# Patient Record
Sex: Female | Born: 1975 | Race: White | Hispanic: No | Marital: Married | Smoking: Current every day smoker
Health system: Southern US, Community
[De-identification: ages and names within clinical notes are randomized; demographics above are authoritative.]

## PROBLEM LIST (undated history)

## (undated) DIAGNOSIS — J45909 Unspecified asthma, uncomplicated: Secondary | ICD-10-CM

## (undated) DIAGNOSIS — F429 Obsessive-compulsive disorder, unspecified: Secondary | ICD-10-CM

## (undated) DIAGNOSIS — G43909 Migraine, unspecified, not intractable, without status migrainosus: Secondary | ICD-10-CM

## (undated) DIAGNOSIS — M545 Low back pain, unspecified: Secondary | ICD-10-CM

## (undated) DIAGNOSIS — F329 Major depressive disorder, single episode, unspecified: Secondary | ICD-10-CM

## (undated) DIAGNOSIS — M1990 Unspecified osteoarthritis, unspecified site: Secondary | ICD-10-CM

## (undated) DIAGNOSIS — M549 Dorsalgia, unspecified: Secondary | ICD-10-CM

## (undated) DIAGNOSIS — F419 Anxiety disorder, unspecified: Secondary | ICD-10-CM

## (undated) DIAGNOSIS — D649 Anemia, unspecified: Secondary | ICD-10-CM

## (undated) DIAGNOSIS — F909 Attention-deficit hyperactivity disorder, unspecified type: Secondary | ICD-10-CM

## (undated) DIAGNOSIS — D759 Disease of blood and blood-forming organs, unspecified: Secondary | ICD-10-CM

## (undated) DIAGNOSIS — F32A Depression, unspecified: Secondary | ICD-10-CM

## (undated) DIAGNOSIS — F319 Bipolar disorder, unspecified: Secondary | ICD-10-CM

## (undated) DIAGNOSIS — K760 Fatty (change of) liver, not elsewhere classified: Secondary | ICD-10-CM

## (undated) DIAGNOSIS — J449 Chronic obstructive pulmonary disease, unspecified: Secondary | ICD-10-CM

## (undated) DIAGNOSIS — M4802 Spinal stenosis, cervical region: Secondary | ICD-10-CM

## (undated) HISTORY — PX: BREAST BIOPSY: SHX20

## (undated) HISTORY — DX: Attention-deficit hyperactivity disorder, unspecified type: F90.9

## (undated) HISTORY — PX: FOOT SURGERY: SHX648

## (undated) HISTORY — PX: TUBAL LIGATION: SHX77

## (undated) HISTORY — PX: NECK SURGERY: SHX720

## (undated) HISTORY — PX: TONSILLECTOMY: SUR1361

---

## 2002-09-18 HISTORY — PX: TUBAL LIGATION: SHX77

## 2005-04-23 ENCOUNTER — Emergency Department (HOSPITAL_COMMUNITY): Admission: EM | Admit: 2005-04-23 | Discharge: 2005-04-23 | Payer: Self-pay | Admitting: Emergency Medicine

## 2006-08-03 ENCOUNTER — Emergency Department (HOSPITAL_COMMUNITY): Admission: EM | Admit: 2006-08-03 | Discharge: 2006-08-03 | Payer: Self-pay | Admitting: Family Medicine

## 2006-08-12 ENCOUNTER — Emergency Department (HOSPITAL_COMMUNITY): Admission: EM | Admit: 2006-08-12 | Discharge: 2006-08-12 | Payer: Self-pay | Admitting: Family Medicine

## 2006-09-20 ENCOUNTER — Emergency Department (HOSPITAL_COMMUNITY): Admission: EM | Admit: 2006-09-20 | Discharge: 2006-09-20 | Payer: Self-pay | Admitting: Emergency Medicine

## 2007-01-10 ENCOUNTER — Ambulatory Visit: Payer: Self-pay | Admitting: Internal Medicine

## 2007-02-06 ENCOUNTER — Encounter: Payer: Self-pay | Admitting: Internal Medicine

## 2007-02-06 ENCOUNTER — Other Ambulatory Visit: Admission: RE | Admit: 2007-02-06 | Discharge: 2007-02-06 | Payer: Self-pay | Admitting: Internal Medicine

## 2007-02-06 ENCOUNTER — Ambulatory Visit: Payer: Self-pay | Admitting: Internal Medicine

## 2007-06-13 DIAGNOSIS — G43809 Other migraine, not intractable, without status migrainosus: Secondary | ICD-10-CM | POA: Insufficient documentation

## 2007-06-13 DIAGNOSIS — Z9189 Other specified personal risk factors, not elsewhere classified: Secondary | ICD-10-CM | POA: Insufficient documentation

## 2007-08-08 ENCOUNTER — Telehealth (INDEPENDENT_AMBULATORY_CARE_PROVIDER_SITE_OTHER): Payer: Self-pay | Admitting: Internal Medicine

## 2008-01-24 ENCOUNTER — Ambulatory Visit: Payer: Self-pay | Admitting: Internal Medicine

## 2008-01-24 DIAGNOSIS — F419 Anxiety disorder, unspecified: Secondary | ICD-10-CM | POA: Insufficient documentation

## 2008-01-24 DIAGNOSIS — F329 Major depressive disorder, single episode, unspecified: Secondary | ICD-10-CM | POA: Insufficient documentation

## 2008-01-24 DIAGNOSIS — N63 Unspecified lump in unspecified breast: Secondary | ICD-10-CM | POA: Insufficient documentation

## 2008-01-24 DIAGNOSIS — F17200 Nicotine dependence, unspecified, uncomplicated: Secondary | ICD-10-CM | POA: Insufficient documentation

## 2008-01-27 ENCOUNTER — Telehealth (INDEPENDENT_AMBULATORY_CARE_PROVIDER_SITE_OTHER): Payer: Self-pay | Admitting: Internal Medicine

## 2008-02-27 ENCOUNTER — Other Ambulatory Visit: Admission: RE | Admit: 2008-02-27 | Discharge: 2008-02-27 | Payer: Self-pay | Admitting: Internal Medicine

## 2008-02-27 ENCOUNTER — Ambulatory Visit: Payer: Self-pay | Admitting: Internal Medicine

## 2008-02-27 ENCOUNTER — Encounter (INDEPENDENT_AMBULATORY_CARE_PROVIDER_SITE_OTHER): Payer: Self-pay | Admitting: Internal Medicine

## 2008-02-27 LAB — CONVERTED CEMR LAB
Blood in Urine, dipstick: NEGATIVE
Chlamydia, DNA Probe: NEGATIVE
GC Probe Amp, Genital: NEGATIVE
Glucose, Urine, Semiquant: NEGATIVE
Nitrite: NEGATIVE
Protein, U semiquant: NEGATIVE
Specific Gravity, Urine: >=1.03
Urobilinogen, UA: 0.2
WBC Urine, dipstick: NEGATIVE
pH: 6

## 2008-03-05 ENCOUNTER — Encounter (INDEPENDENT_AMBULATORY_CARE_PROVIDER_SITE_OTHER): Payer: Self-pay | Admitting: Internal Medicine

## 2010-10-18 NOTE — Assessment & Plan Note (Signed)
Summary: PER DR Leontyne Manville / F/U CPP EXAM//GK   Vital Signs:  Patient Profile:   35 Years Old Female LMP:     02/18/2008 Weight:      154 pounds Temp:     98.1 degrees F Pulse rate:   84 / minute Pulse rhythm:   regular Resp:     18 per minute BP sitting:   126 / 84  (left arm) Cuff size:   regular  Pt. in pain?   no  Vitals Entered By: Vesta Mixer CMA (February 27, 2008 3:47 PM)  Menstrual History: LMP (date): 02/18/2008              Is Patient Diabetic? No  Does patient need assistance? Ambulation Normal Comments out of klonipin      Chief Complaint:  CPP.  History of Present Illness: 35 yo female here for CPP  Concerns:  1.  Tobacco Cessation:  continues on Wellbutrin, but hasn't quit smoking as her sister in law came down to visit --she smokes and was waiting until she left, which is tomorrow.  Still taking Klonopin in the a.m.--anxiety related.  2.  Anxiety:  as above--would be willing to consider counseling or more.  Pt. concerned as she is apparently making too much money to be a patient here.   Health Maintenance: 1.  Last Pap: 02/06/07  normal 2.  SBE:  not performed 3.  Osteoprevention:  2-3 servings of dairy daily.  On feet all day, but no other exercise. 4.  Immunizations:    Current Allergies: No known allergies   Past Medical History:    Reviewed history and no changes required:              DEPRESSIVE DISORDER NOT ELSEWHERE CLASSIFIED (ICD-311)       TOBACCO ABUSE (ICD-305.1)       BREAST MASS (ICD-611.72)       INSOMNIA, HX OF (ICD-V15.89)       MIGRAINE VARIANT (ICD-346.20)         Past Surgical History:    Reviewed history and no changes required:       1.  Tonsillectomy and Adenoidectomy age 33       2.  2004:  BTL   Family History:    Mother 16:  DM, depression    Father, Unknown    No siblings    2 children :  51 yo son with asthma    Daughter, age 2, healthy  Social History:    Lives at home with husband and 2 children.     Married in 2000    Works at [employer].      Husband works odd jobs in [employer].   Risk Factors:     Year started:  started age 11    Counseled to quit/cut down tobacco use:  yes Passive smoke exposure:  yes Drug use:  no Alcohol use:  yes    Type:  beer    Drinks per day:  2    Comments:  encouraged stopping while on psychotropics    Counseled to quit/cut down alcohol use:  yes Exercise:  no Seatbelt use:  100 %   Review of Systems  General      poor energy  Eyes      Denies blurring.  ENT      Denies decreased hearing.  CV      Complains of palpitations.      Denies chest pain or discomfort.  Sometimes heart feels like it's racing--generally when really stressed.  Resp      Denies shortness of breath.  GI      Denies abdominal pain, bloody stools, constipation, dark tarry stools, and diarrhea.  GU      Denies abnormal vaginal bleeding, discharge, dysuria, and urinary frequency.      regular periods  MS      Denies joint pain.  Derm      Denies lesion(s) and rash.  Neuro      Denies numbness, tingling, and weakness.  Psych      Complains of anxiety and depression.   Physical Exam  General:     Well-developed,well-nourished,in no acute distress; alert,appropriate and cooperative throughout examination Head:     Normocephalic and atraumatic without obvious abnormalities. No apparent alopecia or balding. Eyes:     No corneal or conjunctival inflammation noted. EOMI. Perrla. Funduscopic exam benign, without hemorrhages, exudates or papilledema. Vision grossly normal. Ears:     External ear exam shows no significant lesions or deformities.  Otoscopic examination reveals clear canals, tympanic membranes are intact bilaterally without bulging, retraction, inflammation or discharge. Hearing is grossly normal bilaterally. Nose:     External nasal examination shows no deformity or inflammation. Nasal mucosa are pink and moist without lesions or  exudates. Mouth:     pharynx pink and moist and fair dentition.   Neck:     No deformities, masses, or tenderness noted. Breasts:     No mass, nodules, thickening, tenderness, bulging, retraction, inflamation, nipple discharge or skin changes noted.   Lungs:     Normal respiratory effort, chest expands symmetrically. Lungs are clear to auscultation, no crackles or wheezes. Heart:     Normal rate and regular rhythm. S1 and S2 normal without gallop, murmur, click, rub or other extra sounds. Abdomen:     Bowel sounds positive,abdomen soft and non-tender without masses, organomegaly or hernias noted. Rectal:     deferred Genitalia:     Normal introitus for age, no external lesions, no vaginal discharge, mucosa pink and moist, no vaginal or cervical lesions, no vaginal atrophy, no friaility or hemorrhage, normal uterus size and position, no adnexal masses or tenderness Msk:     No deformity or scoliosis noted of thoracic or lumbar spine.   Pulses:     R and L carotid,radial,femoral,dorsalis pedis and posterior tibial pulses are full and equal bilaterally Extremities:     No clubbing, cyanosis, edema, or deformity noted with normal full range of motion of all joints.   Neurologic:     No cranial nerve deficits noted. Station and gait are normal. Plantar reflexes are down-going bilaterally. DTRs are symmetrical throughout. Sensory, motor and coordinative functions appear intact. Skin:     Intact without suspicious lesions or rashes Cervical Nodes:     No lymphadenopathy noted Axillary Nodes:     No palpable lymphadenopathy Inguinal Nodes:     No significant adenopathy Psych:     Cognition and judgment appear intact. Alert and cooperative with normal attention span and concentration. No apparent delusions, illusions, hallucinations    Impression & Recommendations:  Problem # 1:  HEALTH MAINTENANCE EXAM (ICD-V70.0) SBE monthly 3 servings dairy daily exercise. CBC, CMET, FLP all  okay 1/09 Orders: KOH/ WET [place] [phone]) Pap Smear, Thin Prep ( Collection of) (U0454) UA Dipstick w/o Micro (manual) (09811) T- GC Chlamydia (91478) T-Pap Smear, Thin Prep (G9562)   Problem # 2:  TOBACCO ABUSE (ICD-305.1) Needs to pick a  date and stop.  Problem # 3:  DEPRESSIVE DISORDER NOT ELSEWHERE CLASSIFIED (ICD-311) Lots of anxiety--will fill Klonopin only once more--15 tabs.  Discussed no more after this referral to [facility] for counseling. The following medications were removed from the medication list:    Amitriptyline Hcl 25 Mg Tabs (Amitriptyline hcl)  Her updated medication list for this problem includes:    Wellbutrin Xl 150 Mg Tb24 (Bupropion hcl) .[name]... 1 cap by mouth for 3 days, then increase to 2 caps by mouth daily    Klonopin 1 Mg Tabs (Clonazepam) .[name]... 1 tab by mouth two times a day as needed anxiety   Complete Medication List: 1)  Wellbutrin Xl 150 Mg Tb24 (Bupropion hcl) .[name].. 1 cap by mouth for 3 days, then increase to 2 caps by mouth daily 2)  Klonopin 1 Mg Tabs (Clonazepam) .[name].. 1 tab by mouth two times a day as needed anxiety    Prescriptions: KLONOPIN 1 MG  TABS (CLONAZEPAM) 1 tab by mouth two times a day as needed anxiety  #15 x 0   Entered and Authorized by:   Julieanne Manson MD   Signed by:   Julieanne Manson MD on 02/27/2008   Method used:   Print then Give to Patient   RxID:   [id number]  ] Laboratory Results   Urine Tests    Routine Urinalysis   Color: lt. yellow Glucose: negative   (Normal Range: Negative) Bilirubin: small   (Normal Range: Negative) Ketone: trace (5)   (Normal Range: Negative) Spec. Gravity: >=1.030   (Normal Range: 1.003-1.035) Blood: negative   (Normal Range: Negative) pH: 6.0   (Normal Range: 5.0-8.0) Protein: negative   (Normal Range: Negative) Urobilinogen: 0.2   (Normal Range: 0-1) Nitrite: negative   (Normal Range: Negative) Leukocyte Esterace: negative   (Normal Range: Negative)

## 2010-10-18 NOTE — Assessment & Plan Note (Signed)
Summary: PAINFUL BREAST////RJP   Vital Signs:  Patient Profile:   35 Years Old Female LMP:     01/19/2008 Weight:      150 pounds Temp:     97.1 degrees F Pulse rate:   76 / minute Pulse rhythm:   regular Resp:     20 per minute BP sitting:   126 / 80  (left arm) Cuff size:   regular  Pt. in pain?   yes    Location:   head    Intensity:   9  Vitals Entered By: Vesta Mixer CMA (Jan 24, 2008 4:03 PM)  Menstrual History: LMP (date): 01/19/2008              Is Patient Diabetic? No  Does patient need assistance? Ambulation Normal     Chief Complaint:  Wants to quit smoking and and painful breast for about 1 week.  History of Present Illness: 1.  Smoking Cessation:  Has smoked since age 30 yo.  Smokes 1 1/2 ppd.  Wants to quit.  Has done patches before, but did not work--wasn't ready to quit.  Pt. possibly diagnosed as bipolar disorder when 11, but states has only had depressive symptoms--never manic symptoms.  Does have anxiety associated.  Has not taken meds for some time.  Tried Xanax in past.  Was on Zoloft at one time--she got fat and then pregnant and was taken off.  Does drink beer--2 at night after home from a double shift.  Has been given Chantix before, but husband did not want her to take because of concern for psych side effects with her history.  2.  Bilateral breasts with lumpiness--states noted by Dr. Reche Dixon previously--he has documented "dense tissue"  in 02/06/07.      Current Allergies: No known allergies     Risk Factors:  Tobacco use:  current    Cigarettes:  Yes -- 1 pack(s) per day    Physical Exam  Breasts:     No focal mass, skin dimpling, nipple discharge or axillary adenopathy.  Normal glandular tissue where pt. points out lateral right breast as a concern and generalized lumpiness in upper outer quadrant of left breast, but no focal mass as above. Lungs:     CTA    Impression & Recommendations:  Problem # 1:  BREAST MASS  (ICD-611.72) Concern not found--probable mild fibrocystic disease in left breast and normal tissue on right Pt. to make appt for CPE--to see if can set up with 1 month follow up if available.  Problem # 2:  TOBACCO ABUSE (ICD-305.1) Wellbutrin XL 150 mg once daily for 3 days, then increase to 300 mg daily See pt. instructions. Pt. or spouse to notify if manic symptoms with use. Klonopin if significantly increased anxiety. Follow up in 1 month   The following medications were removed from the medication list:    Chantix 0.5 Mg Tabs (Varenicline tartrate)   Complete Medication List: 1)  Amitriptyline Hcl 25 Mg Tabs (Amitriptyline hcl) 2)  Cyclobenzaprine Hcl 5 Mg Tabs (Cyclobenzaprine hcl) 3)  Wellbutrin Xl 150 Mg Tb24 (Bupropion hcl) .[name].. 1 cap by mouth for 3 days, then increase to 2 caps by mouth daily 4)  Klonopin 1 Mg Tabs (Clonazepam) .[name].. 1 tab by mouth two times a day as needed anxiety   Patient Instructions: 1)  Pick a day between days 8 and 14 after starting Wellbutrin to stop smoking--then STOP 2)  Call for follow up with Dr. Delrae Alfred in  4 weeks. 3)  Use Klonopin only if increased anxiety with Wellbutrin.  See if complete physical available for same appt.   Prescriptions: KLONOPIN 1 MG  TABS (CLONAZEPAM) 1 tab by mouth two times a day as needed anxiety  #30 x 0   Entered and Authorized by:   Julieanne Manson MD   Signed by:   Julieanne Manson MD on 01/24/2008   Method used:   Print then Give to Patient   RxID:   [id number] WELLBUTRIN XL 150 MG  TB24 (BUPROPION HCL) 1 cap by mouth for 3 days, then increase to 2 caps by mouth daily  #60 x 1   Entered and Authorized by:   Julieanne Manson MD   Signed by:   Julieanne Manson MD on 01/24/2008   Method used:   Electronically sent to ...       [facility] [address]       [address] [place], [place]  [address]       Ph: [phone]       Fax: [phone]   RxID:    [id number]  ]

## 2011-06-25 ENCOUNTER — Emergency Department (HOSPITAL_COMMUNITY)
Admission: EM | Admit: 2011-06-25 | Discharge: 2011-06-25 | Disposition: A | Discharge status: Home / Self Care | Payer: Self-pay | Attending: Emergency Medicine | Admitting: Emergency Medicine

## 2011-06-25 DIAGNOSIS — F3289 Other specified depressive episodes: Secondary | ICD-10-CM | POA: Insufficient documentation

## 2011-06-25 DIAGNOSIS — X789XXA Intentional self-harm by unspecified sharp object, initial encounter: Secondary | ICD-10-CM | POA: Insufficient documentation

## 2011-06-25 DIAGNOSIS — S1190XA Unspecified open wound of unspecified part of neck, initial encounter: Secondary | ICD-10-CM | POA: Insufficient documentation

## 2011-06-25 DIAGNOSIS — F329 Major depressive disorder, single episode, unspecified: Secondary | ICD-10-CM | POA: Insufficient documentation

## 2011-06-25 DIAGNOSIS — Z23 Encounter for immunization: Secondary | ICD-10-CM | POA: Insufficient documentation

## 2011-06-25 LAB — URINALYSIS, ROUTINE W REFLEX MICROSCOPIC
Bilirubin Urine: NEGATIVE
Glucose, UA: NEGATIVE mg/dL
Ketones, ur: NEGATIVE mg/dL
Leukocytes, UA: NEGATIVE
Nitrite: NEGATIVE
Protein, ur: NEGATIVE mg/dL
Specific Gravity, Urine: 1.01 (ref 1.005–1.030)
Urobilinogen, UA: 0.2 mg/dL (ref 0.0–1.0)
pH: 5.5 (ref 5.0–8.0)

## 2011-06-25 LAB — CBC
HCT: 41.2 % (ref 36.0–46.0)
Hemoglobin: 14.5 g/dL (ref 12.0–15.0)
MCH: 31.5 pg (ref 26.0–34.0)
MCHC: 35.2 g/dL (ref 30.0–36.0)
MCV: 89.4 fL (ref 78.0–100.0)
Platelets: 297 10*3/uL (ref 150–400)
RBC: 4.61 MIL/uL (ref 3.87–5.11)
RDW: 12 % (ref 11.5–15.5)
WBC: 10.7 10*3/uL — ABNORMAL HIGH (ref 4.0–10.5)

## 2011-06-25 LAB — DIFFERENTIAL
Basophils Absolute: 0.1 10*3/uL (ref 0.0–0.1)
Basophils Relative: 1 % (ref 0–1)
Eosinophils Absolute: 0.1 10*3/uL (ref 0.0–0.7)
Eosinophils Relative: 1 % (ref 0–5)
Lymphocytes Relative: 31 % (ref 12–46)
Lymphs Abs: 3.3 10*3/uL (ref 0.7–4.0)
Monocytes Absolute: 0.5 10*3/uL (ref 0.1–1.0)
Monocytes Relative: 5 % (ref 3–12)
Neutro Abs: 6.8 10*3/uL (ref 1.7–7.7)
Neutrophils Relative %: 63 % (ref 43–77)

## 2011-06-25 LAB — COMPREHENSIVE METABOLIC PANEL
ALT: 16 U/L (ref 0–35)
AST: 25 U/L (ref 0–37)
Albumin: 4.2 g/dL (ref 3.5–5.2)
Alkaline Phosphatase: 103 U/L (ref 39–117)
BUN: 10 mg/dL (ref 6–23)
CO2: 23 mEq/L (ref 19–32)
Calcium: 9.3 mg/dL (ref 8.4–10.5)
Chloride: 103 mEq/L (ref 96–112)
Creatinine, Ser: 0.59 mg/dL (ref 0.50–1.10)
GFR calc Af Amer: >90 mL/min (ref >90)
GFR calc non Af Amer: >90 mL/min (ref >90)
Glucose, Bld: 123 mg/dL — ABNORMAL HIGH (ref 70–99)
Potassium: 3.7 mEq/L (ref 3.5–5.1)
Sodium: 139 mEq/L (ref 135–145)
Total Bilirubin: 0.2 mg/dL — ABNORMAL LOW (ref 0.3–1.2)
Total Protein: 7.5 g/dL (ref 6.0–8.3)

## 2011-06-25 LAB — SALICYLATE LEVEL: Salicylate Lvl: <2 mg/dL — ABNORMAL LOW (ref 2.8–20.0)

## 2011-06-25 LAB — RAPID URINE DRUG SCREEN, HOSP PERFORMED
Amphetamines: NOT DETECTED
Barbiturates: NOT DETECTED
Benzodiazepines: NOT DETECTED
Cocaine: NOT DETECTED
Opiates: NOT DETECTED
Tetrahydrocannabinol: POSITIVE — AB

## 2011-06-25 LAB — POCT PREGNANCY, URINE: Preg Test, Ur: NEGATIVE

## 2011-06-25 LAB — URINE MICROSCOPIC-ADD ON

## 2011-06-25 LAB — ETHANOL: Alcohol, Ethyl (B): <11 mg/dL (ref 0–11)

## 2011-06-25 LAB — ACETAMINOPHEN LEVEL: Acetaminophen (Tylenol), Serum: <15 ug/mL (ref 10–30)

## 2011-08-14 ENCOUNTER — Emergency Department (HOSPITAL_COMMUNITY): Payer: Self-pay

## 2011-08-14 ENCOUNTER — Emergency Department (HOSPITAL_COMMUNITY)
Admission: EM | Admit: 2011-08-14 | Discharge: 2011-08-15 | Disposition: A | Discharge status: Home / Self Care | Payer: Self-pay | Attending: Emergency Medicine | Admitting: Emergency Medicine

## 2011-08-14 ENCOUNTER — Encounter: Payer: Self-pay | Admitting: *Deleted

## 2011-08-14 DIAGNOSIS — S335XXA Sprain of ligaments of lumbar spine, initial encounter: Secondary | ICD-10-CM | POA: Insufficient documentation

## 2011-08-14 DIAGNOSIS — S161XXA Strain of muscle, fascia and tendon at neck level, initial encounter: Secondary | ICD-10-CM

## 2011-08-14 DIAGNOSIS — M545 Low back pain, unspecified: Secondary | ICD-10-CM | POA: Insufficient documentation

## 2011-08-14 DIAGNOSIS — Z79899 Other long term (current) drug therapy: Secondary | ICD-10-CM | POA: Insufficient documentation

## 2011-08-14 DIAGNOSIS — S139XXA Sprain of joints and ligaments of unspecified parts of neck, initial encounter: Secondary | ICD-10-CM | POA: Insufficient documentation

## 2011-08-14 DIAGNOSIS — S39012A Strain of muscle, fascia and tendon of lower back, initial encounter: Secondary | ICD-10-CM

## 2011-08-14 DIAGNOSIS — M542 Cervicalgia: Secondary | ICD-10-CM | POA: Insufficient documentation

## 2011-08-14 DIAGNOSIS — F341 Dysthymic disorder: Secondary | ICD-10-CM | POA: Insufficient documentation

## 2011-08-14 HISTORY — DX: Migraine, unspecified, not intractable, without status migrainosus: G43.909

## 2011-08-14 HISTORY — DX: Anxiety disorder, unspecified: F41.9

## 2011-08-14 HISTORY — DX: Bipolar disorder, unspecified: F31.9

## 2011-08-14 MED ORDER — HYDROMORPHONE HCL PF 2 MG/ML IJ SOLN
2.0000 mg | Freq: Once | INTRAMUSCULAR | Status: AC
  Administered 2011-08-14: 2 mg via INTRAMUSCULAR
  Filled 2011-08-14: qty 1

## 2011-08-14 MED ORDER — OXYCODONE-ACETAMINOPHEN 5-325 MG PO TABS
2.0000 | ORAL_TABLET | Freq: Once | ORAL | Status: AC
  Administered 2011-08-14: 2 via ORAL
  Filled 2011-08-14: qty 2
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
## 2011-08-14 NOTE — ED Notes (Signed)
Pt medicated, states 10/10 lower back pain. Vital signs stable. Pt returned to PTO hallway from x-ray. Family at bedside. No signs of distress at present.

## 2011-08-14 NOTE — ED Notes (Signed)
Pt moved to room pto5. Care assumed.

## 2011-08-15 ENCOUNTER — Encounter (HOSPITAL_COMMUNITY): Payer: Self-pay | Admitting: *Deleted

## 2011-11-07 ENCOUNTER — Encounter (HOSPITAL_COMMUNITY): Payer: Self-pay | Admitting: *Deleted

## 2011-11-07 ENCOUNTER — Emergency Department (INDEPENDENT_AMBULATORY_CARE_PROVIDER_SITE_OTHER)
Admission: EM | Admit: 2011-11-07 | Discharge: 2011-11-07 | Disposition: A | Discharge status: Home / Self Care | Payer: Medicaid Other | Source: Home / Self Care | Attending: Family Medicine | Admitting: Family Medicine

## 2011-11-07 DIAGNOSIS — R059 Cough, unspecified: Secondary | ICD-10-CM

## 2011-11-07 DIAGNOSIS — L84 Corns and callosities: Secondary | ICD-10-CM

## 2011-11-07 DIAGNOSIS — R05 Cough: Secondary | ICD-10-CM

## 2011-11-07 MED ORDER — AMOXICILLIN 500 MG PO CAPS: 500.0000 mg | ORAL_CAPSULE | Freq: Three times a day (TID) | ORAL | Status: AC

## 2011-11-07 MED ORDER — DOXYCYCLINE HYCLATE 100 MG PO TABS: 100.0000 mg | ORAL_TABLET | Freq: Two times a day (BID) | ORAL | Status: DC

## 2011-11-07 MED ORDER — GUAIFENESIN-CODEINE 100-10 MG/5ML PO SYRP: 5.0000 mL | ORAL_SOLUTION | Freq: Four times a day (QID) | ORAL | Status: AC | PRN

## 2011-11-07 NOTE — ED Provider Notes (Signed)
History     CSN: [id number]  Arrival date & time 11/07/11  1527   First MD Initiated Contact with Patient 11/07/11 1638      Chief Complaint  Patient presents with  . Cough    (Consider location/radiation/quality/duration/timing/severity/associated sxs/prior treatment) HPI Comments: Nancy Bowers presents for evaluation for multiple complaints. She first points out a callus on the arch of her right foot. She reports it has been there for many weeks. She's been applying over-the-counter corn pads to it with minimal improvement. She also reports a persistent nonproductive cough over the last 2 weeks. She reports that she coughs so hard sometimes that she sees blood and/or vomits. She denies any fever, nasal congestion, or rhinorrhea. She does smoke upwards of a pack of cigarettes a day. She also has been evaluated for chronic migraines and chronic back pain for which is followed by local provider. She is advised to followup with this provider. She also has been using an inhaler that she has been provided in the past.  Patient is a 36 y.o. female presenting with cough and lower extremity pain. The history is provided by the patient.  Cough This is a new problem. The current episode started more than 1 week ago. The problem occurs constantly. The problem has not changed since onset.There has been no fever. Associated symptoms include chest pain, headaches and wheezing. Pertinent negatives include no chills and no rhinorrhea. She is a smoker.  Foot Pain This is a new problem. The current episode started more than 1 week ago. Associated symptoms include chest pain and headaches. The symptoms are aggravated by walking. The symptoms are relieved by nothing.    Past Medical History  Diagnosis Date  . Migraine headache   . Manic depression   . Anxiety     Past Surgical History  Procedure Date  . Tonsillectomy   . Tubal ligation     History reviewed. No pertinent family history.  History    Substance Use Topics  . Smoking status: Current Everyday Smoker -- 1.0 packs/day  . Smokeless tobacco: Not on file  . Alcohol Use: 1.8 oz/week    3 Cans of beer per week    OB History    Grav Para Term Preterm Abortions TAB SAB Ect Mult Living                  Review of Systems  Constitutional: Negative.  Negative for chills.  HENT: Negative for congestion and rhinorrhea.   Eyes: Negative.   Respiratory: Positive for cough and wheezing.   Cardiovascular: Positive for chest pain.  Gastrointestinal: Negative.   Genitourinary: Negative.   Musculoskeletal: Positive for back pain.  Skin: Negative.        Plantar wart   Neurological: Positive for headaches.    Allergies  Review of patient's allergies indicates no known allergies.  Home Medications   Current Outpatient Rx  Name Route Sig Dispense Refill  . ALPRAZOLAM 1 MG PO TABS Oral Take 2 mg by mouth 4 (four) times daily.      [name] CARISOPRODOL 350 MG PO TABS Oral Take 350 mg by mouth at bedtime.      [name] CITALOPRAM HYDROBROMIDE 40 MG PO TABS Oral Take 60 mg by mouth daily.      [name] CLONAZEPAM 1 MG PO TABS Oral Take 1 mg by mouth 2 (two) times daily.      [name] DOXYCYCLINE HYCLATE 100 MG PO TABS Oral Take 1 tablet (100 mg total) by mouth  2 (two) times daily. 20 tablet 0  . GUAIFENESIN-CODEINE 100-10 MG/5ML PO SYRP Oral Take 5 mLs by mouth every 6 (six) hours as needed for cough or congestion. 120 mL 0  . OXYCODONE-ACETAMINOPHEN 10-325 MG PO TABS Oral Take 1 tablet by mouth 4 (four) times daily.      . QUETIAPINE FUMARATE 300 MG PO TABS Oral Take 300 mg by mouth at bedtime.      . TOPIRAMATE 25 MG PO TABS Oral Take 25 mg by mouth 2 (two) times daily.        BP 123/89  Pulse 88  Temp(Src) 98.4 F (36.9 C) (Oral)  Resp 20  SpO2 97%  LMP 11/07/2011  Physical Exam  Nursing note and vitals reviewed. Constitutional: She is oriented to person, place, and time. She appears well-developed and well-nourished.  HENT:  Head:  Normocephalic and atraumatic.  Right Ear: Tympanic membrane normal.  Left Ear: Tympanic membrane normal.  Mouth/Throat: Uvula is midline, oropharynx is clear and moist and mucous membranes are normal.  Eyes: EOM are normal.  Neck: Normal range of motion.  Pulmonary/Chest: Effort normal and breath sounds normal. She has no decreased breath sounds. She has no wheezes. She has no rhonchi.  Musculoskeletal: Normal range of motion.       Feet:  Neurological: She is alert and oriented to person, place, and time.  Skin: Skin is warm and dry.  Psychiatric: Her behavior is normal.    ED Course  Procedures (including critical care time)  Labs Reviewed - No data to display No results found.   1. Callus of foot   2. Cough       MDM  Given doxycycline, guaifenesin AC, advised to continue albuterol inhaler; given [facility] information; advised to use Medi-plast or duct tape;         Richardo Priest, MD 11/07/11 1730

## 2011-11-07 NOTE — Discharge Instructions (Signed)
Use albuterol every 4 hours for the next 24 hours, then as needed. Take antibiotics as directed. Use cough syrup as needed and as directed. I recommend aggressive fever control with acetaminophen (Tylenol) and/or ibuprofen. You may use these together, alternating them every 4 hours, or individually, every 8 hours. For example, take acetaminophen 500 to 1000 mg at 12 noon, then 600 to 800 mg of ibuprofen at 4 pm, then acetaminophen at 8 pm, etc. Also, stay hydrated with clear liquids. Return to care should your symptoms not improve, or worsen in any way. STOP SMOKING!  For your foot callus, you may try an over the counter product called Medi-plast; use as directed; this may take several weeks. There is controversial evidence regarding the use of duct tape. In a few studies, people applied the silver duct tape for cycles of 3 to 6 days, removing the duct tape, soaking the callus or wart, then debriding (filing the skin) with an emery board or pumice stone. This again takes several weeks. If no improvement in symptoms or resolution with these regimens, seek a podiatrist (foot doctor). I will provide one local office's information.   Follow up with your regular doctors regarding management of your chronic conditions.

## 2011-11-07 NOTE — ED Notes (Signed)
Pt  Reports  Symptoms  Of  Cough  /  Congested     Body  Aches   X  Several  Weeks   She  Also  Reports    Symptoms  Of  Anxiety    Headache  Back pain  And  A  Lump  On her r  Foot    -  She  Appears  In no  Distress  She  Is  Speaking in  Complete  sentances

## 2011-11-08 ENCOUNTER — Emergency Department (HOSPITAL_COMMUNITY)
Admission: EM | Admit: 2011-11-08 | Discharge: 2011-11-08 | Disposition: A | Discharge status: Home / Self Care | Payer: Self-pay | Attending: Emergency Medicine | Admitting: Emergency Medicine

## 2011-11-08 ENCOUNTER — Encounter (HOSPITAL_COMMUNITY): Payer: Self-pay

## 2011-11-08 DIAGNOSIS — F17200 Nicotine dependence, unspecified, uncomplicated: Secondary | ICD-10-CM | POA: Insufficient documentation

## 2011-11-08 DIAGNOSIS — M545 Low back pain, unspecified: Secondary | ICD-10-CM | POA: Insufficient documentation

## 2011-11-08 DIAGNOSIS — R51 Headache: Secondary | ICD-10-CM | POA: Insufficient documentation

## 2011-11-08 DIAGNOSIS — G8929 Other chronic pain: Secondary | ICD-10-CM | POA: Insufficient documentation

## 2011-11-08 DIAGNOSIS — M549 Dorsalgia, unspecified: Secondary | ICD-10-CM

## 2011-11-08 DIAGNOSIS — B070 Plantar wart: Secondary | ICD-10-CM | POA: Insufficient documentation

## 2011-11-08 DIAGNOSIS — M79609 Pain in unspecified limb: Secondary | ICD-10-CM | POA: Insufficient documentation

## 2011-11-08 MED ORDER — IBUPROFEN 800 MG PO TABS
800.0000 mg | ORAL_TABLET | Freq: Once | ORAL | Status: AC
  Administered 2011-11-08: 800 mg via ORAL
  Filled 2011-11-08: qty 1

## 2011-11-08 MED ORDER — ONDANSETRON 4 MG PO TBDP
8.0000 mg | ORAL_TABLET | ORAL | Status: AC
  Administered 2011-11-08: 8 mg via ORAL
  Filled 2011-11-08: qty 2
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
## 2011-11-08 NOTE — Discharge Instructions (Signed)
Return to the ED with any concerns including fever, difficulty breathing, weakness of your legs, not able to urinate, loss of control of your bowel or bladder, or any other alarming symptoms.  He should be sure to arrange for followup with your primary Dr. for management of your pain.

## 2012-01-19 ENCOUNTER — Other Ambulatory Visit: Payer: Self-pay | Admitting: Physician Assistant

## 2012-01-19 ENCOUNTER — Ambulatory Visit
Admission: RE | Admit: 2012-01-19 | Discharge: 2012-01-19 | Disposition: A | Discharge status: Home / Self Care | Payer: Medicaid Other | Source: Ambulatory Visit | Attending: Physician Assistant | Admitting: Physician Assistant

## 2012-01-19 DIAGNOSIS — M545 Low back pain, unspecified: Secondary | ICD-10-CM

## 2012-04-08 ENCOUNTER — Emergency Department (HOSPITAL_COMMUNITY): Payer: Medicaid Other

## 2012-04-08 ENCOUNTER — Emergency Department (HOSPITAL_COMMUNITY)
Admission: EM | Admit: 2012-04-08 | Discharge: 2012-04-08 | Disposition: A | Discharge status: Home / Self Care | Payer: Medicaid Other | Attending: Emergency Medicine | Admitting: Emergency Medicine

## 2012-04-08 ENCOUNTER — Encounter (HOSPITAL_COMMUNITY): Payer: Self-pay | Admitting: Emergency Medicine

## 2012-04-08 DIAGNOSIS — Z79899 Other long term (current) drug therapy: Secondary | ICD-10-CM | POA: Insufficient documentation

## 2012-04-08 DIAGNOSIS — F319 Bipolar disorder, unspecified: Secondary | ICD-10-CM | POA: Insufficient documentation

## 2012-04-08 DIAGNOSIS — M542 Cervicalgia: Secondary | ICD-10-CM | POA: Insufficient documentation

## 2012-04-08 DIAGNOSIS — M545 Low back pain, unspecified: Secondary | ICD-10-CM | POA: Insufficient documentation

## 2012-04-08 DIAGNOSIS — F411 Generalized anxiety disorder: Secondary | ICD-10-CM | POA: Insufficient documentation

## 2012-04-08 DIAGNOSIS — F17200 Nicotine dependence, unspecified, uncomplicated: Secondary | ICD-10-CM | POA: Insufficient documentation

## 2012-04-08 MED ORDER — OXYCODONE-ACETAMINOPHEN 5-325 MG PO TABS
2.0000 | ORAL_TABLET | Freq: Once | ORAL | Status: AC
  Administered 2012-04-08: 2 via ORAL
  Filled 2012-04-08: qty 2

## 2012-04-08 MED ORDER — DIAZEPAM 5 MG PO TABS: 5.0000 mg | ORAL_TABLET | Freq: Two times a day (BID) | ORAL | Status: AC

## 2012-04-08 MED ORDER — HYDROCODONE-ACETAMINOPHEN 5-325 MG PO TABS: 2.0000 | ORAL_TABLET | ORAL | Status: AC | PRN

## 2012-04-08 NOTE — ED Notes (Signed)
Collected urine at 9:45am. Waiting for order to process
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
## 2012-04-08 NOTE — ED Notes (Signed)
PA at bedside.

## 2012-04-08 NOTE — ED Notes (Signed)
Attempted to talk to pt she will not come off the phone
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
## 2012-04-08 NOTE — ED Notes (Signed)
Pt returned from xray

## 2012-04-12 NOTE — ED Provider Notes (Signed)
Medical screening examination/treatment/procedure(s) were performed by non-physician practitioner and as supervising physician I was immediately available for consultation/collaboration.  Flint Melter, MD 04/12/12 [phone]

## 2012-04-16 ENCOUNTER — Other Ambulatory Visit: Payer: Self-pay | Admitting: Family Medicine

## 2012-04-16 DIAGNOSIS — M545 Low back pain, unspecified: Secondary | ICD-10-CM

## 2012-04-20 ENCOUNTER — Ambulatory Visit
Admission: RE | Admit: 2012-04-20 | Discharge: 2012-04-20 | Disposition: A | Discharge status: Home / Self Care | Payer: Medicaid Other | Source: Ambulatory Visit | Attending: Family Medicine | Admitting: Family Medicine

## 2012-04-20 DIAGNOSIS — M545 Low back pain, unspecified: Secondary | ICD-10-CM

## 2012-04-27 ENCOUNTER — Encounter (HOSPITAL_COMMUNITY): Payer: Self-pay

## 2012-04-27 ENCOUNTER — Emergency Department (HOSPITAL_COMMUNITY)
Admission: EM | Admit: 2012-04-27 | Discharge: 2012-04-27 | Disposition: A | Discharge status: Home / Self Care | Payer: Medicaid Other | Source: Home / Self Care | Attending: Emergency Medicine | Admitting: Emergency Medicine

## 2012-04-27 DIAGNOSIS — M545 Low back pain, unspecified: Secondary | ICD-10-CM

## 2012-04-27 HISTORY — DX: Obsessive-compulsive disorder, unspecified: F42.9

## 2012-04-27 HISTORY — DX: Low back pain: M54.5

## 2012-04-27 HISTORY — DX: Chronic obstructive pulmonary disease, unspecified: J44.9

## 2012-04-27 HISTORY — DX: Low back pain, unspecified: M54.50

## 2012-04-27 MED ORDER — KETOROLAC TROMETHAMINE 30 MG/ML IJ SOLN
INTRAMUSCULAR | Status: AC
  Filled 2012-04-27: qty 1

## 2012-04-27 MED ORDER — KETOROLAC TROMETHAMINE 30 MG/ML IJ SOLN
60.0000 mg | Freq: Once | INTRAMUSCULAR | Status: AC
  Administered 2012-04-27: 60 mg via INTRAMUSCULAR

## 2012-04-27 MED ORDER — MELOXICAM 15 MG PO TABS: 15.0000 mg | ORAL_TABLET | Freq: Every day | ORAL | Status: DC

## 2012-04-27 MED ORDER — PREDNISONE (PAK) 10 MG PO TABS: ORAL_TABLET | ORAL | Status: DC

## 2012-04-27 NOTE — ED Notes (Signed)
Pt has low back pain that has been occurring for five years, she had an MRI two days ago and states the hydrocodone is not relieving her pain.
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
## 2012-05-08 ENCOUNTER — Encounter (HOSPITAL_COMMUNITY): Payer: Self-pay | Admitting: *Deleted

## 2012-05-08 ENCOUNTER — Emergency Department (HOSPITAL_COMMUNITY)
Admission: EM | Admit: 2012-05-08 | Discharge: 2012-05-08 | Disposition: A | Discharge status: Home / Self Care | Payer: No Typology Code available for payment source | Attending: Emergency Medicine | Admitting: Emergency Medicine

## 2012-05-08 ENCOUNTER — Emergency Department (HOSPITAL_COMMUNITY): Payer: No Typology Code available for payment source

## 2012-05-08 DIAGNOSIS — S39012A Strain of muscle, fascia and tendon of lower back, initial encounter: Secondary | ICD-10-CM

## 2012-05-08 DIAGNOSIS — F17200 Nicotine dependence, unspecified, uncomplicated: Secondary | ICD-10-CM | POA: Insufficient documentation

## 2012-05-08 DIAGNOSIS — F411 Generalized anxiety disorder: Secondary | ICD-10-CM | POA: Insufficient documentation

## 2012-05-08 DIAGNOSIS — Z888 Allergy status to other drugs, medicaments and biological substances status: Secondary | ICD-10-CM | POA: Insufficient documentation

## 2012-05-08 DIAGNOSIS — S335XXA Sprain of ligaments of lumbar spine, initial encounter: Secondary | ICD-10-CM | POA: Insufficient documentation

## 2012-05-08 MED ORDER — OXYCODONE-ACETAMINOPHEN 5-325 MG PO TABS
1.0000 | ORAL_TABLET | Freq: Once | ORAL | Status: AC
  Administered 2012-05-08: 1 via ORAL
  Filled 2012-05-08: qty 1

## 2012-05-08 MED ORDER — OXYCODONE-ACETAMINOPHEN 5-325 MG PO TABS
2.0000 | ORAL_TABLET | Freq: Once | ORAL | Status: AC
  Administered 2012-05-08: 2 via ORAL
  Filled 2012-05-08: qty 2
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
## 2012-05-08 NOTE — ED Notes (Signed)
Pt states that even though tylenol is listed as one of her allergies, she can take Percoset without having an allergic reaction.
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
## 2012-06-07 ENCOUNTER — Emergency Department (HOSPITAL_COMMUNITY)
Admission: EM | Admit: 2012-06-07 | Discharge: 2012-06-07 | Disposition: A | Discharge status: Home / Self Care | Payer: Self-pay | Attending: Emergency Medicine | Admitting: Emergency Medicine

## 2012-06-07 ENCOUNTER — Encounter (HOSPITAL_COMMUNITY): Payer: Self-pay

## 2012-06-07 DIAGNOSIS — M545 Low back pain, unspecified: Secondary | ICD-10-CM | POA: Insufficient documentation

## 2012-06-07 DIAGNOSIS — J449 Chronic obstructive pulmonary disease, unspecified: Secondary | ICD-10-CM | POA: Insufficient documentation

## 2012-06-07 DIAGNOSIS — J4489 Other specified chronic obstructive pulmonary disease: Secondary | ICD-10-CM | POA: Insufficient documentation

## 2012-06-07 DIAGNOSIS — F17200 Nicotine dependence, unspecified, uncomplicated: Secondary | ICD-10-CM | POA: Insufficient documentation

## 2012-06-07 DIAGNOSIS — G8929 Other chronic pain: Secondary | ICD-10-CM | POA: Insufficient documentation

## 2012-06-07 DIAGNOSIS — F429 Obsessive-compulsive disorder, unspecified: Secondary | ICD-10-CM | POA: Insufficient documentation

## 2012-06-07 DIAGNOSIS — Z79899 Other long term (current) drug therapy: Secondary | ICD-10-CM | POA: Insufficient documentation

## 2012-06-07 MED ORDER — HYDROCODONE-ACETAMINOPHEN 5-325 MG PO TABS: 1.0000 | ORAL_TABLET | ORAL | Status: DC | PRN
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
## 2012-06-07 NOTE — ED Notes (Signed)
Pt complains of low back pain. Hx of disc problems.

## 2012-08-12 ENCOUNTER — Encounter (HOSPITAL_COMMUNITY): Payer: Self-pay | Admitting: Emergency Medicine

## 2012-08-12 ENCOUNTER — Emergency Department (HOSPITAL_COMMUNITY)
Admission: EM | Admit: 2012-08-12 | Discharge: 2012-08-12 | Disposition: A | Discharge status: Home / Self Care | Payer: Medicaid Other | Attending: Emergency Medicine | Admitting: Emergency Medicine

## 2012-08-12 DIAGNOSIS — F17200 Nicotine dependence, unspecified, uncomplicated: Secondary | ICD-10-CM | POA: Insufficient documentation

## 2012-08-12 DIAGNOSIS — R0789 Other chest pain: Secondary | ICD-10-CM | POA: Insufficient documentation

## 2012-08-12 DIAGNOSIS — H5710 Ocular pain, unspecified eye: Secondary | ICD-10-CM | POA: Insufficient documentation

## 2012-08-12 DIAGNOSIS — J4489 Other specified chronic obstructive pulmonary disease: Secondary | ICD-10-CM | POA: Insufficient documentation

## 2012-08-12 DIAGNOSIS — H53149 Visual discomfort, unspecified: Secondary | ICD-10-CM | POA: Insufficient documentation

## 2012-08-12 DIAGNOSIS — F319 Bipolar disorder, unspecified: Secondary | ICD-10-CM | POA: Insufficient documentation

## 2012-08-12 DIAGNOSIS — H109 Unspecified conjunctivitis: Secondary | ICD-10-CM

## 2012-08-12 DIAGNOSIS — F419 Anxiety disorder, unspecified: Secondary | ICD-10-CM

## 2012-08-12 DIAGNOSIS — F411 Generalized anxiety disorder: Secondary | ICD-10-CM | POA: Insufficient documentation

## 2012-08-12 DIAGNOSIS — J449 Chronic obstructive pulmonary disease, unspecified: Secondary | ICD-10-CM | POA: Insufficient documentation

## 2012-08-12 DIAGNOSIS — F429 Obsessive-compulsive disorder, unspecified: Secondary | ICD-10-CM | POA: Insufficient documentation

## 2012-08-12 DIAGNOSIS — Z87828 Personal history of other (healed) physical injury and trauma: Secondary | ICD-10-CM | POA: Insufficient documentation

## 2012-08-12 DIAGNOSIS — R002 Palpitations: Secondary | ICD-10-CM

## 2012-08-12 DIAGNOSIS — G43909 Migraine, unspecified, not intractable, without status migrainosus: Secondary | ICD-10-CM | POA: Insufficient documentation

## 2012-08-12 DIAGNOSIS — Z79899 Other long term (current) drug therapy: Secondary | ICD-10-CM | POA: Insufficient documentation

## 2012-08-12 DIAGNOSIS — R000 Tachycardia, unspecified: Secondary | ICD-10-CM

## 2012-08-12 DIAGNOSIS — I498 Other specified cardiac arrhythmias: Secondary | ICD-10-CM | POA: Insufficient documentation

## 2012-08-12 LAB — CBC WITH DIFFERENTIAL/PLATELET
Basophils Absolute: 0 10*3/uL (ref 0.0–0.1)
Basophils Relative: 1 % (ref 0–1)
Eosinophils Absolute: 0.1 10*3/uL (ref 0.0–0.7)
Eosinophils Relative: 2 % (ref 0–5)
HCT: 41.9 % (ref 36.0–46.0)
Hemoglobin: 14.2 g/dL (ref 12.0–15.0)
Lymphocytes Relative: 25 % (ref 12–46)
Lymphs Abs: 2.1 10*3/uL (ref 0.7–4.0)
MCH: 31 pg (ref 26.0–34.0)
MCHC: 33.9 g/dL (ref 30.0–36.0)
MCV: 91.5 fL (ref 78.0–100.0)
Monocytes Absolute: 0.3 10*3/uL (ref 0.1–1.0)
Monocytes Relative: 3 % (ref 3–12)
Neutro Abs: 5.8 10*3/uL (ref 1.7–7.7)
Neutrophils Relative %: 70 % (ref 43–77)
Platelets: 275 10*3/uL (ref 150–400)
RBC: 4.58 MIL/uL (ref 3.87–5.11)
RDW: 11.9 % (ref 11.5–15.5)
WBC: 8.4 10*3/uL (ref 4.0–10.5)

## 2012-08-12 LAB — BASIC METABOLIC PANEL
BUN: 7 mg/dL (ref 6–23)
CO2: 28 mEq/L (ref 19–32)
Calcium: 9.4 mg/dL (ref 8.4–10.5)
Chloride: 99 mEq/L (ref 96–112)
Creatinine, Ser: 0.66 mg/dL (ref 0.50–1.10)
GFR calc Af Amer: >90 mL/min (ref >90)
GFR calc non Af Amer: >90 mL/min (ref >90)
Glucose, Bld: 88 mg/dL (ref 70–99)
Potassium: 4 mEq/L (ref 3.5–5.1)
Sodium: 138 mEq/L (ref 135–145)

## 2012-08-12 LAB — POCT I-STAT TROPONIN I: Troponin i, poc: 0 ng/mL (ref 0.00–0.08)

## 2012-08-12 MED ORDER — ALPRAZOLAM 1 MG PO TABS: 1.0000 mg | ORAL_TABLET | Freq: Four times a day (QID) | ORAL | Status: DC

## 2012-08-12 MED ORDER — LORAZEPAM 1 MG PO TABS
2.0000 mg | ORAL_TABLET | Freq: Once | ORAL | Status: AC
  Administered 2012-08-12: 2 mg via ORAL
  Filled 2012-08-12: qty 4

## 2012-08-12 MED ORDER — TETRACAINE HCL 0.5 % OP SOLN
2.0000 [drp] | Freq: Once | OPHTHALMIC | Status: AC
  Administered 2012-08-12: 2 [drp] via OPHTHALMIC
  Filled 2012-08-12: qty 2

## 2012-08-12 MED ORDER — TOBRAMYCIN 0.3 % OP SOLN
2.0000 [drp] | OPHTHALMIC | Status: DC
  Administered 2012-08-12 (×2): 2 [drp] via OPHTHALMIC
  Filled 2012-08-12: qty 5

## 2012-08-12 MED ORDER — FLUORESCEIN-BENOXINATE 0.25-0.4 % OP SOLN
1.0000 [drp] | Freq: Once | OPHTHALMIC | Status: DC
  Filled 2012-08-12: qty 5

## 2012-08-12 MED ORDER — LORAZEPAM 2 MG/ML IJ SOLN
2.0000 mg | Freq: Once | INTRAMUSCULAR | Status: AC
  Administered 2012-08-12: 2 mg via INTRAMUSCULAR
  Filled 2012-08-12: qty 1

## 2012-08-12 MED ORDER — SODIUM CHLORIDE 0.9 % IV BOLUS (SEPSIS)
1000.0000 mL | Freq: Once | INTRAVENOUS | Status: AC
  Administered 2012-08-12: 1000 mL via INTRAVENOUS

## 2012-08-12 MED ORDER — FLUORESCEIN SODIUM 1 MG OP STRP
1.0000 | ORAL_STRIP | Freq: Once | OPHTHALMIC | Status: AC
  Administered 2012-08-12: 1 via OPHTHALMIC
  Filled 2012-08-12: qty 1

## 2012-08-12 NOTE — ED Notes (Addendum)
Pt sts eye pain and burning with discharge x 3 days; pt also sts out of anxiety and depression meds x 3 weeks and is anxious at present
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
## 2012-08-12 NOTE — ED Provider Notes (Signed)
4:00 PM Assumed care of patient in the CDU from Dr. Oletta Lamas.  Patient presented today with two separate complaints.  First complaint was redness and crusting of her right eye.  Second complaint was heart palpitations.  Initially HR was 96.  Heart rate than rose to 119.  Patient given Ativan PO and IM.  She has also been ordered a bolus of 1 L IVF.  BMP also checked.  Plan is for the patient to be discharged home once tachycardia resolves if her labs are WNL.  Patient will be discharged home with Tobramycin eye drops to treat for Acute Conjunctivitis.  Patient will also be discharged home with prescription for Xanax 1 mg.    5:04 PM Reassessed patient.  Her HR is currently 93 BPM.  Labs unremarkable.  Troponin negative.  Feel that patient can be discharged home.  Pascal Lux [place], PA-C 08/12/12 2207

## 2012-08-12 NOTE — Discharge Instructions (Signed)
Anxiety and Panic Attacks Your caregiver has informed you that you are having an anxiety or panic attack. There may be many forms of this. Most of the time these attacks come suddenly and without warning. They come at any time of day, including periods of sleep, and at any time of life. They may be strong and unexplained. Although panic attacks are very scary, they are physically harmless. Sometimes the cause of your anxiety is not known. Anxiety is a protective mechanism of the body in its fight or flight mechanism. Most of these perceived danger situations are actually nonphysical situations (such as anxiety over losing a job). CAUSES  The causes of an anxiety or panic attack are many. Panic attacks may occur in otherwise healthy people given a certain set of circumstances. There may be a genetic cause for panic attacks. Some medications may also have anxiety as a side effect. SYMPTOMS  Some of the most common feelings are:  Intense terror.  Dizziness, feeling faint.  Hot and cold flashes.  Fear of going crazy.  Feelings that nothing is real.  Sweating.  Shaking.  Chest pain or a fast heartbeat (palpitations).  Smothering, choking sensations.  Feelings of impending doom and that death is near.  Tingling of extremities, this may be from over-breathing.  Altered reality (derealization).  Being detached from yourself (depersonalization). Several symptoms can be present to make up anxiety or panic attacks. DIAGNOSIS  The evaluation by your caregiver will depend on the type of symptoms you are experiencing. The diagnosis of anxiety or panic attack is made when no physical illness can be determined to be a cause of the symptoms. TREATMENT  Treatment to prevent anxiety and panic attacks may include:  Avoidance of circumstances that cause anxiety.  Reassurance and relaxation.  Regular exercise.  Relaxation therapies, such as yoga.  Psychotherapy with a psychiatrist or  therapist.  Avoidance of caffeine, alcohol and illegal drugs.  Prescribed medication. SEEK IMMEDIATE MEDICAL CARE IF:   You experience panic attack symptoms that are different than your usual symptoms.  You have any worsening or concerning symptoms. Document Released: 09/04/2005 Document Revised: 11/27/2011 Document Reviewed: 01/06/2010 [facility] Patient Information 2013 [place], [place].  RESOURCE GUIDE  Chronic Pain Problems: Contact [facility]  [phone] Patients need to be referred by their primary care doctor.  Insufficient Money for Medicine: Contact United Way:  call "211."   No Primary Care Doctor: - Call Health Connect  [phone] - can help you locate a primary care doctor that  accepts your insurance, provides certain services, etc. - Physician Referral Service- [phone]  Agencies that provide inexpensive medical care: - [facility]  [phone] - [facility]  [phone] - [facility]  [phone] - Women's Clinic  [phone] - Planned Parenthood  [phone] [facility]  [phone]  Medicaid-accepting [facility] Providers: - [facility]- [address]  [phone], Mon-Fri 9am-7pm, Sat 9am-1pm - [facility]- [address] [place], Suite [place]  [phone] - [facility]- [address], Suite [place]  [phone] [facility]- [address]  [phone] - Renaye Rakers- [address] [place], [address], [phone]  Only accepts [place] patients after they have their name  applied to their card  Self Pay (no insurance) in [place]: - Sickle Cell Patients: Dr Willey Blade, [facility]  [address] [place], [phone] -  [facility]- [address] [place]  [phone]       [facility] [place]- [address]       -     [facility]-  see information above (Speak to [employer] if you do not have insurance)       -  [facility]- [address],  [phone]       -  [facility]- [address] [phone]       -  Dr Julio Sicks-  [address], [place], [phone]       -  [facility] - [address] [phone]       -  [facility]- [address] [phone], also [address] [phone]       -    [facility]- [address] [place], [phone], 1st & 3rd Saturday        every month, 10am-1pm  1) Find a Doctor and Pay Out of Pocket Although you won't have to find out who is covered by your insurance plan, it is a good idea to ask around and get recommendations. You will then need to call the office and see if the doctor you have chosen will accept you as a new patient and what types of options they offer for patients who are self-pay. Some doctors offer discounts or will set up payment plans for their patients who do not have insurance, but you will need to ask so you aren't surprised when you get to your appointment.  2) Contact Your Local Health Department Not all health departments have doctors that can see patients for sick visits, but many do, so it is worth a call to see if yours does. If you don't know where your local health department is, you can check in your phone book. The CDC also has a tool to help you locate your state's health department, and many state websites also have listings of all of their local health departments.  3) Find a Walk-in Clinic If your illness is not likely to be very severe or complicated, you may want to try a walk in clinic. These are popping up all over the country in pharmacies, drugstores, and shopping centers. They're usually staffed by nurse practitioners or physician assistants that have been trained to treat common illnesses and complaints. They're usually fairly quick and inexpensive. However,  if you have serious medical issues or chronic medical problems, these are probably not your best option  STD Testing - [facility] Department of [facility], STD Clinic, [address] [place], phone [phone] or [phone].  Monday - Friday, call for an appointment. [facility] Department of [employer], STD Clinic, [address], [place], phone [phone] or [phone].  Monday - Friday, call for an appointment.  Abuse/Neglect: [facility] Child Abuse Hotline (267)719-6365 [facility] Child Abuse Hotline 828-612-8148 (After Hours)  Emergency Shelter:  [facility] [phone]  Maternity Homes: - Room at the [place] [facility] 9402405843 - [facility] (934)[phone]  MRSA Hotline #:   (226) 111-0789  [facility] Resources [facility]  United Way [facility]. [address]  [address]         [address] [place] [address]  Blondell Reveal Phone:  [phone]                                  Phone:  [phone]                   Phone:  [phone]  [facility], [phone] - [facility]- [phone]       -     [facility] in [place], [address]             [phone], Insurance  [place] Child Abuse Hotline 773-349-5215 or 905-824-6368 (After Hours)  Dental Assistance  If unable to pay or uninsured, contact:  [facility]. to become qualified for the adult dental clinic.  Patients with Medicaid: [facility] [phone] [address], [phone] [address] [phone]  If unable to pay, or uninsured, contact [facility] [phone] in [place], [phone] in [facility]) to  become qualified for the adult dental clinic  Other Low-Cost Community Dental Services: - [facility]- [address] [place], [place], [place], [address], [phone], Ext. [phone], 2nd and 4th Thursday of the month at 6:30am.  10 clients each day by appointment, can sometimes see walk-in patients if someone does not show for an appointment. [facility]- [address] [place] [place], [place], [address], [phone] - [facility] [address] [place], [place], [address], [phone] - [facility]- [phone] [facility] Health Department- [phone] [facility] Health [phone]       Behavioral Health Resources in the [facility]  Intensive Outpatient Programs: [facility]      [address] [place], [place] [phone] Both a day and evening program       [facility] Outpatient     [address]        [place], [place] [address] [phone]         ADS: [facility] [address] [place] [phone]  [facility] Mental Health ACCESS LINE: 605-401-7501 or [phone] [address] [place], [place] [address] [email or web address]  Behavioral Health Services  Substance Abuse Resources: - [facility] Drug [facility]  [phone] - [facility] [phone] - [facility] [phone] Floydene Flock [phone] - Residential & Outpatient Substance Abuse Program  [phone]  Psychological Services: [facility]  [phone] [facility]  [phone] - [facility], [phone] [place]. [address] [place], ACCESS LINE: (918)175-7069 or (305)[phone], [email or web address]  Mobile Crisis Teams:  Therapeutic Alternatives         Mobile Crisis Care Unit [phone]             Assertive Psychotherapeutic  Services [address] Ginette Otto [phone]                                         Interventionist [address] [place] [phone]  Self-Help/Support Groups: Mental Health Assoc. of [employer] of support groups 417-873-8037 (call for more info)   Narcotics Anonymous (NA) Caring Services [address] [place] - 2 meetings at this location  Residential Treatment Programs:  [facility]      [address]        [place]       [phone]         [facility] [address] [place] [address] [place], [place]  [address] [phone]  [facility] Treatment Facility  [address] [place], [place] [address] [phone] Admissions: 8am-3pm M-F  [facility]     [address]        [place], [place] [address]       [phone]         [facility] [address] [place], [place] [phone]  [facility] [address] [place], [place] [phone]  [facility] - Intensive Outpatient      [address]     [place], [place]       [phone]         [facility] ([facility]     [address] [place], [place] [phone] or [phone]  Residential Treatment Services (RTS), Medicaid [address] [place], [place] [phone]  [facility] [address]                                               [address] [place] [phone]  [facility] Community Health Services Resources: [facility] [place] Services347 [phone]               General Therapy                                                Angie Fava, PhD        [address] [place], [place] [address]         [phone]   Insurance  [facility]al   [address] [place], [place] [address] [phone]  [facility] [address] [place], [place]  [address] [phone] Insurance/Medicaid/sponsorship through [facility]  Faith and Families                                              [address] [phone]  [place], [place] [address]    Therapy/tele-psych/case         [phone]          [facility] [address], [place]  [address]  Adolescent/group home/case management [phone]                                           Creola Corn PhD       General therapy       Insurance   [phone]         Dr. Lolly Mustache, Insurance, M-F [phone]

## 2012-08-12 NOTE — ED Notes (Signed)
PT is requesting refills on anxiety and depression meds.

## 2012-08-13 NOTE — ED Provider Notes (Signed)
Medical screening examination/treatment/procedure(s) were conducted as a shared visit with non-physician practitioner(s) and myself.  I personally evaluated the patient during the encounter   Gavin Pound. Keerstin Bjelland, MD 08/13/12 1610

## 2012-12-07 ENCOUNTER — Encounter (HOSPITAL_COMMUNITY): Payer: Self-pay | Admitting: Nurse Practitioner

## 2012-12-07 ENCOUNTER — Emergency Department (HOSPITAL_COMMUNITY)
Admission: EM | Admit: 2012-12-07 | Discharge: 2012-12-07 | Disposition: A | Discharge status: Home / Self Care | Payer: Medicaid Other | Attending: Emergency Medicine | Admitting: Emergency Medicine

## 2012-12-07 DIAGNOSIS — M545 Low back pain, unspecified: Secondary | ICD-10-CM | POA: Insufficient documentation

## 2012-12-07 DIAGNOSIS — R002 Palpitations: Secondary | ICD-10-CM | POA: Insufficient documentation

## 2012-12-07 DIAGNOSIS — Z87828 Personal history of other (healed) physical injury and trauma: Secondary | ICD-10-CM | POA: Insufficient documentation

## 2012-12-07 DIAGNOSIS — F429 Obsessive-compulsive disorder, unspecified: Secondary | ICD-10-CM | POA: Insufficient documentation

## 2012-12-07 DIAGNOSIS — F419 Anxiety disorder, unspecified: Secondary | ICD-10-CM

## 2012-12-07 DIAGNOSIS — F17200 Nicotine dependence, unspecified, uncomplicated: Secondary | ICD-10-CM | POA: Insufficient documentation

## 2012-12-07 DIAGNOSIS — Z79899 Other long term (current) drug therapy: Secondary | ICD-10-CM | POA: Insufficient documentation

## 2012-12-07 DIAGNOSIS — R0789 Other chest pain: Secondary | ICD-10-CM | POA: Insufficient documentation

## 2012-12-07 DIAGNOSIS — F319 Bipolar disorder, unspecified: Secondary | ICD-10-CM | POA: Insufficient documentation

## 2012-12-07 DIAGNOSIS — G8929 Other chronic pain: Secondary | ICD-10-CM | POA: Insufficient documentation

## 2012-12-07 DIAGNOSIS — G43909 Migraine, unspecified, not intractable, without status migrainosus: Secondary | ICD-10-CM | POA: Insufficient documentation

## 2012-12-07 DIAGNOSIS — F411 Generalized anxiety disorder: Secondary | ICD-10-CM | POA: Insufficient documentation

## 2012-12-07 MED ORDER — HYDROMORPHONE HCL PF 2 MG/ML IJ SOLN
2.0000 mg | Freq: Once | INTRAMUSCULAR | Status: AC
  Administered 2012-12-07: 2 mg via INTRAMUSCULAR
  Filled 2012-12-07: qty 1

## 2012-12-07 MED ORDER — DIAZEPAM 5 MG PO TABS
5.0000 mg | ORAL_TABLET | Freq: Once | ORAL | Status: AC
  Administered 2012-12-07: 5 mg via ORAL
  Filled 2012-12-07: qty 1
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
## 2012-12-07 NOTE — ED Notes (Signed)
Pt with anxiety since last night. Ran out of Xanax approx 1 week ago. Chronic low back pain.

## 2012-12-07 NOTE — ED Notes (Signed)
Pt given a Coke.  

## 2012-12-07 NOTE — ED Notes (Signed)
States she is having anxiety and ran out of her xanax, normally if she takes one it will go away. Also c/o back pain, reports history of chronic back pain.

## 2012-12-11 NOTE — ED Provider Notes (Signed)
Medical screening examination/treatment/procedure(s) were performed by non-physician practitioner and as supervising physician I was immediately available for consultation/collaboration.   David H Yao, MD 12/11/12 1547 

## 2012-12-28 ENCOUNTER — Emergency Department (HOSPITAL_COMMUNITY)
Admission: EM | Admit: 2012-12-28 | Discharge: 2012-12-28 | Disposition: A | Discharge status: Home / Self Care | Payer: Medicaid Other | Attending: Emergency Medicine | Admitting: Emergency Medicine

## 2012-12-28 ENCOUNTER — Encounter (HOSPITAL_COMMUNITY): Payer: Self-pay | Admitting: Emergency Medicine

## 2012-12-28 DIAGNOSIS — Z87828 Personal history of other (healed) physical injury and trauma: Secondary | ICD-10-CM | POA: Insufficient documentation

## 2012-12-28 DIAGNOSIS — J4489 Other specified chronic obstructive pulmonary disease: Secondary | ICD-10-CM | POA: Insufficient documentation

## 2012-12-28 DIAGNOSIS — J449 Chronic obstructive pulmonary disease, unspecified: Secondary | ICD-10-CM | POA: Insufficient documentation

## 2012-12-28 DIAGNOSIS — F429 Obsessive-compulsive disorder, unspecified: Secondary | ICD-10-CM | POA: Insufficient documentation

## 2012-12-28 DIAGNOSIS — F411 Generalized anxiety disorder: Secondary | ICD-10-CM | POA: Insufficient documentation

## 2012-12-28 DIAGNOSIS — F419 Anxiety disorder, unspecified: Secondary | ICD-10-CM

## 2012-12-28 DIAGNOSIS — F17200 Nicotine dependence, unspecified, uncomplicated: Secondary | ICD-10-CM | POA: Insufficient documentation

## 2012-12-28 DIAGNOSIS — G43909 Migraine, unspecified, not intractable, without status migrainosus: Secondary | ICD-10-CM | POA: Insufficient documentation

## 2012-12-28 DIAGNOSIS — F319 Bipolar disorder, unspecified: Secondary | ICD-10-CM | POA: Insufficient documentation

## 2012-12-28 DIAGNOSIS — IMO0002 Reserved for concepts with insufficient information to code with codable children: Secondary | ICD-10-CM | POA: Insufficient documentation

## 2012-12-28 DIAGNOSIS — G8929 Other chronic pain: Secondary | ICD-10-CM | POA: Insufficient documentation

## 2012-12-28 DIAGNOSIS — M549 Dorsalgia, unspecified: Secondary | ICD-10-CM | POA: Insufficient documentation

## 2012-12-28 DIAGNOSIS — Z79899 Other long term (current) drug therapy: Secondary | ICD-10-CM | POA: Insufficient documentation

## 2012-12-28 MED ORDER — PREDNISONE 20 MG PO TABS: 40.0000 mg | ORAL_TABLET | Freq: Every day | ORAL | Status: DC

## 2012-12-28 MED ORDER — IBUPROFEN 800 MG PO TABS: 800.0000 mg | ORAL_TABLET | Freq: Three times a day (TID) | ORAL | Status: DC

## 2012-12-28 MED ORDER — ALPRAZOLAM 1 MG PO TABS: 2.0000 mg | ORAL_TABLET | Freq: Three times a day (TID) | ORAL | Status: DC | PRN

## 2012-12-28 MED ORDER — DIAZEPAM 5 MG PO TABS
5.0000 mg | ORAL_TABLET | Freq: Once | ORAL | Status: AC
  Administered 2012-12-28: 5 mg via ORAL
  Filled 2012-12-28: qty 1

## 2012-12-28 MED ORDER — HYDROMORPHONE HCL PF 1 MG/ML IJ SOLN
0.5000 mg | Freq: Once | INTRAMUSCULAR | Status: AC
  Administered 2012-12-28: 0.5 mg via INTRAMUSCULAR
  Filled 2012-12-28: qty 1

## 2012-12-28 NOTE — ED Notes (Signed)
Pt. Stated, i started 2 days ago with anxiety and my back hurts but its an everyday thing
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
## 2013-01-07 NOTE — ED Provider Notes (Signed)
Medical screening examination/treatment/procedure(s) were performed by non-physician practitioner and as supervising physician I was immediately available for consultation/collaboration.    Celene Kras, MD 01/07/13 [phone]

## 2013-01-07 NOTE — ED Provider Notes (Signed)
Medical screening examination/treatment/procedure(s) were performed by non-physician practitioner and as supervising physician I was immediately available for consultation/collaboration.   Celene Kras, MD 01/07/13 [phone]

## 2013-01-27 ENCOUNTER — Emergency Department (HOSPITAL_COMMUNITY): Payer: Medicaid Other

## 2013-01-27 ENCOUNTER — Encounter (HOSPITAL_COMMUNITY): Payer: Self-pay | Admitting: Emergency Medicine

## 2013-01-27 ENCOUNTER — Emergency Department (HOSPITAL_COMMUNITY)
Admission: EM | Admit: 2013-01-27 | Discharge: 2013-01-27 | Disposition: A | Discharge status: Home / Self Care | Payer: Medicaid Other | Attending: Emergency Medicine | Admitting: Emergency Medicine

## 2013-01-27 DIAGNOSIS — F411 Generalized anxiety disorder: Secondary | ICD-10-CM | POA: Insufficient documentation

## 2013-01-27 DIAGNOSIS — W010XXA Fall on same level from slipping, tripping and stumbling without subsequent striking against object, initial encounter: Secondary | ICD-10-CM | POA: Insufficient documentation

## 2013-01-27 DIAGNOSIS — F17200 Nicotine dependence, unspecified, uncomplicated: Secondary | ICD-10-CM | POA: Insufficient documentation

## 2013-01-27 DIAGNOSIS — Z8739 Personal history of other diseases of the musculoskeletal system and connective tissue: Secondary | ICD-10-CM | POA: Insufficient documentation

## 2013-01-27 DIAGNOSIS — Z8679 Personal history of other diseases of the circulatory system: Secondary | ICD-10-CM | POA: Insufficient documentation

## 2013-01-27 DIAGNOSIS — Y9301 Activity, walking, marching and hiking: Secondary | ICD-10-CM | POA: Insufficient documentation

## 2013-01-27 DIAGNOSIS — F429 Obsessive-compulsive disorder, unspecified: Secondary | ICD-10-CM | POA: Insufficient documentation

## 2013-01-27 DIAGNOSIS — F319 Bipolar disorder, unspecified: Secondary | ICD-10-CM | POA: Insufficient documentation

## 2013-01-27 DIAGNOSIS — J449 Chronic obstructive pulmonary disease, unspecified: Secondary | ICD-10-CM | POA: Insufficient documentation

## 2013-01-27 DIAGNOSIS — S8392XA Sprain of unspecified site of left knee, initial encounter: Secondary | ICD-10-CM

## 2013-01-27 DIAGNOSIS — J4489 Other specified chronic obstructive pulmonary disease: Secondary | ICD-10-CM | POA: Insufficient documentation

## 2013-01-27 DIAGNOSIS — IMO0002 Reserved for concepts with insufficient information to code with codable children: Secondary | ICD-10-CM | POA: Insufficient documentation

## 2013-01-27 DIAGNOSIS — Z79899 Other long term (current) drug therapy: Secondary | ICD-10-CM | POA: Insufficient documentation

## 2013-01-27 DIAGNOSIS — Y929 Unspecified place or not applicable: Secondary | ICD-10-CM | POA: Insufficient documentation

## 2013-01-27 DIAGNOSIS — X500XXA Overexertion from strenuous movement or load, initial encounter: Secondary | ICD-10-CM | POA: Insufficient documentation

## 2013-01-27 MED ORDER — HYDROMORPHONE HCL PF 1 MG/ML IJ SOLN
1.0000 mg | Freq: Once | INTRAMUSCULAR | Status: AC
  Administered 2013-01-27: 1 mg via INTRAMUSCULAR
  Filled 2013-01-27: qty 1
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
## 2013-01-27 NOTE — Progress Notes (Signed)
Orthopedic Tech Progress Note Patient Details:  Nancy Bowers 1976-06-15 [id number]  Ortho Devices Type of Ortho Device: Knee Immobilizer Ortho Device/Splint Location: LLE Ortho Device/Splint Interventions: Ordered;Application   Jennye Moccasin 01/27/2013, 6:01 PM

## 2013-01-27 NOTE — ED Notes (Signed)
Pt sts fell yesterday and twisted left knee; pt sts continued pain

## 2013-02-19 ENCOUNTER — Emergency Department (HOSPITAL_COMMUNITY)
Admission: EM | Admit: 2013-02-19 | Discharge: 2013-02-19 | Disposition: A | Discharge status: Home / Self Care | Payer: Medicaid Other | Attending: Emergency Medicine | Admitting: Emergency Medicine

## 2013-02-19 ENCOUNTER — Encounter (HOSPITAL_COMMUNITY): Payer: Self-pay | Admitting: Emergency Medicine

## 2013-02-19 DIAGNOSIS — Z79899 Other long term (current) drug therapy: Secondary | ICD-10-CM | POA: Insufficient documentation

## 2013-02-19 DIAGNOSIS — F17200 Nicotine dependence, unspecified, uncomplicated: Secondary | ICD-10-CM | POA: Insufficient documentation

## 2013-02-19 DIAGNOSIS — J4489 Other specified chronic obstructive pulmonary disease: Secondary | ICD-10-CM | POA: Insufficient documentation

## 2013-02-19 DIAGNOSIS — F319 Bipolar disorder, unspecified: Secondary | ICD-10-CM | POA: Insufficient documentation

## 2013-02-19 DIAGNOSIS — M545 Low back pain, unspecified: Secondary | ICD-10-CM | POA: Insufficient documentation

## 2013-02-19 DIAGNOSIS — J449 Chronic obstructive pulmonary disease, unspecified: Secondary | ICD-10-CM | POA: Insufficient documentation

## 2013-02-19 DIAGNOSIS — F411 Generalized anxiety disorder: Secondary | ICD-10-CM | POA: Insufficient documentation

## 2013-02-19 DIAGNOSIS — F429 Obsessive-compulsive disorder, unspecified: Secondary | ICD-10-CM | POA: Insufficient documentation

## 2013-02-19 DIAGNOSIS — Z888 Allergy status to other drugs, medicaments and biological substances status: Secondary | ICD-10-CM | POA: Insufficient documentation

## 2013-02-19 DIAGNOSIS — Z8679 Personal history of other diseases of the circulatory system: Secondary | ICD-10-CM | POA: Insufficient documentation

## 2013-02-19 DIAGNOSIS — G8929 Other chronic pain: Secondary | ICD-10-CM | POA: Insufficient documentation

## 2013-02-19 MED ORDER — OXYCODONE HCL 5 MG PO TABS
5.0000 mg | ORAL_TABLET | ORAL | Status: AC
  Administered 2013-02-19: 5 mg via ORAL
  Filled 2013-02-19: qty 1

## 2013-02-19 MED ORDER — ALPRAZOLAM 0.25 MG PO TABS
0.5000 mg | ORAL_TABLET | Freq: Once | ORAL | Status: AC
  Administered 2013-02-19: 0.5 mg via ORAL
  Filled 2013-02-19: qty 2

## 2013-02-19 MED ORDER — HYDROMORPHONE HCL PF 2 MG/ML IJ SOLN
2.0000 mg | Freq: Once | INTRAMUSCULAR | Status: DC
  Filled 2013-02-19: qty 1

## 2013-02-19 MED ORDER — ONDANSETRON 4 MG PO TBDP
8.0000 mg | ORAL_TABLET | Freq: Once | ORAL | Status: AC
  Administered 2013-02-19: 8 mg via ORAL
  Filled 2013-02-19: qty 2

## 2013-02-19 NOTE — ED Notes (Signed)
Out of pain medication (Percocet 10/325mg ) x 2 days for chronic lower back pain.  States she has appt at pain management on 6/14.  Also reports out of xanax for anxiety.

## 2013-02-19 NOTE — ED Notes (Signed)
Pt. Also states "my anxiety is flaring up". Pt. Out of xanax x2 days. States normally sees Dr. Tiburcio Pea but he has since let her go so she doesn't have anyone to fill the prescription for her.
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
## 2013-02-21 NOTE — ED Provider Notes (Signed)
Medical screening examination/treatment/procedure(s) were performed by non-physician practitioner and as supervising physician I was immediately available for consultation/collaboration.   Laray Anger, DO 02/21/13 [phone]

## 2013-04-02 ENCOUNTER — Encounter (HOSPITAL_COMMUNITY): Payer: Self-pay | Admitting: Nurse Practitioner

## 2013-04-02 ENCOUNTER — Emergency Department (HOSPITAL_COMMUNITY)
Admission: EM | Admit: 2013-04-02 | Discharge: 2013-04-02 | Disposition: A | Discharge status: Home / Self Care | Payer: Medicaid Other | Attending: Emergency Medicine | Admitting: Emergency Medicine

## 2013-04-02 DIAGNOSIS — M549 Dorsalgia, unspecified: Secondary | ICD-10-CM | POA: Insufficient documentation

## 2013-04-02 DIAGNOSIS — Z79899 Other long term (current) drug therapy: Secondary | ICD-10-CM | POA: Insufficient documentation

## 2013-04-02 DIAGNOSIS — J4489 Other specified chronic obstructive pulmonary disease: Secondary | ICD-10-CM | POA: Insufficient documentation

## 2013-04-02 DIAGNOSIS — J449 Chronic obstructive pulmonary disease, unspecified: Secondary | ICD-10-CM | POA: Insufficient documentation

## 2013-04-02 DIAGNOSIS — F419 Anxiety disorder, unspecified: Secondary | ICD-10-CM

## 2013-04-02 DIAGNOSIS — F319 Bipolar disorder, unspecified: Secondary | ICD-10-CM | POA: Insufficient documentation

## 2013-04-02 DIAGNOSIS — F429 Obsessive-compulsive disorder, unspecified: Secondary | ICD-10-CM | POA: Insufficient documentation

## 2013-04-02 DIAGNOSIS — Z87828 Personal history of other (healed) physical injury and trauma: Secondary | ICD-10-CM | POA: Insufficient documentation

## 2013-04-02 DIAGNOSIS — F411 Generalized anxiety disorder: Secondary | ICD-10-CM | POA: Insufficient documentation

## 2013-04-02 DIAGNOSIS — R450 Nervousness: Secondary | ICD-10-CM | POA: Insufficient documentation

## 2013-04-02 DIAGNOSIS — F17200 Nicotine dependence, unspecified, uncomplicated: Secondary | ICD-10-CM | POA: Insufficient documentation

## 2013-04-02 DIAGNOSIS — G8929 Other chronic pain: Secondary | ICD-10-CM | POA: Insufficient documentation

## 2013-04-02 DIAGNOSIS — G43909 Migraine, unspecified, not intractable, without status migrainosus: Secondary | ICD-10-CM | POA: Insufficient documentation

## 2013-04-02 HISTORY — DX: Dorsalgia, unspecified: M54.9

## 2013-04-02 MED ORDER — OXYCODONE-ACETAMINOPHEN 5-325 MG PO TABS
2.0000 | ORAL_TABLET | Freq: Once | ORAL | Status: AC
  Administered 2013-04-02: 2 via ORAL
  Filled 2013-04-02: qty 2

## 2013-04-02 MED ORDER — DIAZEPAM 5 MG PO TABS
5.0000 mg | ORAL_TABLET | Freq: Once | ORAL | Status: AC
  Administered 2013-04-02: 5 mg via ORAL
  Filled 2013-04-02: qty 1
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
## 2013-04-02 NOTE — ED Notes (Signed)
Pt with chronic back pain, worse today, no meds at home for pain. Also states her anxiety is high today, no meds at home for that either, and she has felt very anxious today.

## 2013-04-02 NOTE — Discharge Instructions (Signed)
1. Keep your pain management appointment tomorrow and discuss your need for pain medicine.  2. Chronic Pain Discharge Instructions  Emergency care providers appreciate that many patients coming to [place] are in severe pain and we wish to address their pain in the safest, most responsible manner.  It is important to recognize however, that the proper treatment of chronic pain differs from that of the pain of injuries and acute illnesses.  Our goal is to provide quality, safe, personalized care and we thank you for giving [place] the opportunity to serve you. The use of narcotics and related agents for chronic pain syndromes may lead to additional physical and psychological problems.  Nearly as many people die from prescription narcotics each year as die from car crashes.  Additionally, this risk is increased if such prescriptions are obtained from a variety of sources.  Therefore, only your primary care physician or a pain management specialist is able to safely treat such syndromes with narcotic medications long-term.    Documentation revealing such prescriptions have been sought from multiple sources may prohibit [place] from providing a refill or different narcotic medication.  Your name may be checked first through the [facility] Controlled Substances Reporting System.  This database is a record of controlled substance medication prescriptions that the patient has received.  This has been established by [facility] in an effort to eliminate the dangerous, and often life threatening, practice of obtaining multiple prescriptions from different medical providers.   If you have a chronic pain syndrome (i.e. chronic headaches, recurrent back or neck pain, dental pain, abdominal or pelvis pain without a specific diagnosis, or neuropathic pain such as fibromyalgia) or recurrent visits for the same condition without an acute diagnosis, you may be treated with non-narcotics and other non-addictive medicines.  Allergic  reactions or negative side effects that may be reported by a patient to such medications will not typically lead to the use of a narcotic analgesic or other controlled substance as an alternative.   Patients managing chronic pain with a personal physician should have provisions in place for breakthrough pain.  If you are in crisis, you should call your physician.  If your physician directs you to the emergency department, please have the doctor call and speak to our attending physician concerning your care.   When patients come to the Emergency Department (ED) with acute medical conditions in which the Emergency Department physician feels appropriate to prescribe narcotic or sedating pain medication, the physician will prescribe these in very limited quantities.  The amount of these medications will last only until you can see your primary care physician in his/her office.  Any patient who returns to the ED seeking refills should expect only non-narcotic pain medications.   In the event of an acute medical condition exists and the emergency physician feels it is necessary that the patient be given a narcotic or sedating medication -  a responsible adult driver should be present in the room prior to the medication being given by the nurse.   Prescriptions for narcotic or sedating medications that have been lost, stolen or expired will not be refilled in the Emergency Department.    Patients who have chronic pain may receive non-narcotic prescriptions until seen by their primary care physician.  It is every patients personal responsibility to maintain active prescriptions with his or her primary care physician or specialist.

## 2013-04-02 NOTE — ED Notes (Addendum)
Pt states she "normally takes Percocet". Goes to pain management, has appointment 8/8.

## 2013-04-03 NOTE — ED Provider Notes (Signed)
Medical screening examination/treatment/procedure(s) were performed by non-physician practitioner and as supervising physician I was immediately available for consultation/collaboration.   [name], MD 04/03/13 [phone]

## 2013-04-23 ENCOUNTER — Encounter (HOSPITAL_COMMUNITY): Payer: Self-pay

## 2013-04-23 ENCOUNTER — Emergency Department (HOSPITAL_COMMUNITY)
Admission: EM | Admit: 2013-04-23 | Discharge: 2013-04-23 | Disposition: A | Discharge status: Home / Self Care | Payer: Medicaid Other | Source: Home / Self Care

## 2013-04-23 DIAGNOSIS — R51 Headache: Secondary | ICD-10-CM

## 2013-04-23 DIAGNOSIS — R519 Headache, unspecified: Secondary | ICD-10-CM

## 2013-04-23 DIAGNOSIS — L0390 Cellulitis, unspecified: Secondary | ICD-10-CM

## 2013-04-23 DIAGNOSIS — L0291 Cutaneous abscess, unspecified: Secondary | ICD-10-CM

## 2013-04-23 MED ORDER — DOXYCYCLINE HYCLATE 100 MG PO CAPS: 100.0000 mg | ORAL_CAPSULE | Freq: Two times a day (BID) | ORAL | Status: DC

## 2013-04-23 NOTE — ED Notes (Signed)
C/o she woke w HA on right side of her face, orbital swelling; denies injury

## 2013-04-23 NOTE — ED Provider Notes (Signed)
Dylynn Ketner is a 37 y.o. female who presents to Urgent Care today for headache and right eye swelling. Patient developed puffiness of the tissue surrounding the right eye this morning. Additionally she notes a mild headache. She denies any blurry vision or pain with eye motion. She has not tried any medications yet. Additionally she notes a stuffy nose. She denies any fevers or chills and feels well otherwise. No nausea vomiting or diarrhea or trouble breathing.    PMH reviewed. Chronic back pain History  Substance Use Topics  . Smoking status: Current Every Day Smoker -- 1.00 packs/day    Types: Cigarettes  . Smokeless tobacco: Not on file  . Alcohol Use: No     Comment: occ   ROS as above Medications reviewed. No current facility-administered medications for this encounter.   Current Outpatient Prescriptions  Medication Sig Dispense Refill  . alprazolam (XANAX) 2 MG tablet Take 2 mg by mouth at bedtime as needed for sleep.      . fentaNYL (DURAGESIC - DOSED MCG/HR) 12 MCG/HR Place 1 patch onto the skin every 3 (three) days.      [name] doxycycline (VIBRAMYCIN) 100 MG capsule Take 1 capsule (100 mg total) by mouth 2 (two) times daily.  20 capsule  0  . gabapentin (NEURONTIN) 600 MG tablet Take 600 mg by mouth 3 (three) times daily.      [name] ibuprofen (ADVIL,MOTRIN) 800 MG tablet Take 800 mg by mouth every 8 (eight) hours as needed for pain.      [name] lurasidone (LATUDA) 40 MG TABS Take 40 mg by mouth at bedtime.      [name] oxyCODONE-acetaminophen (PERCOCET) 10-325 MG per tablet Take 1-2 tablets by mouth every 4 (four) hours as needed for pain.       . traZODone (DESYREL) 50 MG tablet Take 50 mg by mouth 3 (three) times daily as needed for sleep (muscle spasm).        Exam:  BP 116/85  Pulse 115  Temp(Src) 98.1 F (36.7 C) (Oral)  Resp 18  SpO2 90% Gen: Well NAD HEENT: EOMI (pain-free without blurry vision) ,  MMM, PERRLA.  Right eye Mild puffiness surrounding the eyelids. No conjunctival  injection Lungs: CTABL Nl WOB Heart: RRR no MRG Abd: NABS, NT, ND Exts: Non edematous BL  LE, warm and well perfused.  Neuro: Alert and oriented normal balance and gait. Normal coordination.   No results found for this or any previous visit (from the past 24 hour(s)). No results found.  Assessment and Plan: 37 y.o. female with possible preseptal cellulitis versus allergic reaction.  Plan to treat with doxycycline and Zyrtec.  Discussed warning signs of worsening headache and post septal cellulitis.  Discussed warning signs or symptoms. Please see discharge instructions. Patient expresses understanding. Followup with primary care provider as needed     Rodolph Bong, MD 04/23/13 1312

## 2013-05-23 ENCOUNTER — Encounter (HOSPITAL_COMMUNITY): Payer: Self-pay | Admitting: Emergency Medicine

## 2013-05-23 ENCOUNTER — Emergency Department (HOSPITAL_COMMUNITY)
Admission: EM | Admit: 2013-05-23 | Discharge: 2013-05-23 | Disposition: A | Discharge status: Home / Self Care | Payer: Medicaid Other | Attending: Emergency Medicine | Admitting: Emergency Medicine

## 2013-05-23 DIAGNOSIS — Z23 Encounter for immunization: Secondary | ICD-10-CM | POA: Insufficient documentation

## 2013-05-23 DIAGNOSIS — X19XXXA Contact with other heat and hot substances, initial encounter: Secondary | ICD-10-CM | POA: Insufficient documentation

## 2013-05-23 DIAGNOSIS — Y929 Unspecified place or not applicable: Secondary | ICD-10-CM | POA: Insufficient documentation

## 2013-05-23 DIAGNOSIS — J449 Chronic obstructive pulmonary disease, unspecified: Secondary | ICD-10-CM | POA: Insufficient documentation

## 2013-05-23 DIAGNOSIS — Z79899 Other long term (current) drug therapy: Secondary | ICD-10-CM | POA: Insufficient documentation

## 2013-05-23 DIAGNOSIS — F411 Generalized anxiety disorder: Secondary | ICD-10-CM | POA: Insufficient documentation

## 2013-05-23 DIAGNOSIS — Y9389 Activity, other specified: Secondary | ICD-10-CM | POA: Insufficient documentation

## 2013-05-23 DIAGNOSIS — T148XXA Other injury of unspecified body region, initial encounter: Secondary | ICD-10-CM

## 2013-05-23 DIAGNOSIS — G43909 Migraine, unspecified, not intractable, without status migrainosus: Secondary | ICD-10-CM | POA: Insufficient documentation

## 2013-05-23 DIAGNOSIS — J4489 Other specified chronic obstructive pulmonary disease: Secondary | ICD-10-CM | POA: Insufficient documentation

## 2013-05-23 DIAGNOSIS — F319 Bipolar disorder, unspecified: Secondary | ICD-10-CM | POA: Insufficient documentation

## 2013-05-23 DIAGNOSIS — X500XXA Overexertion from strenuous movement or load, initial encounter: Secondary | ICD-10-CM | POA: Insufficient documentation

## 2013-05-23 DIAGNOSIS — T2124XA Burn of second degree of lower back, initial encounter: Secondary | ICD-10-CM | POA: Insufficient documentation

## 2013-05-23 DIAGNOSIS — F17200 Nicotine dependence, unspecified, uncomplicated: Secondary | ICD-10-CM | POA: Insufficient documentation

## 2013-05-23 MED ORDER — CYCLOBENZAPRINE HCL 10 MG PO TABS: 10.0000 mg | ORAL_TABLET | Freq: Two times a day (BID) | ORAL | Status: DC | PRN

## 2013-05-23 MED ORDER — IBUPROFEN 800 MG PO TABS: 800.0000 mg | ORAL_TABLET | Freq: Three times a day (TID) | ORAL | Status: DC | PRN

## 2013-05-23 MED ORDER — TETANUS-DIPHTH-ACELL PERTUSSIS 5-2.5-18.5 LF-MCG/0.5 IM SUSP
0.5000 mL | Freq: Once | INTRAMUSCULAR | Status: AC
  Administered 2013-05-23: 0.5 mL via INTRAMUSCULAR
  Filled 2013-05-23: qty 0.5
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
## 2013-05-23 NOTE — ED Notes (Signed)
Rechecked blood pressure it was 109/81
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
## 2013-05-23 NOTE — ED Provider Notes (Signed)
Medical screening examination/treatment/procedure(s) were performed by non-physician practitioner and as supervising physician I was immediately available for consultation/collaboration.   [name], MD 05/23/13 1539 

## 2013-10-02 ENCOUNTER — Encounter (HOSPITAL_COMMUNITY): Payer: Self-pay | Admitting: Psychiatry

## 2013-10-02 ENCOUNTER — Encounter (INDEPENDENT_AMBULATORY_CARE_PROVIDER_SITE_OTHER): Payer: Self-pay

## 2013-10-02 ENCOUNTER — Ambulatory Visit (INDEPENDENT_AMBULATORY_CARE_PROVIDER_SITE_OTHER): Payer: Medicaid Other | Admitting: Psychiatry

## 2013-10-02 VITALS — BP 118/76 | HR 92 | Ht 65.0 in | Wt 199.2 lb

## 2013-10-02 DIAGNOSIS — F411 Generalized anxiety disorder: Secondary | ICD-10-CM

## 2013-10-02 DIAGNOSIS — F1210 Cannabis abuse, uncomplicated: Secondary | ICD-10-CM

## 2013-10-02 DIAGNOSIS — F319 Bipolar disorder, unspecified: Secondary | ICD-10-CM

## 2013-10-02 MED ORDER — LAMOTRIGINE 25 MG PO TABS: ORAL_TABLET | ORAL | Status: DC
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
## 2013-10-08 ENCOUNTER — Other Ambulatory Visit: Payer: Self-pay

## 2013-10-08 DIAGNOSIS — Z1231 Encounter for screening mammogram for malignant neoplasm of breast: Secondary | ICD-10-CM

## 2013-10-23 ENCOUNTER — Encounter (HOSPITAL_COMMUNITY): Payer: Self-pay | Admitting: Psychiatry

## 2013-10-23 ENCOUNTER — Ambulatory Visit
Admission: RE | Admit: 2013-10-23 | Discharge: 2013-10-23 | Disposition: A | Discharge status: Home / Self Care | Payer: Medicaid Other | Source: Ambulatory Visit

## 2013-10-23 ENCOUNTER — Ambulatory Visit (INDEPENDENT_AMBULATORY_CARE_PROVIDER_SITE_OTHER): Payer: Medicaid Other | Admitting: Psychiatry

## 2013-10-23 VITALS — BP 122/84 | HR 112 | Ht 65.0 in | Wt 201.8 lb

## 2013-10-23 DIAGNOSIS — F1210 Cannabis abuse, uncomplicated: Secondary | ICD-10-CM

## 2013-10-23 DIAGNOSIS — Z1231 Encounter for screening mammogram for malignant neoplasm of breast: Secondary | ICD-10-CM

## 2013-10-23 DIAGNOSIS — F319 Bipolar disorder, unspecified: Secondary | ICD-10-CM

## 2013-10-23 MED ORDER — CLONAZEPAM 0.5 MG PO TABS: 0.5000 mg | ORAL_TABLET | Freq: Two times a day (BID) | ORAL | Status: DC

## 2013-10-23 MED ORDER — LAMOTRIGINE 100 MG PO TABS: ORAL_TABLET | ORAL | Status: DC
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
## 2013-11-20 ENCOUNTER — Encounter (HOSPITAL_COMMUNITY): Payer: Self-pay | Admitting: Psychiatry

## 2013-11-20 ENCOUNTER — Other Ambulatory Visit (HOSPITAL_COMMUNITY): Payer: Self-pay | Admitting: Psychiatry

## 2013-11-20 ENCOUNTER — Ambulatory Visit (INDEPENDENT_AMBULATORY_CARE_PROVIDER_SITE_OTHER): Payer: Medicaid Other | Admitting: Psychiatry

## 2013-11-20 ENCOUNTER — Other Ambulatory Visit (HOSPITAL_COMMUNITY): Payer: Self-pay | Admitting: *Deleted

## 2013-11-20 VITALS — BP 160/102 | HR 111 | Ht 65.0 in | Wt 206.4 lb

## 2013-11-20 DIAGNOSIS — F410 Panic disorder [episodic paroxysmal anxiety] without agoraphobia: Secondary | ICD-10-CM

## 2013-11-20 DIAGNOSIS — F314 Bipolar disorder, current episode depressed, severe, without psychotic features: Secondary | ICD-10-CM | POA: Insufficient documentation

## 2013-11-20 DIAGNOSIS — F319 Bipolar disorder, unspecified: Secondary | ICD-10-CM

## 2013-11-20 DIAGNOSIS — F3130 Bipolar disorder, current episode depressed, mild or moderate severity, unspecified: Secondary | ICD-10-CM

## 2013-11-20 MED ORDER — GABAPENTIN 600 MG PO TABS: 600.0000 mg | ORAL_TABLET | Freq: Three times a day (TID) | ORAL | Status: DC

## 2013-11-20 MED ORDER — LURASIDONE HCL 40 MG PO TABS: 80.0000 mg | ORAL_TABLET | Freq: Every day | ORAL | Status: DC

## 2013-11-20 MED ORDER — LAMOTRIGINE 100 MG PO TABS: ORAL_TABLET | ORAL | Status: DC

## 2013-11-20 MED ORDER — CLONAZEPAM 0.5 MG PO TABS: 0.5000 mg | ORAL_TABLET | Freq: Two times a day (BID) | ORAL | Status: DC

## 2013-11-20 NOTE — Telephone Encounter (Deleted)
r 
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
## 2013-12-18 ENCOUNTER — Ambulatory Visit: Payer: Medicaid Other | Attending: Anesthesiology | Admitting: Physical Therapy

## 2013-12-18 DIAGNOSIS — IMO0001 Reserved for inherently not codable concepts without codable children: Secondary | ICD-10-CM | POA: Diagnosis present

## 2013-12-18 DIAGNOSIS — M545 Low back pain, unspecified: Secondary | ICD-10-CM | POA: Diagnosis not present

## 2013-12-18 DIAGNOSIS — R293 Abnormal posture: Secondary | ICD-10-CM | POA: Insufficient documentation

## 2013-12-23 ENCOUNTER — Ambulatory Visit (INDEPENDENT_AMBULATORY_CARE_PROVIDER_SITE_OTHER): Payer: Medicaid Other | Admitting: Psychiatry

## 2013-12-23 ENCOUNTER — Encounter (HOSPITAL_COMMUNITY): Payer: Self-pay | Admitting: Psychiatry

## 2013-12-23 ENCOUNTER — Other Ambulatory Visit (HOSPITAL_COMMUNITY): Payer: Self-pay | Admitting: *Deleted

## 2013-12-23 ENCOUNTER — Telehealth (HOSPITAL_COMMUNITY): Payer: Self-pay | Admitting: *Deleted

## 2013-12-23 VITALS — BP 124/88 | HR 96 | Ht 65.0 in | Wt 209.4 lb

## 2013-12-23 DIAGNOSIS — G4700 Insomnia, unspecified: Secondary | ICD-10-CM

## 2013-12-23 DIAGNOSIS — F3130 Bipolar disorder, current episode depressed, mild or moderate severity, unspecified: Secondary | ICD-10-CM

## 2013-12-23 DIAGNOSIS — F319 Bipolar disorder, unspecified: Secondary | ICD-10-CM

## 2013-12-23 MED ORDER — LAMOTRIGINE 100 MG PO TABS: ORAL_TABLET | ORAL | Status: DC

## 2013-12-23 MED ORDER — LURASIDONE HCL 40 MG PO TABS: 80.0000 mg | ORAL_TABLET | Freq: Every day | ORAL | Status: DC

## 2013-12-23 MED ORDER — GABAPENTIN 600 MG PO TABS: 600.0000 mg | ORAL_TABLET | Freq: Three times a day (TID) | ORAL | Status: DC

## 2013-12-23 MED ORDER — CLONAZEPAM 0.5 MG PO TABS: 0.5000 mg | ORAL_TABLET | Freq: Two times a day (BID) | ORAL | Status: DC

## 2013-12-23 MED ORDER — LAMOTRIGINE 25 MG PO TABS: 25.0000 mg | ORAL_TABLET | Freq: Two times a day (BID) | ORAL | Status: DC

## 2013-12-23 MED ORDER — LAMOTRIGINE 25 MG PO TABS: 25.0000 mg | ORAL_TABLET | Freq: Every day | ORAL | Status: DC

## 2013-12-23 MED ORDER — CLONIDINE HCL 0.1 MG PO TABS: 0.1000 mg | ORAL_TABLET | Freq: Every day | ORAL | Status: DC

## 2013-12-23 MED ORDER — GABAPENTIN 300 MG PO CAPS: 600.0000 mg | ORAL_CAPSULE | Freq: Three times a day (TID) | ORAL | Status: DC

## 2013-12-23 NOTE — Telephone Encounter (Signed)
Patient left [id number]:Latuda ordered for only 15 days,supposed to be 2/day.Lamotrigine  ordered only 100 mg,supposed to be 125 mg.Can't get Gabapentin at all. Please call.  Reviewed medication orders with provider.Provider re-ordered Latuda 40 mg tablets,2/day,#60. Lamotrigine 25 mg initially ordered 2/day, changed to 1/day - to take WITH lamotrigine 100 mg daily for total dose of 125 mg daily. Gabapentin changed earlier to 300 mg, take 2 tablets TID after pharmacy notified that other dose requested not covered by insurance. [facility]spoke with Patsy LagerYolanda to review medication changes.She states that new Latuda prescription can be picked up in 15 days when current one ends.Informed that Lamotrigine is is for TOTAL dose of 125 mg, so patient needs #30 100 mg and #30 25 mg. Contacted patient to update on prescriptions

## 2013-12-23 NOTE — Telephone Encounter (Signed)
Received Fax from pharmacy stating Insurance will not cover Gabapentin 600 mg tablet TID. Request changing to 300mg , take 2, TID. Provider authorized change. Order changed in chart Called pharmacy, gave information to Honeywell[employer]er.

## 2013-12-23 NOTE — Addendum Note (Signed)
Addended by: [name], Dorthia Tout on: 12/23/2013 04:37 PM   Modules accepted: Orders

## 2013-12-23 NOTE — Progress Notes (Signed)
   [facility] Follow-up Outpatient Visit  Nancy Bowers 05/25/1976  Date:  47/15 Subjective: Patient is here for follow up Latuda 80 mg po QHS x 15 days, clonazepam 0.5 mg, 2 times daily, lamictal 100 mg po QD, and gabapentin 300 mg, 2 tabs three times daily, and pharmacy wouldn't dispense it.  Sleeping 4 hours of sleep, appetite is poor. Mood is cranky, been off latuda x 15 days. She does well with the medication. She denies SI/HI/AVH. Depression-8/10, Anxiety 10/10. No side effects with medication.   There were no vitals filed for this visit.  Mental Status Examination  Appearance: casual  Alert: Yes Attention: poor Cooperative: Yes Eye Contact: Fair Speech: mildly pressured Psychomotor Activity: Normal Memory/Concentration: fair  Oriented: time/date, day of week and month of year Mood: Anxious, Depressed, Dysphoric and Irritable Affect: Labile Thought Processes and Associations: Circumstantial and Irrelevant Fund of Knowledge: Poor Thought Content: preoccupations Insight: Poor Judgement: Poor  Diagnosis:  Bipolar, depression Insomnia  Treatment Plan:  Lamictal 100 mg, 25 mg po QD Latuda 80 mg QHS Clonidine 0.1 mg HS for sleep  Clonazepam 0.5 mg, 2 times daily  Gabapentin 300 mg, 2 tabs three times daily  [name], [name], NP

## 2014-01-09 ENCOUNTER — Ambulatory Visit (INDEPENDENT_AMBULATORY_CARE_PROVIDER_SITE_OTHER): Payer: Medicaid Other | Admitting: Psychiatry

## 2014-01-09 DIAGNOSIS — F319 Bipolar disorder, unspecified: Secondary | ICD-10-CM
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
## 2014-01-22 ENCOUNTER — Ambulatory Visit (INDEPENDENT_AMBULATORY_CARE_PROVIDER_SITE_OTHER): Payer: Medicaid Other | Admitting: Psychiatry

## 2014-01-22 ENCOUNTER — Encounter (HOSPITAL_COMMUNITY): Payer: Self-pay | Admitting: Psychiatry

## 2014-01-22 VITALS — BP 146/80 | HR 105 | Ht 65.0 in | Wt 206.8 lb

## 2014-01-22 DIAGNOSIS — F3130 Bipolar disorder, current episode depressed, mild or moderate severity, unspecified: Secondary | ICD-10-CM

## 2014-01-22 DIAGNOSIS — F909 Attention-deficit hyperactivity disorder, unspecified type: Secondary | ICD-10-CM

## 2014-01-22 DIAGNOSIS — F411 Generalized anxiety disorder: Secondary | ICD-10-CM | POA: Insufficient documentation

## 2014-01-22 DIAGNOSIS — F319 Bipolar disorder, unspecified: Secondary | ICD-10-CM

## 2014-01-22 MED ORDER — CLONAZEPAM 0.5 MG PO TABS: 0.5000 mg | ORAL_TABLET | Freq: Two times a day (BID) | ORAL | Status: DC

## 2014-01-22 MED ORDER — LURASIDONE HCL 40 MG PO TABS: 80.0000 mg | ORAL_TABLET | Freq: Every day | ORAL | Status: DC

## 2014-01-22 MED ORDER — CLONIDINE HCL 0.2 MG PO TABS: 0.1000 mg | ORAL_TABLET | Freq: Every day | ORAL | Status: DC

## 2014-01-22 MED ORDER — TRAZODONE HCL 100 MG PO TABS: 100.0000 mg | ORAL_TABLET | Freq: Every day | ORAL | Status: DC

## 2014-01-22 MED ORDER — LAMOTRIGINE 150 MG PO TABS: ORAL_TABLET | ORAL | Status: DC

## 2014-01-22 MED ORDER — AMPHETAMINE-DEXTROAMPHETAMINE 10 MG PO TABS: 10.0000 mg | ORAL_TABLET | Freq: Every day | ORAL | Status: DC

## 2014-01-22 NOTE — Progress Notes (Signed)
   [facility]  Nancy Bowers 06/05/1976  Date:  01/21/14  Subjective: Patient is here for follow up Bipolar, depressed and Anxiety, unspecified Sleeping is poor, getting 4 hours of sleep; appetite is poor. Mood is labile. Patient is on pain meds for LBP; patient takes percocet for medicine. Gabapentin is not effective; patient wants to come off gabapentin. The medications patient is on: clonazepam 0.5 mg po, 2 times daily, clonidine 0.1 mg HS, Lamictal 100 mg, 25 mg po QD, latuda 80 mg po with food. No side effects from the medications. Depression is 8/10, Anxiety is 10/10. Panic attacks, 3 times a week.Patient is distractible, impulsive. Screened for ADHD. UDS negative-per pcp Patient denies addiction history. Irritable, slightly pressured. Will trial Adderall; discussed risks, benefits, and side effects with meds, ie benzo's and pain meds. Patient understands risk of respiratory depression. Rtc in 4 weeks.   There were no vitals filed for this visit.  Mental Status Examination  Appearance: casual, disheveled Alert: Yes Attention: poor Cooperative: Yes Eye Contact: Fair Speech: mildly pressured  Psychomotor Activity: Restlessness Memory/Concentration: Fair  Oriented: time/date and day of week Mood: Anxious and Irritable Affect: Inappropriate and Labile Thought Processes and Associations: Circumstantial Fund of Knowledge: Fair Thought Content: preoccupations Insight: Fair Judgement: Fair  Diagnosis:  Bipolar, depression Anxiety, unspecified.  ADHD  Treatment Plan:  Rtc 4 weeks Clonazepam 0.5 mg, 2 times daily DC gabapentin Lamictal 150 mg po QD Latuda 80 mg po QD Adderall 10 mg po AM LBP-4 tablets 10-325 percocet tablets  Nancy Bowers, Nancy Mcbrien, NP

## 2014-02-19 ENCOUNTER — Telehealth (HOSPITAL_COMMUNITY): Payer: Self-pay

## 2014-02-19 DIAGNOSIS — F319 Bipolar disorder, unspecified: Secondary | ICD-10-CM

## 2014-02-19 DIAGNOSIS — F909 Attention-deficit hyperactivity disorder, unspecified type: Secondary | ICD-10-CM

## 2014-02-19 MED ORDER — LURASIDONE HCL 40 MG PO TABS: 80.0000 mg | ORAL_TABLET | Freq: Every day | ORAL | Status: DC

## 2014-02-19 MED ORDER — LAMOTRIGINE 150 MG PO TABS: ORAL_TABLET | ORAL | Status: DC

## 2014-02-19 MED ORDER — AMPHETAMINE-DEXTROAMPHETAMINE 10 MG PO TABS: 10.0000 mg | ORAL_TABLET | Freq: Every day | ORAL | Status: DC

## 2014-02-19 MED ORDER — CLONAZEPAM 0.5 MG PO TABS: 0.5000 mg | ORAL_TABLET | Freq: Two times a day (BID) | ORAL | Status: DC

## 2014-02-19 NOTE — Telephone Encounter (Signed)
Will fill her meds to bridge appointment, and make changes when i see patient.

## 2014-02-19 NOTE — Telephone Encounter (Signed)
02/19/14 4:41pm Patient came and pick -up rx script  [id number].Marland KitchenMarguerite Bowers

## 2014-02-19 NOTE — Telephone Encounter (Signed)
Medications filled Clonidine was discontinued previous appt, so not filled - per provider instructions Phoned pt - left message: No dose change until seen at appt per provider

## 2014-02-23 ENCOUNTER — Ambulatory Visit (HOSPITAL_COMMUNITY): Payer: Self-pay | Admitting: Psychiatry

## 2014-03-26 ENCOUNTER — Encounter (HOSPITAL_COMMUNITY): Payer: Self-pay | Admitting: Psychiatry

## 2014-03-26 ENCOUNTER — Ambulatory Visit (INDEPENDENT_AMBULATORY_CARE_PROVIDER_SITE_OTHER): Payer: Medicaid Other | Admitting: Psychiatry

## 2014-03-26 VITALS — BP 120/80 | HR 84 | Ht 67.0 in | Wt 206.0 lb

## 2014-03-26 DIAGNOSIS — F319 Bipolar disorder, unspecified: Secondary | ICD-10-CM

## 2014-03-26 DIAGNOSIS — F909 Attention-deficit hyperactivity disorder, unspecified type: Secondary | ICD-10-CM

## 2014-03-26 MED ORDER — LAMOTRIGINE 150 MG PO TABS: 150.0000 mg | ORAL_TABLET | Freq: Every day | ORAL | Status: DC

## 2014-03-26 MED ORDER — LURASIDONE HCL 40 MG PO TABS: 80.0000 mg | ORAL_TABLET | Freq: Every day | ORAL | Status: DC

## 2014-03-26 MED ORDER — AMPHETAMINE-DEXTROAMPHETAMINE 15 MG PO TABS: 15.0000 mg | ORAL_TABLET | Freq: Every day | ORAL | Status: DC

## 2014-03-26 MED ORDER — CLONAZEPAM 0.5 MG PO TABS: 0.5000 mg | ORAL_TABLET | Freq: Two times a day (BID) | ORAL | Status: DC

## 2014-03-26 MED ORDER — TRAZODONE HCL 100 MG PO TABS: 100.0000 mg | ORAL_TABLET | Freq: Every day | ORAL | Status: DC

## 2014-03-26 NOTE — Progress Notes (Signed)
   [facility]  Richelle ItoCarey Koon 03/16/1976  Date:   03/26/14 Subjective: Pt is here for follow up Sleeping 3-4 hours; appetite is good. She reports she lost 40 lbs. Mood is anxious. She is fidgety, and restless. She ran out of meds x 2 weeks. She denies SI/HI/AVH. Tolerating the medication; discussed abruptly stopping the lamictal. She is taking the latuda correctly, with food. Concentration is poor, and circumstantial. Rtc in 4 weeks.   There were no vitals filed for this visit.  Mental Status Examination  Appearance: casual Alert: Yes Attention: fair  Cooperative: Yes Eye Contact: Fair Speech: mildly pressured  Psychomotor Activity: Restlessness Memory/Concentration: fair  Oriented: time/date and day of week Mood: Anxious Affect: Constricted Thought Processes and Associations: Circumstantial Fund of Knowledge: Fair Thought Content: preoccupations Insight: Fair Judgement: Fair  Diagnosis:  Adhd, combined type Bipolar 1, depressed Insomnia Anxiety, unspecified  Treatment Plan:  Adderall 15 mg po am for adhd latuda 40 mg x 2 for bipolar, depression Lamictal 150 mg for mood Clonazepam 0.5 mg, bid for anxiety Trazodone 100 mg x 2 for insomnia   [name], Nathalee Smarr, NP

## 2014-05-01 ENCOUNTER — Encounter (HOSPITAL_COMMUNITY): Payer: Self-pay | Admitting: Psychiatry

## 2014-05-01 ENCOUNTER — Ambulatory Visit (INDEPENDENT_AMBULATORY_CARE_PROVIDER_SITE_OTHER): Payer: Medicaid Other | Admitting: Psychiatry

## 2014-05-01 VITALS — BP 120/80 | HR 78 | Ht 67.0 in | Wt 206.0 lb

## 2014-05-01 DIAGNOSIS — F3162 Bipolar disorder, current episode mixed, moderate: Secondary | ICD-10-CM

## 2014-05-01 DIAGNOSIS — F909 Attention-deficit hyperactivity disorder, unspecified type: Secondary | ICD-10-CM

## 2014-05-01 DIAGNOSIS — F319 Bipolar disorder, unspecified: Secondary | ICD-10-CM

## 2014-05-01 MED ORDER — LAMOTRIGINE 150 MG PO TABS: 150.0000 mg | ORAL_TABLET | Freq: Every day | ORAL | Status: DC

## 2014-05-01 MED ORDER — AMPHETAMINE-DEXTROAMPHETAMINE 20 MG PO TABS: 20.0000 mg | ORAL_TABLET | Freq: Two times a day (BID) | ORAL | Status: DC

## 2014-05-01 MED ORDER — CLONAZEPAM 0.5 MG PO TABS: 0.5000 mg | ORAL_TABLET | Freq: Two times a day (BID) | ORAL | Status: DC

## 2014-05-01 MED ORDER — MIRTAZAPINE 15 MG PO TABS: 15.0000 mg | ORAL_TABLET | Freq: Every day | ORAL | Status: DC

## 2014-05-01 MED ORDER — AMPHETAMINE-DEXTROAMPHETAMINE 15 MG PO TABS: 15.0000 mg | ORAL_TABLET | Freq: Every day | ORAL | Status: DC

## 2014-05-01 MED ORDER — LURASIDONE HCL 40 MG PO TABS: 80.0000 mg | ORAL_TABLET | Freq: Every day | ORAL | Status: DC

## 2014-05-01 NOTE — Progress Notes (Signed)
   [facility]  Nancy ItoCarey Bowers 08/29/1976  Date:  05/01/14 Subjective: Pt is here for follow up ADHD, and Bipolar 1, mixed, moderate Sleeping and eating are poor. Mood is irritable, out of medications x 1 week. Patient is labile, since not having her medications. She didn't check with pharmacy for refills. She has poor concentration. She appears disheveled, and smells of nicotine. She's here with son and husband. Tolerating her medications. Would like to go up on Adderall to 20 mg, 2 times daily for concentration, and she wants to try something else for sleep. Will trial Mirtazapine 15 mg po hs. Discussed RRBO of medications with patient. She denies Si/hi/avh. rtc in 4 weeks.   There were no vitals filed for this visit.  Mental Status Examination  Appearance: casual  Alert: Yes Attention: poor Cooperative: Yes Eye Contact: Fair Speech: garbled Psychomotor Activity: Restlessness Memory/Concentration: poor Oriented: time/date and day of week Mood: Anxious and Dysphoric Affect: Labile Thought Processes and Associations: Circumstantial Fund of Knowledge: Fair Thought Content: preoccupations Insight: Fair Judgement: Fair  Diagnosis:  Bipolar 1, mixed, moderate ADHD Treatment Plan:  Clonazepam 0.5 mg, 2 times a day for anxiety Latuda 80 mg po daily for bipolar, depression Lamictal 150 mg po for mood stabilization Mirtazapine 15 mg hs for depression  [name], Nancy Stoneman, NP

## 2014-05-29 ENCOUNTER — Ambulatory Visit (INDEPENDENT_AMBULATORY_CARE_PROVIDER_SITE_OTHER): Payer: Medicaid Other | Admitting: Psychiatry

## 2014-05-29 ENCOUNTER — Encounter (HOSPITAL_COMMUNITY): Payer: Self-pay | Admitting: Psychiatry

## 2014-05-29 VITALS — BP 124/80 | HR 78 | Ht 67.0 in | Wt 190.6 lb

## 2014-05-29 DIAGNOSIS — F319 Bipolar disorder, unspecified: Secondary | ICD-10-CM

## 2014-05-29 DIAGNOSIS — F39 Unspecified mood [affective] disorder: Secondary | ICD-10-CM

## 2014-05-29 DIAGNOSIS — F909 Attention-deficit hyperactivity disorder, unspecified type: Secondary | ICD-10-CM

## 2014-05-29 MED ORDER — LAMOTRIGINE 200 MG PO TABS: 200.0000 mg | ORAL_TABLET | Freq: Every day | ORAL | Status: DC

## 2014-05-29 MED ORDER — CLONAZEPAM 0.5 MG PO TABS: 0.5000 mg | ORAL_TABLET | Freq: Two times a day (BID) | ORAL | Status: DC

## 2014-05-29 MED ORDER — LURASIDONE HCL 40 MG PO TABS: 80.0000 mg | ORAL_TABLET | Freq: Every day | ORAL | Status: DC

## 2014-05-29 MED ORDER — AMPHETAMINE-DEXTROAMPHETAMINE 30 MG PO TABS: 30.0000 mg | ORAL_TABLET | Freq: Two times a day (BID) | ORAL | Status: DC

## 2014-05-29 MED ORDER — MIRTAZAPINE 30 MG PO TABS: 30.0000 mg | ORAL_TABLET | Freq: Every day | ORAL | Status: DC

## 2014-05-29 NOTE — Progress Notes (Addendum)
   [facility] Follow[facility] Visit  Nancy Bowers [birth year]  Date:  05/29/14 Subjective:  Pt is here for follow up.  She is disheveled, malodorous and smells of smoke. She has streaks of purple and pink hair. Noted to be fidgety, anxious, appearance. Husband with her, and spoke up about increasing the adderall. She reports her eating and sleeping are poor. Mood is," bitchy." Labile mood. No side effects from the medications. She is tolerating well. Concentration is fair to poor. Pt denies substance abuse, and has chronic pain issues. She has dependent, needy personality.Rtc in 4 weeks.   There were no vitals filed for this visit.  Mental Status Examination  Appearance: casual  Alert: Yes Attention: fair  Cooperative: Yes Eye Contact: Fair Speech: garbled Psychomotor Activity: Restlessness Memory/Concentration: fair  Oriented: time/date and day of week Mood: Anxious, Dysphoric and Irritable Affect: Labile Thought Processes and Associations: Circumstantial Fund of Knowledge: Fair Thought Content: preoccupations Insight: Fair Judgement: Fair  Diagnosis:  Episodic Mood disorder ADHD  Treatment Plan:  Rtc in 4 weeks Adder all 40 mg po, 2 times daily for AD HD Lamictal 200 mg po daily for mood stabilization Latuda 80 mg po for mood stabilization; she takes with food Mirtazapine 30 mg po for sleep   Kendrick Fries, NP

## 2014-06-22 ENCOUNTER — Telehealth (HOSPITAL_COMMUNITY): Payer: Self-pay

## 2014-06-23 ENCOUNTER — Telehealth (HOSPITAL_COMMUNITY): Payer: Self-pay

## 2014-06-23 ENCOUNTER — Other Ambulatory Visit (HOSPITAL_COMMUNITY): Payer: Self-pay | Admitting: Psychiatry

## 2014-06-23 DIAGNOSIS — F988 Other specified behavioral and emotional disorders with onset usually occurring in childhood and adolescence: Secondary | ICD-10-CM

## 2014-06-23 DIAGNOSIS — F319 Bipolar disorder, unspecified: Secondary | ICD-10-CM

## 2014-06-23 MED ORDER — LAMOTRIGINE 200 MG PO TABS: 200.0000 mg | ORAL_TABLET | Freq: Every day | ORAL | Status: DC

## 2014-06-23 MED ORDER — LURASIDONE HCL 40 MG PO TABS: 80.0000 mg | ORAL_TABLET | Freq: Every day | ORAL | Status: DC

## 2014-06-23 MED ORDER — CLONAZEPAM 0.5 MG PO TABS: 0.5000 mg | ORAL_TABLET | Freq: Two times a day (BID) | ORAL | Status: DC

## 2014-06-23 MED ORDER — AMPHETAMINE-DEXTROAMPHETAMINE 30 MG PO TABS: 30.0000 mg | ORAL_TABLET | Freq: Two times a day (BID) | ORAL | Status: DC

## 2014-06-23 MED ORDER — MIRTAZAPINE 30 MG PO TABS: 30.0000 mg | ORAL_TABLET | Freq: Every day | ORAL | Status: DC

## 2014-06-23 NOTE — Telephone Encounter (Signed)
06/23/14 2:05pm Patient came and pick-up rx script's [id number].[name].Marguerite Olea/sh

## 2014-06-25 ENCOUNTER — Telehealth (HOSPITAL_COMMUNITY): Payer: Self-pay | Admitting: *Deleted

## 2014-06-25 NOTE — Telephone Encounter (Signed)
Pt continues to call stating "[facility] won't fill prescriptions" stating [place] [facility] needs notified. Called [place] [facility] who states no issues on their end with the NPI or DEA of Dr Michae KavaAgarwal that it is an issue with [facility] and they need to call their help desk. Explained this to [facility] personal "Shawna OrleansMelanie" and she will call them to fix problem. Pt was called and explained that [facility] but she could switch pharmacies if medications are not filled tonight. Pt agreeable. Pt will call back tomorrow if she needs further assistance. Dr. Lucianne MussKumar also offered help in filling prescriptions by calling in her DEA to fill tonight but 2 medications need hard copies.

## 2014-06-25 NOTE — Telephone Encounter (Signed)
Pt called stating she has not received her medication due to needing it called to [place] [facility]. Asking that we call her pharmacy to find out what needs to be done.

## 2014-06-29 ENCOUNTER — Ambulatory Visit (HOSPITAL_COMMUNITY): Payer: Self-pay | Admitting: Psychiatry

## 2014-07-16 ENCOUNTER — Ambulatory Visit (INDEPENDENT_AMBULATORY_CARE_PROVIDER_SITE_OTHER): Payer: Medicaid Other | Admitting: Psychiatry

## 2014-07-16 ENCOUNTER — Encounter (HOSPITAL_COMMUNITY): Payer: Self-pay | Admitting: Psychiatry

## 2014-07-16 VITALS — BP 129/83 | HR 105 | Ht 67.0 in | Wt 203.8 lb

## 2014-07-16 DIAGNOSIS — F411 Generalized anxiety disorder: Secondary | ICD-10-CM

## 2014-07-16 DIAGNOSIS — F3160 Bipolar disorder, current episode mixed, unspecified: Secondary | ICD-10-CM

## 2014-07-16 DIAGNOSIS — F319 Bipolar disorder, unspecified: Secondary | ICD-10-CM

## 2014-07-16 DIAGNOSIS — F909 Attention-deficit hyperactivity disorder, unspecified type: Secondary | ICD-10-CM

## 2014-07-16 DIAGNOSIS — F988 Other specified behavioral and emotional disorders with onset usually occurring in childhood and adolescence: Secondary | ICD-10-CM

## 2014-07-16 MED ORDER — LAMOTRIGINE 200 MG PO TABS: 200.0000 mg | ORAL_TABLET | Freq: Every day | ORAL | Status: DC

## 2014-07-16 MED ORDER — LURASIDONE HCL 40 MG PO TABS: 80.0000 mg | ORAL_TABLET | Freq: Every day | ORAL | Status: DC

## 2014-07-16 MED ORDER — AMITRIPTYLINE HCL 25 MG PO TABS: 25.0000 mg | ORAL_TABLET | Freq: Every day | ORAL | Status: DC

## 2014-07-16 MED ORDER — CLONAZEPAM 0.5 MG PO TABS: 0.5000 mg | ORAL_TABLET | Freq: Two times a day (BID) | ORAL | Status: DC

## 2014-07-16 MED ORDER — AMPHETAMINE-DEXTROAMPHETAMINE 30 MG PO TABS: 30.0000 mg | ORAL_TABLET | Freq: Two times a day (BID) | ORAL | Status: DC
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
## 2014-07-17 DIAGNOSIS — F319 Bipolar disorder, unspecified: Secondary | ICD-10-CM | POA: Insufficient documentation

## 2014-07-17 DIAGNOSIS — F988 Other specified behavioral and emotional disorders with onset usually occurring in childhood and adolescence: Secondary | ICD-10-CM | POA: Insufficient documentation

## 2014-07-17 DIAGNOSIS — F411 Generalized anxiety disorder: Secondary | ICD-10-CM | POA: Insufficient documentation

## 2014-07-23 ENCOUNTER — Other Ambulatory Visit (HOSPITAL_COMMUNITY): Payer: Self-pay | Admitting: Psychiatry

## 2014-08-17 ENCOUNTER — Other Ambulatory Visit (HOSPITAL_COMMUNITY): Payer: Self-pay | Admitting: *Deleted

## 2014-08-17 DIAGNOSIS — F988 Other specified behavioral and emotional disorders with onset usually occurring in childhood and adolescence: Secondary | ICD-10-CM

## 2014-08-17 MED ORDER — AMPHETAMINE-DEXTROAMPHETAMINE 30 MG PO TABS: 30.0000 mg | ORAL_TABLET | Freq: Two times a day (BID) | ORAL | Status: DC

## 2014-08-17 MED ORDER — AMPHETAMINE-DEXTROAMPHETAMINE 30 MG PO TABS
30.0000 mg | ORAL_TABLET | Freq: Two times a day (BID) | ORAL | Status: DC
Start: 2014-08-17 — End: 2014-09-15

## 2014-08-17 NOTE — Telephone Encounter (Signed)
Reprinted RX for Adderall - did not print the first time

## 2014-08-18 ENCOUNTER — Telehealth (HOSPITAL_COMMUNITY): Payer: Self-pay

## 2014-08-18 NOTE — Telephone Encounter (Signed)
08/18/14 3:52PM Patient came and pick-up rx script [id number] #[id number].[name].Marguerite Olea/sh

## 2014-09-01 ENCOUNTER — Encounter: Payer: Self-pay | Admitting: Physical Therapy

## 2014-09-01 ENCOUNTER — Ambulatory Visit: Payer: Medicaid Other | Attending: Anesthesiology | Admitting: Physical Therapy

## 2014-09-01 DIAGNOSIS — M62830 Muscle spasm of back: Secondary | ICD-10-CM | POA: Diagnosis not present
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
## 2014-09-01 NOTE — Patient Instructions (Signed)
Instructed in sidelying abdominal brace 5 times with progress to add arm raises and hip/knee flexion as tolerated 3x/day. Sidelying clams 5x 3x/day. Continuation of walking program (short walks several times a day).

## 2014-09-15 ENCOUNTER — Ambulatory Visit (INDEPENDENT_AMBULATORY_CARE_PROVIDER_SITE_OTHER): Payer: Medicaid Other | Admitting: Psychiatry

## 2014-09-15 ENCOUNTER — Encounter (HOSPITAL_COMMUNITY): Payer: Self-pay | Admitting: Psychiatry

## 2014-09-15 VITALS — BP 113/68 | HR 86 | Ht 65.0 in | Wt 197.0 lb

## 2014-09-15 DIAGNOSIS — F319 Bipolar disorder, unspecified: Secondary | ICD-10-CM

## 2014-09-15 DIAGNOSIS — Z79899 Other long term (current) drug therapy: Secondary | ICD-10-CM

## 2014-09-15 DIAGNOSIS — F411 Generalized anxiety disorder: Secondary | ICD-10-CM

## 2014-09-15 DIAGNOSIS — F909 Attention-deficit hyperactivity disorder, unspecified type: Secondary | ICD-10-CM

## 2014-09-15 DIAGNOSIS — F988 Other specified behavioral and emotional disorders with onset usually occurring in childhood and adolescence: Secondary | ICD-10-CM

## 2014-09-15 MED ORDER — LURASIDONE HCL 40 MG PO TABS: 80.0000 mg | ORAL_TABLET | Freq: Every day | ORAL | Status: DC

## 2014-09-15 MED ORDER — AMPHETAMINE-DEXTROAMPHETAMINE 30 MG PO TABS: 30.0000 mg | ORAL_TABLET | Freq: Two times a day (BID) | ORAL | Status: DC

## 2014-09-15 MED ORDER — LAMOTRIGINE 200 MG PO TABS: 200.0000 mg | ORAL_TABLET | Freq: Every day | ORAL | Status: DC

## 2014-09-15 MED ORDER — CLONAZEPAM 0.5 MG PO TABS: 0.5000 mg | ORAL_TABLET | Freq: Two times a day (BID) | ORAL | Status: DC

## 2014-09-15 MED ORDER — AMITRIPTYLINE HCL 50 MG PO TABS: 100.0000 mg | ORAL_TABLET | Freq: Every day | ORAL | Status: DC

## 2014-09-15 NOTE — Patient Instructions (Signed)
Call [phone] to schedule EKG 
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
## 2014-10-08 ENCOUNTER — Encounter (HOSPITAL_COMMUNITY): Payer: Self-pay | Admitting: Emergency Medicine

## 2014-10-08 ENCOUNTER — Emergency Department (HOSPITAL_COMMUNITY)
Admission: EM | Admit: 2014-10-08 | Discharge: 2014-10-08 | Disposition: A | Discharge status: Home / Self Care | Payer: Medicaid Other | Attending: Emergency Medicine | Admitting: Emergency Medicine

## 2014-10-08 ENCOUNTER — Emergency Department (HOSPITAL_COMMUNITY): Payer: Medicaid Other

## 2014-10-08 DIAGNOSIS — Z720 Tobacco use: Secondary | ICD-10-CM | POA: Diagnosis not present

## 2014-10-08 DIAGNOSIS — J449 Chronic obstructive pulmonary disease, unspecified: Secondary | ICD-10-CM | POA: Diagnosis not present

## 2014-10-08 DIAGNOSIS — F419 Anxiety disorder, unspecified: Secondary | ICD-10-CM | POA: Insufficient documentation

## 2014-10-08 DIAGNOSIS — R05 Cough: Secondary | ICD-10-CM | POA: Diagnosis present

## 2014-10-08 DIAGNOSIS — J069 Acute upper respiratory infection, unspecified: Secondary | ICD-10-CM | POA: Diagnosis not present

## 2014-10-08 DIAGNOSIS — F329 Major depressive disorder, single episode, unspecified: Secondary | ICD-10-CM | POA: Insufficient documentation

## 2014-10-08 DIAGNOSIS — Z79899 Other long term (current) drug therapy: Secondary | ICD-10-CM | POA: Insufficient documentation

## 2014-10-08 DIAGNOSIS — G43909 Migraine, unspecified, not intractable, without status migrainosus: Secondary | ICD-10-CM | POA: Diagnosis not present

## 2014-10-08 DIAGNOSIS — J029 Acute pharyngitis, unspecified: Secondary | ICD-10-CM

## 2014-10-08 DIAGNOSIS — F909 Attention-deficit hyperactivity disorder, unspecified type: Secondary | ICD-10-CM | POA: Diagnosis not present

## 2014-10-08 LAB — RAPID STREP SCREEN (MED CTR MEBANE ONLY): Streptococcus, Group A Screen (Direct): NEGATIVE

## 2014-10-08 MED ORDER — SUCRALFATE 1 GM/10ML PO SUSP: 1.0000 g | Freq: Three times a day (TID) | ORAL | Status: DC

## 2014-10-08 MED ORDER — MAGIC MOUTHWASH
5.0000 mL | Freq: Once | ORAL | Status: AC
  Administered 2014-10-08: 5 mL via ORAL
  Filled 2014-10-08: qty 5

## 2014-10-08 MED ORDER — GUAIFENESIN 100 MG/5ML PO LIQD: 100.0000 mg | ORAL | Status: DC | PRN

## 2014-10-08 NOTE — ED Notes (Signed)
Peak flow ordered in error-not needed and/or completed
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
## 2014-10-08 NOTE — ED Notes (Signed)
Per pt, states cold symptoms for about 3 days-has not taken anything-c/o headache

## 2014-10-10 LAB — CULTURE, GROUP A STREP

## 2014-10-12 ENCOUNTER — Telehealth (HOSPITAL_COMMUNITY): Payer: Self-pay

## 2014-10-12 NOTE — Telephone Encounter (Signed)
Patient requests a refill of her Adderall which was last filled 09/16/15.  States will pick up when ready.

## 2014-10-13 ENCOUNTER — Other Ambulatory Visit (HOSPITAL_COMMUNITY): Payer: Self-pay | Admitting: Psychiatry

## 2014-10-13 DIAGNOSIS — F988 Other specified behavioral and emotional disorders with onset usually occurring in childhood and adolescence: Secondary | ICD-10-CM

## 2014-10-13 MED ORDER — AMPHETAMINE-DEXTROAMPHETAMINE 30 MG PO TABS: 30.0000 mg | ORAL_TABLET | Freq: Two times a day (BID) | ORAL | Status: DC

## 2014-10-19 NOTE — Telephone Encounter (Signed)
Patient called back requesting refill of Adderall which was written 10/13/14.  Prescription not located at front desk.  Will follow up with Dr. Michae KavaAgarwal to request reprint so patient can pick up on 10/20/14 as will be out after that date. Patient next evaluation set for 11/17/14.

## 2014-10-20 ENCOUNTER — Other Ambulatory Visit (HOSPITAL_COMMUNITY): Payer: Self-pay | Admitting: Psychiatry

## 2014-10-20 DIAGNOSIS — F988 Other specified behavioral and emotional disorders with onset usually occurring in childhood and adolescence: Secondary | ICD-10-CM

## 2014-10-20 MED ORDER — AMPHETAMINE-DEXTROAMPHETAMINE 30 MG PO TABS: 30.0000 mg | ORAL_TABLET | Freq: Two times a day (BID) | ORAL | Status: DC

## 2014-10-20 NOTE — Telephone Encounter (Signed)
I printed it again today. Signed and left up front with Nettie ElmSylvia.

## 2014-10-21 ENCOUNTER — Other Ambulatory Visit: Payer: Self-pay

## 2014-10-21 DIAGNOSIS — Z1231 Encounter for screening mammogram for malignant neoplasm of breast: Secondary | ICD-10-CM

## 2014-10-22 LAB — CBC
HCT: 36.8 % (ref 36.0–46.0)
Hemoglobin: 11.7 g/dL — ABNORMAL LOW (ref 12.0–15.0)
MCH: 26.9 pg (ref 26.0–34.0)
MCHC: 31.8 g/dL (ref 30.0–36.0)
MCV: 84.6 fL (ref 78.0–100.0)
MPV: 8.8 fL (ref 8.6–12.4)
Platelets: 429 10*3/uL — ABNORMAL HIGH (ref 150–400)
RBC: 4.35 MIL/uL (ref 3.87–5.11)
RDW: 14 % (ref 11.5–15.5)
WBC: 12.8 10*3/uL — ABNORMAL HIGH (ref 4.0–10.5)

## 2014-10-22 LAB — COMPLETE METABOLIC PANEL WITH GFR
ALT: 14 U/L (ref 0–35)
AST: 16 U/L (ref 0–37)
Albumin: 4.4 g/dL (ref 3.5–5.2)
Alkaline Phosphatase: 104 U/L (ref 39–117)
BUN: 10 mg/dL (ref 6–23)
CO2: 28 mEq/L (ref 19–32)
Calcium: 10.2 mg/dL (ref 8.4–10.5)
Chloride: 99 mEq/L (ref 96–112)
Creat: 0.93 mg/dL (ref 0.50–1.10)
GFR, Est African American: 89 mL/min
GFR, Est Non African American: 77 mL/min
Glucose, Bld: 86 mg/dL (ref 70–99)
Potassium: 4 mEq/L (ref 3.5–5.3)
Sodium: 137 mEq/L (ref 135–145)
Total Bilirubin: 0.4 mg/dL (ref 0.2–1.2)
Total Protein: 7 g/dL (ref 6.0–8.3)

## 2014-10-22 LAB — PROLACTIN: Prolactin: 11 ng/mL

## 2014-10-22 LAB — TSH: TSH: 2.207 u[IU]/mL (ref 0.350–4.500)

## 2014-10-22 LAB — LIPID PANEL
Cholesterol: 240 mg/dL — ABNORMAL HIGH (ref 0–200)
HDL: 51 mg/dL (ref >39)
LDL Cholesterol: 161 mg/dL — ABNORMAL HIGH (ref 0–99)
Total CHOL/HDL Ratio: 4.7 Ratio
Triglycerides: 139 mg/dL (ref <150)
VLDL: 28 mg/dL (ref 0–40)

## 2014-10-22 LAB — HEMOGLOBIN A1C
Hgb A1c MFr Bld: 5.8 % — ABNORMAL HIGH (ref <5.7)
Mean Plasma Glucose: 120 mg/dL — ABNORMAL HIGH (ref <117)

## 2014-10-27 ENCOUNTER — Ambulatory Visit
Admission: RE | Admit: 2014-10-27 | Discharge: 2014-10-27 | Disposition: A | Discharge status: Home / Self Care | Payer: Medicaid Other | Source: Ambulatory Visit

## 2014-10-27 DIAGNOSIS — Z1231 Encounter for screening mammogram for malignant neoplasm of breast: Secondary | ICD-10-CM

## 2014-11-17 ENCOUNTER — Encounter (HOSPITAL_COMMUNITY): Payer: Self-pay | Admitting: Psychiatry

## 2014-11-17 ENCOUNTER — Ambulatory Visit (INDEPENDENT_AMBULATORY_CARE_PROVIDER_SITE_OTHER): Payer: Medicaid Other | Admitting: Psychiatry

## 2014-11-17 VITALS — BP 152/89 | HR 90 | Ht 65.0 in | Wt 198.0 lb

## 2014-11-17 DIAGNOSIS — F909 Attention-deficit hyperactivity disorder, unspecified type: Secondary | ICD-10-CM

## 2014-11-17 DIAGNOSIS — F988 Other specified behavioral and emotional disorders with onset usually occurring in childhood and adolescence: Secondary | ICD-10-CM

## 2014-11-17 DIAGNOSIS — Z0000 Encounter for general adult medical examination without abnormal findings: Secondary | ICD-10-CM

## 2014-11-17 DIAGNOSIS — F411 Generalized anxiety disorder: Secondary | ICD-10-CM

## 2014-11-17 DIAGNOSIS — F319 Bipolar disorder, unspecified: Secondary | ICD-10-CM

## 2014-11-17 MED ORDER — LURASIDONE HCL 40 MG PO TABS: 80.0000 mg | ORAL_TABLET | Freq: Every day | ORAL | Status: DC

## 2014-11-17 MED ORDER — CLONAZEPAM 0.5 MG PO TABS: 0.5000 mg | ORAL_TABLET | Freq: Two times a day (BID) | ORAL | Status: DC

## 2014-11-17 MED ORDER — AMPHETAMINE-DEXTROAMPHETAMINE 30 MG PO TABS: 30.0000 mg | ORAL_TABLET | Freq: Two times a day (BID) | ORAL | Status: DC

## 2014-11-17 MED ORDER — AMITRIPTYLINE HCL 150 MG PO TABS: 150.0000 mg | ORAL_TABLET | Freq: Every day | ORAL | Status: DC

## 2014-11-17 MED ORDER — LAMOTRIGINE 200 MG PO TABS
200.0000 mg | ORAL_TABLET | Freq: Every day | ORAL | Status: DC
Start: 2014-11-17 — End: 2015-01-19
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
## 2014-11-20 ENCOUNTER — Ambulatory Visit (HOSPITAL_COMMUNITY)
Admission: RE | Admit: 2014-11-20 | Discharge: 2014-11-20 | Disposition: A | Discharge status: Home / Self Care | Payer: Medicaid Other | Source: Ambulatory Visit | Attending: Psychiatry | Admitting: Psychiatry

## 2014-11-20 DIAGNOSIS — Z0000 Encounter for general adult medical examination without abnormal findings: Secondary | ICD-10-CM | POA: Insufficient documentation

## 2014-12-10 ENCOUNTER — Telehealth (HOSPITAL_COMMUNITY): Payer: Self-pay | Admitting: *Deleted

## 2014-12-10 DIAGNOSIS — F988 Other specified behavioral and emotional disorders with onset usually occurring in childhood and adolescence: Secondary | ICD-10-CM

## 2014-12-10 MED ORDER — AMPHETAMINE-DEXTROAMPHETAMINE 30 MG PO TABS: 30.0000 mg | ORAL_TABLET | Freq: Two times a day (BID) | ORAL | Status: DC

## 2014-12-10 NOTE — Telephone Encounter (Signed)
We can print it out with note to pharmacist to fill on 12/18/2014

## 2014-12-10 NOTE — Telephone Encounter (Signed)
Dr. Michae KavaAgarwal,   Patient called giving you a heads up on her medication refill.  Patient stated on December 18, 2014 she will need a refill on Adderall.

## 2014-12-10 NOTE — Addendum Note (Signed)
Addended by: Ok AnisSMITH, Hazeline Charnley A on: 12/10/2014 04:34 PM   Modules accepted: Orders

## 2014-12-10 NOTE — Telephone Encounter (Signed)
Patient notified RX ready

## 2014-12-15 ENCOUNTER — Telehealth (HOSPITAL_COMMUNITY): Payer: Self-pay

## 2014-12-15 NOTE — Telephone Encounter (Signed)
12/15/14 10L:55am Patient's father Marylin CrosbyDonald Callaway  HQ#[id number] pick-up rx script.[name].Marguerite Olea/sh

## 2014-12-29 NOTE — Telephone Encounter (Signed)
[facility] showing that pt UDS is positive for 2 benzos- Klonopin and Ativan. It was collected on 11/24/2014 and verified on 12/02/2014. Will scan copy into chart.

## 2015-01-19 ENCOUNTER — Encounter (HOSPITAL_COMMUNITY): Payer: Self-pay | Admitting: Psychiatry

## 2015-01-19 ENCOUNTER — Ambulatory Visit (INDEPENDENT_AMBULATORY_CARE_PROVIDER_SITE_OTHER): Payer: Medicaid Other | Admitting: Psychiatry

## 2015-01-19 VITALS — BP 138/81 | HR 100 | Ht 65.0 in | Wt 218.4 lb

## 2015-01-19 DIAGNOSIS — F411 Generalized anxiety disorder: Secondary | ICD-10-CM | POA: Diagnosis not present

## 2015-01-19 DIAGNOSIS — F319 Bipolar disorder, unspecified: Secondary | ICD-10-CM | POA: Diagnosis not present

## 2015-01-19 DIAGNOSIS — F909 Attention-deficit hyperactivity disorder, unspecified type: Secondary | ICD-10-CM

## 2015-01-19 DIAGNOSIS — F988 Other specified behavioral and emotional disorders with onset usually occurring in childhood and adolescence: Secondary | ICD-10-CM

## 2015-01-19 MED ORDER — AMPHETAMINE-DEXTROAMPHETAMINE 30 MG PO TABS: 30.0000 mg | ORAL_TABLET | Freq: Two times a day (BID) | ORAL | Status: DC

## 2015-01-19 MED ORDER — LAMOTRIGINE 200 MG PO TABS
200.0000 mg | ORAL_TABLET | Freq: Every day | ORAL | Status: DC
Start: 2015-01-19 — End: 2015-07-22

## 2015-01-19 MED ORDER — CLONAZEPAM 0.5 MG PO TABS: 0.5000 mg | ORAL_TABLET | Freq: Two times a day (BID) | ORAL | Status: DC

## 2015-01-19 MED ORDER — LURASIDONE HCL 40 MG PO TABS: 80.0000 mg | ORAL_TABLET | Freq: Every day | ORAL | Status: DC

## 2015-01-19 MED ORDER — AMITRIPTYLINE HCL 150 MG PO TABS: 150.0000 mg | ORAL_TABLET | Freq: Every day | ORAL | Status: DC
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
## 2015-03-03 ENCOUNTER — Encounter (HOSPITAL_COMMUNITY): Payer: Self-pay | Admitting: Emergency Medicine

## 2015-03-03 DIAGNOSIS — L02219 Cutaneous abscess of trunk, unspecified: Secondary | ICD-10-CM | POA: Diagnosis not present

## 2015-03-03 DIAGNOSIS — F909 Attention-deficit hyperactivity disorder, unspecified type: Secondary | ICD-10-CM | POA: Diagnosis not present

## 2015-03-03 DIAGNOSIS — Z720 Tobacco use: Secondary | ICD-10-CM | POA: Insufficient documentation

## 2015-03-03 DIAGNOSIS — N61 Inflammatory disorders of breast: Secondary | ICD-10-CM | POA: Insufficient documentation

## 2015-03-03 DIAGNOSIS — G8929 Other chronic pain: Secondary | ICD-10-CM | POA: Insufficient documentation

## 2015-03-03 DIAGNOSIS — J449 Chronic obstructive pulmonary disease, unspecified: Secondary | ICD-10-CM | POA: Diagnosis not present

## 2015-03-03 DIAGNOSIS — L739 Follicular disorder, unspecified: Secondary | ICD-10-CM | POA: Diagnosis not present

## 2015-03-03 DIAGNOSIS — G43909 Migraine, unspecified, not intractable, without status migrainosus: Secondary | ICD-10-CM | POA: Diagnosis present

## 2015-03-03 DIAGNOSIS — F419 Anxiety disorder, unspecified: Secondary | ICD-10-CM | POA: Diagnosis not present

## 2015-03-03 DIAGNOSIS — F319 Bipolar disorder, unspecified: Secondary | ICD-10-CM | POA: Insufficient documentation

## 2015-03-03 DIAGNOSIS — L03319 Cellulitis of trunk, unspecified: Secondary | ICD-10-CM | POA: Insufficient documentation

## 2015-03-03 DIAGNOSIS — Z79899 Other long term (current) drug therapy: Secondary | ICD-10-CM | POA: Insufficient documentation

## 2015-03-03 NOTE — ED Notes (Signed)
Pt. reports migraine headache this evening unrelieved by OTC Ibuprofen , no nausea or vomitting , pt. added multiple small abscesses at arms and torso with drainage onset this week .

## 2015-03-04 ENCOUNTER — Emergency Department (HOSPITAL_COMMUNITY)
Admission: EM | Admit: 2015-03-04 | Discharge: 2015-03-04 | Disposition: A | Discharge status: Home / Self Care | Payer: Medicaid Other | Attending: Emergency Medicine | Admitting: Emergency Medicine

## 2015-03-04 DIAGNOSIS — L0291 Cutaneous abscess, unspecified: Secondary | ICD-10-CM

## 2015-03-04 DIAGNOSIS — G43009 Migraine without aura, not intractable, without status migrainosus: Secondary | ICD-10-CM

## 2015-03-04 DIAGNOSIS — L0390 Cellulitis, unspecified: Secondary | ICD-10-CM

## 2015-03-04 DIAGNOSIS — L739 Follicular disorder, unspecified: Secondary | ICD-10-CM

## 2015-03-04 MED ORDER — KETOROLAC TROMETHAMINE 30 MG/ML IJ SOLN
60.0000 mg | Freq: Once | INTRAMUSCULAR | Status: AC
  Administered 2015-03-04: 60 mg via INTRAMUSCULAR
  Filled 2015-03-04: qty 2

## 2015-03-04 MED ORDER — SULFAMETHOXAZOLE-TRIMETHOPRIM 800-160 MG PO TABS: 1.0000 | ORAL_TABLET | Freq: Two times a day (BID) | ORAL | Status: AC

## 2015-03-04 MED ORDER — SULFAMETHOXAZOLE-TRIMETHOPRIM 800-160 MG PO TABS
1.0000 | ORAL_TABLET | Freq: Once | ORAL | Status: AC
  Administered 2015-03-04: 1 via ORAL
  Filled 2015-03-04: qty 1

## 2015-03-04 MED ORDER — PROMETHAZINE HCL 25 MG/ML IJ SOLN
12.5000 mg | INTRAMUSCULAR | Status: DC | PRN
  Administered 2015-03-04: 12.5 mg via INTRAMUSCULAR
  Filled 2015-03-04: qty 1

## 2015-03-04 NOTE — ED Notes (Signed)
Ward, MD at bedside. 
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
## 2015-03-04 NOTE — Discharge Instructions (Signed)
Abscess °An abscess is an infected area that contains a collection of pus and debris. It can occur in almost any part of the body. An abscess is also known as a furuncle or boil. °CAUSES  °An abscess occurs when tissue gets infected. This can occur from blockage of oil or sweat glands, infection of hair follicles, or a minor injury to the skin. As the body tries to fight the infection, pus collects in the area and creates pressure under the skin. This pressure causes pain. People with weakened immune systems have difficulty fighting infections and get certain abscesses more often.  °SYMPTOMS °Usually an abscess develops on the skin and becomes a painful mass that is red, warm, and tender. If the abscess forms under the skin, you may feel a moveable soft area under the skin. Some abscesses break open (rupture) on their own, but most will continue to get worse without care. The infection can spread deeper into the body and eventually into the bloodstream, causing you to feel ill.  °DIAGNOSIS  °Your caregiver will take your medical history and perform a physical exam. A sample of fluid may also be taken from the abscess to determine what is causing your infection. °TREATMENT  °Your caregiver may prescribe antibiotic medicines to fight the infection. However, taking antibiotics alone usually does not cure an abscess. Your caregiver may need to make a small cut (incision) in the abscess to drain the pus. In some cases, gauze is packed into the abscess to reduce pain and to continue draining the area. °HOME CARE INSTRUCTIONS  °· Only take over-the-counter or prescription medicines for pain, discomfort, or fever as directed by your caregiver. °· If you were prescribed antibiotics, take them as directed. Finish them even if you start to feel better. °· If gauze is used, follow your caregiver's directions for changing the gauze. °· To avoid spreading the infection: °· Keep your draining abscess covered with a  bandage. °· Wash your hands well. °· Do not share personal care items, towels, or whirlpools with others. °· Avoid skin contact with others. °· Keep your skin and clothes clean around the abscess. °· Keep all follow-up appointments as directed by your caregiver. °SEEK MEDICAL CARE IF:  °· You have increased pain, swelling, redness, fluid drainage, or bleeding. °· You have muscle aches, chills, or a general ill feeling. °· You have a fever. °MAKE SURE YOU:  °· Understand these instructions. °· Will watch your condition. °· Will get help right away if you are not doing well or get worse. °Document Released: 06/14/2005 Document Revised: 03/05/2012 Document Reviewed: 11/17/2011 °[employer]® Patient Information ©2015 [employer]. This information is not intended to replace advice given to you by your health care provider. Make sure you discuss any questions you have with your health care provider. ° °Cellulitis °Cellulitis is an infection of the skin and the tissue beneath it. The infected area is usually red and tender. Cellulitis occurs most often in the arms and lower legs.  °CAUSES  °Cellulitis is caused by bacteria that enter the skin through cracks or cuts in the skin. The most common types of bacteria that cause cellulitis are staphylococci and streptococci. °SIGNS AND SYMPTOMS  °· Redness and warmth. °· Swelling. °· Tenderness or pain. °· Fever. °DIAGNOSIS  °Your health care provider can usually determine what is wrong based on a physical exam. Blood tests may also be done. °TREATMENT  °Treatment usually involves taking an antibiotic medicine. °HOME CARE INSTRUCTIONS  °· Take your antibiotic   medicine as directed by your health care provider. Finish the antibiotic even if you start to feel better.  Keep the infected arm or leg elevated to reduce swelling.  Apply a warm cloth to the affected area up to 4 times per day to relieve pain.  Take medicines only as directed by your health care provider.  Keep all  follow-up visits as directed by your health care provider. SEEK MEDICAL CARE IF:   You notice red streaks coming from the infected area.  Your red area gets larger or turns dark in color.  Your bone or joint underneath the infected area becomes painful after the skin has healed.  Your infection returns in the same area or another area.  You notice a swollen bump in the infected area.  You develop new symptoms.  You have a fever. SEEK IMMEDIATE MEDICAL CARE IF:   You feel very sleepy.  You develop vomiting or diarrhea.  You have a general ill feeling (malaise) with muscle aches and pains. MAKE SURE YOU:   Understand these instructions.  Will watch your condition.  Will get help right away if you are not doing well or get worse. Document Released: 06/14/2005 Document Revised: 01/19/2014 Document Reviewed: 11/20/2011 [facility] Patient Information 2015 [place], [place]. This information is not intended to replace advice given to you by your health care provider. Make sure you discuss any questions you have with your health care provider.  Folliculitis  Folliculitis is redness, soreness, and swelling (inflammation) of the hair follicles. This condition can occur anywhere on the body. People with weakened immune systems, diabetes, or obesity have a greater risk of getting folliculitis. CAUSES  Bacterial infection. This is the most common cause.  Fungal infection.  Viral infection.  Contact with certain chemicals, especially oils and tars. Long-term folliculitis can result from bacteria that live in the nostrils. The bacteria may trigger multiple outbreaks of folliculitis over time. SYMPTOMS Folliculitis most commonly occurs on the scalp, thighs, legs, back, buttocks, and areas where hair is shaved frequently. An early sign of folliculitis is a small, white or yellow, pus-filled, itchy lesion (pustule). These lesions appear on a red, inflamed follicle. They are usually less than  0.2 inches (5 mm) wide. When there is an infection of the follicle that goes deeper, it becomes a boil or furuncle. A group of closely packed boils creates a larger lesion (carbuncle). Carbuncles tend to occur in hairy, sweaty areas of the body. DIAGNOSIS  Your caregiver can usually tell what is wrong by doing a physical exam. A sample may be taken from one of the lesions and tested in a lab. This can help determine what is causing your folliculitis. TREATMENT  Treatment may include:  Applying warm compresses to the affected areas.  Taking antibiotic medicines orally or applying them to the skin.  Draining the lesions if they contain a large amount of pus or fluid.  Laser hair removal for cases of long-lasting folliculitis. This helps to prevent regrowth of the hair. HOME CARE INSTRUCTIONS  Apply warm compresses to the affected areas as directed by your caregiver.  If antibiotics are prescribed, take them as directed. Finish them even if you start to feel better.  You may take over-the-counter medicines to relieve itching.  Do not shave irritated skin.  Follow up with your caregiver as directed. SEEK IMMEDIATE MEDICAL CARE IF:   You have increasing redness, swelling, or pain in the affected area.  You have a fever. MAKE SURE YOU:  Understand  these instructions.  Will watch your condition.  Will get help right away if you are not doing well or get worse. Document Released: 11/13/2001 Document Revised: 03/05/2012 Document Reviewed: 12/05/2011 [facility] Patient Information 2015 [place], [place]. This information is not intended to replace advice given to you by your health care provider. Make sure you discuss any questions you have with your health care provider.  Migraine Headache A migraine headache is an intense, throbbing pain on one or both sides of your head. A migraine can last for 30 minutes to several hours. CAUSES  The exact cause of a migraine headache is not always  known. However, a migraine may be caused when nerves in the brain become irritated and release chemicals that cause inflammation. This causes pain. Certain things may also trigger migraines, such as:  Alcohol.  Smoking.  Stress.  Menstruation.  Aged cheeses.  Foods or drinks that contain nitrates, glutamate, aspartame, or tyramine.  Lack of sleep.  Chocolate.  Caffeine.  Hunger.  Physical exertion.  Fatigue.  Medicines used to treat chest pain (nitroglycerine), birth control pills, estrogen, and some blood pressure medicines. SIGNS AND SYMPTOMS  Pain on one or both sides of your head.  Pulsating or throbbing pain.  Severe pain that prevents daily activities.  Pain that is aggravated by any physical activity.  Nausea, vomiting, or both.  Dizziness.  Pain with exposure to bright lights, loud noises, or activity.  General sensitivity to bright lights, loud noises, or smells. Before you get a migraine, you may get warning signs that a migraine is coming (aura). An aura may include:  Seeing flashing lights.  Seeing bright spots, halos, or zigzag lines.  Having tunnel vision or blurred vision.  Having feelings of numbness or tingling.  Having trouble talking.  Having muscle weakness. DIAGNOSIS  A migraine headache is often diagnosed based on:  Symptoms.  Physical exam.  A CT scan or MRI of your head. These imaging tests cannot diagnose migraines, but they can help rule out other causes of headaches. TREATMENT Medicines may be given for pain and nausea. Medicines can also be given to help prevent recurrent migraines.  HOME CARE INSTRUCTIONS  Only take over-the-counter or prescription medicines for pain or discomfort as directed by your health care provider. The use of long-term narcotics is not recommended.  Lie down in a dark, quiet room when you have a migraine.  Keep a journal to find out what may trigger your migraine headaches. For example,  write down:  What you eat and drink.  How much sleep you get.  Any change to your diet or medicines.  Limit alcohol consumption.  Quit smoking if you smoke.  Get 7-9 hours of sleep, or as recommended by your health care provider.  Limit stress.  Keep lights dim if bright lights bother you and make your migraines worse. SEEK IMMEDIATE MEDICAL CARE IF:   Your migraine becomes severe.  You have a fever.  You have a stiff neck.  You have vision loss.  You have muscular weakness or loss of muscle control.  You start losing your balance or have trouble walking.  You feel faint or pass out.  You have severe symptoms that are different from your first symptoms. MAKE SURE YOU:   Understand these instructions.  Will watch your condition.  Will get help right away if you are not doing well or get worse. Document Released: 09/04/2005 Document Revised: 01/19/2014 Document Reviewed: 05/12/2013 [facility] Patient Information 2015 [place], [place]. This  information is not intended to replace advice given to you by your health care provider. Make sure you discuss any questions you have with your health care provider. ° °

## 2015-03-14 ENCOUNTER — Encounter (HOSPITAL_COMMUNITY): Payer: Self-pay | Admitting: Emergency Medicine

## 2015-03-14 ENCOUNTER — Emergency Department (HOSPITAL_COMMUNITY)
Admission: EM | Admit: 2015-03-14 | Discharge: 2015-03-15 | Disposition: A | Discharge status: Home / Self Care | Payer: Medicaid Other | Attending: Emergency Medicine | Admitting: Emergency Medicine

## 2015-03-14 DIAGNOSIS — Z79899 Other long term (current) drug therapy: Secondary | ICD-10-CM | POA: Diagnosis not present

## 2015-03-14 DIAGNOSIS — F319 Bipolar disorder, unspecified: Secondary | ICD-10-CM | POA: Diagnosis not present

## 2015-03-14 DIAGNOSIS — F42 Obsessive-compulsive disorder: Secondary | ICD-10-CM | POA: Insufficient documentation

## 2015-03-14 DIAGNOSIS — R21 Rash and other nonspecific skin eruption: Secondary | ICD-10-CM

## 2015-03-14 DIAGNOSIS — L989 Disorder of the skin and subcutaneous tissue, unspecified: Secondary | ICD-10-CM | POA: Insufficient documentation

## 2015-03-14 DIAGNOSIS — F419 Anxiety disorder, unspecified: Secondary | ICD-10-CM | POA: Insufficient documentation

## 2015-03-14 DIAGNOSIS — R51 Headache: Secondary | ICD-10-CM | POA: Insufficient documentation

## 2015-03-14 DIAGNOSIS — F909 Attention-deficit hyperactivity disorder, unspecified type: Secondary | ICD-10-CM | POA: Diagnosis not present

## 2015-03-14 DIAGNOSIS — Z720 Tobacco use: Secondary | ICD-10-CM | POA: Insufficient documentation

## 2015-03-14 DIAGNOSIS — J449 Chronic obstructive pulmonary disease, unspecified: Secondary | ICD-10-CM | POA: Insufficient documentation

## 2015-03-14 LAB — CBC WITH DIFFERENTIAL/PLATELET
Basophils Absolute: 0 10*3/uL (ref 0.0–0.1)
Basophils Relative: 0 % (ref 0–1)
Eosinophils Absolute: 0.3 10*3/uL (ref 0.0–0.7)
Eosinophils Relative: 2 % (ref 0–5)
HCT: 33.5 % — ABNORMAL LOW (ref 36.0–46.0)
Hemoglobin: 10.4 g/dL — ABNORMAL LOW (ref 12.0–15.0)
Lymphocytes Relative: 27 % (ref 12–46)
Lymphs Abs: 3.5 10*3/uL (ref 0.7–4.0)
MCH: 26.9 pg (ref 26.0–34.0)
MCHC: 31 g/dL (ref 30.0–36.0)
MCV: 86.8 fL (ref 78.0–100.0)
Monocytes Absolute: 0.6 10*3/uL (ref 0.1–1.0)
Monocytes Relative: 5 % (ref 3–12)
Neutro Abs: 8.2 10*3/uL — ABNORMAL HIGH (ref 1.7–7.7)
Neutrophils Relative %: 66 % (ref 43–77)
Platelets: 443 10*3/uL — ABNORMAL HIGH (ref 150–400)
RBC: 3.86 MIL/uL — ABNORMAL LOW (ref 3.87–5.11)
RDW: 14.8 % (ref 11.5–15.5)
WBC: 12.6 10*3/uL — ABNORMAL HIGH (ref 4.0–10.5)

## 2015-03-14 LAB — BASIC METABOLIC PANEL
Anion gap: 9 (ref 5–15)
BUN: 8 mg/dL (ref 6–20)
CO2: 30 mmol/L (ref 22–32)
Calcium: 8.8 mg/dL — ABNORMAL LOW (ref 8.9–10.3)
Chloride: 97 mmol/L — ABNORMAL LOW (ref 101–111)
Creatinine, Ser: 0.65 mg/dL (ref 0.44–1.00)
GFR calc Af Amer: >60 mL/min (ref >60)
GFR calc non Af Amer: >60 mL/min (ref >60)
Glucose, Bld: 109 mg/dL — ABNORMAL HIGH (ref 65–99)
Potassium: 3.7 mmol/L (ref 3.5–5.1)
Sodium: 136 mmol/L (ref 135–145)

## 2015-03-14 MED ORDER — CLOTRIMAZOLE 1 % EX CREA: TOPICAL_CREAM | CUTANEOUS | Status: DC

## 2015-03-14 MED ORDER — SULFAMETHOXAZOLE-TRIMETHOPRIM 800-160 MG PO TABS: 1.0000 | ORAL_TABLET | Freq: Two times a day (BID) | ORAL | Status: AC

## 2015-03-14 MED ORDER — FLUCONAZOLE 200 MG PO TABS: ORAL_TABLET | ORAL | Status: DC
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
## 2015-03-14 NOTE — Discharge Instructions (Signed)

## 2015-03-14 NOTE — ED Notes (Signed)
C/o headache x 2 days.  Also c/o rash under bilateral breast, abd, and vaginal area x 1 month.

## 2015-04-19 ENCOUNTER — Telehealth (HOSPITAL_COMMUNITY): Payer: Self-pay

## 2015-04-19 ENCOUNTER — Telehealth (HOSPITAL_COMMUNITY): Payer: Self-pay | Admitting: *Deleted

## 2015-04-19 MED ORDER — AMPHETAMINE-DEXTROAMPHETAMINE 30 MG PO TABS: 30.0000 mg | ORAL_TABLET | Freq: Two times a day (BID) | ORAL | Status: DC

## 2015-04-19 NOTE — Telephone Encounter (Signed)
PT called for a refill for Adderall .  Please call pt once prescription is written at [phone].

## 2015-04-19 NOTE — Telephone Encounter (Signed)
04/19/15 4:27pm Patient IO#[id number] came and pick-up rx script.Nancy KitchenMarguerite Bowers

## 2015-04-19 NOTE — Telephone Encounter (Signed)
Prescription done

## 2015-04-27 ENCOUNTER — Telehealth (HOSPITAL_COMMUNITY): Payer: Self-pay

## 2015-04-27 NOTE — Telephone Encounter (Signed)
Medication management - Brandi from [facility] called to report they had been doing drug screens on patient for the past 3 months and she has not tested positive for Clonazepam (Benzodiazepines) in the past 3 months. Agreed to inform patient's provider to question if patient still in need of this medication as does not appear it is being taken.  Attempted to contact patient by phone to question but her phone would not accept messages to be left and there was no answer.

## 2015-04-27 NOTE — Telephone Encounter (Signed)
She takes it only as a when necessary

## 2015-05-17 ENCOUNTER — Telehealth (HOSPITAL_COMMUNITY): Payer: Self-pay

## 2015-05-17 NOTE — Telephone Encounter (Signed)
Medication refill request for Adderall - Pt last seen by Dr Michae Kava on 01/19/15 and was given 3 prescriptions for Adderal at that time with plan to return in 6 months, schedueld for 07/22/15.  Dr. Rutherford Limerick wrote 1 time order 04/19/15.  Pt. requesting refill of Adderall and would like prescriptions until returns to see Dr. Beather Arbour 01/19/15.  Patient states she is doing well currently with no complaints.

## 2015-05-18 MED ORDER — AMPHETAMINE-DEXTROAMPHETAMINE 30 MG PO TABS: 30.0000 mg | ORAL_TABLET | Freq: Two times a day (BID) | ORAL | Status: DC

## 2015-05-18 NOTE — Telephone Encounter (Signed)
1 prescription given today 05/18/15

## 2015-05-18 NOTE — Telephone Encounter (Signed)
Medication refill request - Called patient to inform her requested Adderall prescription was prepared for pick up.

## 2015-06-16 ENCOUNTER — Telehealth (HOSPITAL_COMMUNITY): Payer: Self-pay

## 2015-06-16 DIAGNOSIS — F988 Other specified behavioral and emotional disorders with onset usually occurring in childhood and adolescence: Secondary | ICD-10-CM

## 2015-06-16 NOTE — Telephone Encounter (Signed)
Medication management - Left patient a message this nurse had gotten her message requesting a refill of her Adderall.  Informed patient on message Dr. Michae Kava would be back in the office on 06/17/15 and would send request to her.   Patient last evaluated on 01/19/15 and is set to return on 07/22/15.  Last Adderall order provided on 05/18/15 by Dr. Rutherford Limerick.

## 2015-06-17 MED ORDER — AMPHETAMINE-DEXTROAMPHETAMINE 30 MG PO TABS: 30.0000 mg | ORAL_TABLET | Freq: Two times a day (BID) | ORAL | Status: DC

## 2015-06-17 NOTE — Telephone Encounter (Signed)
Yes we can refill 

## 2015-06-17 NOTE — Telephone Encounter (Signed)
Medication refill request - Telephone call with patient to inform Dr. Michae Kava approved a refill of her Adderall and patient stated she would pick up 06/18/15.  New order prepared, reviewed and signed by Dr. Michae Kava and left at front for patient to pick up

## 2015-06-18 ENCOUNTER — Telehealth (HOSPITAL_COMMUNITY): Payer: Self-pay

## 2015-06-18 NOTE — Telephone Encounter (Signed)
06/18/15 10:53am Pt came and pick-up rx scrip DL [phone].Nancy KitchenMarguerite Bowers

## 2015-07-22 ENCOUNTER — Ambulatory Visit (INDEPENDENT_AMBULATORY_CARE_PROVIDER_SITE_OTHER): Payer: Medicaid Other | Admitting: Psychiatry

## 2015-07-22 ENCOUNTER — Encounter (HOSPITAL_COMMUNITY): Payer: Self-pay | Admitting: Psychiatry

## 2015-07-22 VITALS — BP 132/78 | HR 86 | Ht 65.0 in | Wt 198.4 lb

## 2015-07-22 DIAGNOSIS — F411 Generalized anxiety disorder: Secondary | ICD-10-CM

## 2015-07-22 DIAGNOSIS — F319 Bipolar disorder, unspecified: Secondary | ICD-10-CM | POA: Diagnosis not present

## 2015-07-22 DIAGNOSIS — F988 Other specified behavioral and emotional disorders with onset usually occurring in childhood and adolescence: Secondary | ICD-10-CM

## 2015-07-22 DIAGNOSIS — F900 Attention-deficit hyperactivity disorder, predominantly inattentive type: Secondary | ICD-10-CM

## 2015-07-22 MED ORDER — AMPHETAMINE-DEXTROAMPHETAMINE 30 MG PO TABS: 30.0000 mg | ORAL_TABLET | Freq: Two times a day (BID) | ORAL | Status: DC

## 2015-07-22 MED ORDER — LURASIDONE HCL 40 MG PO TABS: 80.0000 mg | ORAL_TABLET | Freq: Every day | ORAL | Status: DC

## 2015-07-22 MED ORDER — LAMOTRIGINE 200 MG PO TABS: 200.0000 mg | ORAL_TABLET | Freq: Every day | ORAL | Status: DC

## 2015-07-22 MED ORDER — CLONAZEPAM 0.5 MG PO TABS: 0.5000 mg | ORAL_TABLET | Freq: Two times a day (BID) | ORAL | Status: DC

## 2015-07-22 MED ORDER — AMITRIPTYLINE HCL 150 MG PO TABS: 150.0000 mg | ORAL_TABLET | Freq: Every day | ORAL | Status: DC
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
## 2015-08-01 ENCOUNTER — Encounter (HOSPITAL_COMMUNITY): Payer: Self-pay

## 2015-08-01 ENCOUNTER — Emergency Department (HOSPITAL_COMMUNITY)
Admission: EM | Admit: 2015-08-01 | Discharge: 2015-08-01 | Disposition: A | Discharge status: Home / Self Care | Payer: Medicaid Other | Attending: Emergency Medicine | Admitting: Emergency Medicine

## 2015-08-01 DIAGNOSIS — G43909 Migraine, unspecified, not intractable, without status migrainosus: Secondary | ICD-10-CM | POA: Insufficient documentation

## 2015-08-01 DIAGNOSIS — J449 Chronic obstructive pulmonary disease, unspecified: Secondary | ICD-10-CM | POA: Insufficient documentation

## 2015-08-01 DIAGNOSIS — L03031 Cellulitis of right toe: Secondary | ICD-10-CM | POA: Diagnosis not present

## 2015-08-01 DIAGNOSIS — F319 Bipolar disorder, unspecified: Secondary | ICD-10-CM | POA: Diagnosis not present

## 2015-08-01 DIAGNOSIS — F419 Anxiety disorder, unspecified: Secondary | ICD-10-CM | POA: Diagnosis not present

## 2015-08-01 DIAGNOSIS — Z79899 Other long term (current) drug therapy: Secondary | ICD-10-CM | POA: Insufficient documentation

## 2015-08-01 DIAGNOSIS — M79674 Pain in right toe(s): Secondary | ICD-10-CM | POA: Diagnosis present

## 2015-08-01 DIAGNOSIS — F909 Attention-deficit hyperactivity disorder, unspecified type: Secondary | ICD-10-CM | POA: Diagnosis not present

## 2015-08-01 DIAGNOSIS — F17210 Nicotine dependence, cigarettes, uncomplicated: Secondary | ICD-10-CM | POA: Diagnosis not present

## 2015-08-01 HISTORY — DX: Unspecified asthma, uncomplicated: J45.909

## 2015-08-01 MED ORDER — SULFAMETHOXAZOLE-TRIMETHOPRIM 800-160 MG PO TABS: 1.0000 | ORAL_TABLET | Freq: Two times a day (BID) | ORAL | Status: AC

## 2015-08-01 MED ORDER — OXYCODONE HCL 5 MG PO TABS
5.0000 mg | ORAL_TABLET | Freq: Once | ORAL | Status: AC
  Administered 2015-08-01: 5 mg via ORAL
  Filled 2015-08-01: qty 1

## 2015-08-01 MED ORDER — OXYCODONE HCL 5 MG PO TABS: 5.0000 mg | ORAL_TABLET | Freq: Four times a day (QID) | ORAL | Status: DC | PRN

## 2015-08-01 MED ORDER — CEPHALEXIN 500 MG PO CAPS: 1000.0000 mg | ORAL_CAPSULE | Freq: Two times a day (BID) | ORAL | Status: DC

## 2015-08-01 MED ORDER — LIDOCAINE HCL 2 % IJ SOLN
20.0000 mL | Freq: Once | INTRAMUSCULAR | Status: AC
  Administered 2015-08-01: 400 mg via INTRADERMAL
  Filled 2015-08-01: qty 20

## 2015-08-01 NOTE — Discharge Instructions (Signed)
Paronychia °Paronychia is an infection of the skin that surrounds a nail. It usually affects the skin around a fingernail, but it may also occur near a toenail. It often causes pain and swelling around the nail. This condition may come on suddenly or develop over a longer period. In some cases, a collection of pus (abscess) can form near or under the nail. Usually, paronychia is not serious and it clears up with treatment. °CAUSES °This condition may be caused by bacteria or fungi. It is commonly caused by either Streptococcus or Staphylococcus bacteria. The bacteria or fungi often cause the infection by getting into the affected area through an opening in the skin, such as a cut or a hangnail. °RISK FACTORS °This condition is more likely to develop in: °· People who get their hands wet often, such as those who work as dishwashers, bartenders, or nurses. °· People who bite their fingernails or suck their thumbs. °· People who trim their nails too short. °· People who have hangnails or injured fingertips. °· People who get manicures. °· People who have diabetes. °SYMPTOMS °Symptoms of this condition include: °· Redness and swelling of the skin near the nail. °· Tenderness around the nail when you touch the area. °· Pus-filled bumps under the cuticle. The cuticle is the skin at the base or sides of the nail. °· Fluid or pus under the nail. °· Throbbing pain in the area. °DIAGNOSIS °This condition is usually diagnosed with a physical exam. In some cases, a sample of pus may be taken from an abscess to be tested in a lab. This can help to determine what type of bacteria or fungi is causing the condition. °TREATMENT °Treatment for this condition depends on the cause and severity of the condition. If the condition is mild, it may clear up on its own in a few days. Your health care provider may recommend soaking the affected area in warm water a few times a day. When treatment is needed, the options may  include: °· Antibiotic medicine, if the condition is caused by a bacterial infection. °· Antifungal medicine, if the condition is caused by a fungal infection. °· Incision and drainage, if an abscess is present. In this procedure, the health care provider will cut open the abscess so the pus can drain out. °HOME CARE INSTRUCTIONS °· Soak the affected area in warm water if directed to do so by your health care provider. You may be told to do this for 20 minutes, 2-3 times a day. Keep the area dry in between soakings. °· Take medicines only as directed by your health care provider. °· If you were prescribed an antibiotic medicine, finish all of it even if you start to feel better. °· Keep the affected area clean. °· Do not try to drain a fluid-filled bump yourself. °· If you will be washing dishes or performing other tasks that require your hands to get wet, wear rubber gloves. You should also wear gloves if your hands might come in contact with irritating substances, such as cleaners or chemicals. °· Follow your health care provider's instructions about: °¨ Wound care. °¨ Bandage (dressing) changes and removal. °SEEK MEDICAL CARE IF: °· Your symptoms get worse or do not improve with treatment. °· You have a fever or chills. °· You have redness spreading from the affected area. °· You have continued or increased fluid, blood, or pus coming from the affected area. °· Your finger or knuckle becomes swollen or is difficult to move. °  °  This information is not intended to replace advice given to you by your health care provider. Make sure you discuss any questions you have with your health care provider. °  °Document Released: 02/28/2001 Document Revised: 01/19/2015 Document Reviewed: 08/12/2014 °Elsevier Interactive Patient Education ©2016 [employer] ° °
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
## 2015-10-26 ENCOUNTER — Encounter (HOSPITAL_COMMUNITY): Payer: Self-pay | Admitting: Psychiatry

## 2015-10-26 ENCOUNTER — Ambulatory Visit (INDEPENDENT_AMBULATORY_CARE_PROVIDER_SITE_OTHER): Payer: Medicaid Other | Admitting: Psychiatry

## 2015-10-26 VITALS — BP 118/76 | HR 72 | Ht 65.0 in | Wt 173.6 lb

## 2015-10-26 DIAGNOSIS — F900 Attention-deficit hyperactivity disorder, predominantly inattentive type: Secondary | ICD-10-CM

## 2015-10-26 DIAGNOSIS — F411 Generalized anxiety disorder: Secondary | ICD-10-CM

## 2015-10-26 DIAGNOSIS — F988 Other specified behavioral and emotional disorders with onset usually occurring in childhood and adolescence: Secondary | ICD-10-CM

## 2015-10-26 DIAGNOSIS — F319 Bipolar disorder, unspecified: Secondary | ICD-10-CM

## 2015-10-26 MED ORDER — AMPHETAMINE-DEXTROAMPHETAMINE 30 MG PO TABS: 30.0000 mg | ORAL_TABLET | Freq: Two times a day (BID) | ORAL | Status: DC

## 2015-10-26 MED ORDER — CLONAZEPAM 0.5 MG PO TABS
0.5000 mg | ORAL_TABLET | Freq: Two times a day (BID) | ORAL | Status: DC
Start: 2015-10-26 — End: 2016-01-25

## 2015-10-26 MED ORDER — LURASIDONE HCL 40 MG PO TABS: 80.0000 mg | ORAL_TABLET | Freq: Every day | ORAL | Status: DC

## 2015-10-26 MED ORDER — AMITRIPTYLINE HCL 150 MG PO TABS: 150.0000 mg | ORAL_TABLET | Freq: Every day | ORAL | Status: DC

## 2015-10-26 MED ORDER — LAMOTRIGINE 200 MG PO TABS: 200.0000 mg | ORAL_TABLET | Freq: Every day | ORAL | Status: DC
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
## 2015-10-28 ENCOUNTER — Other Ambulatory Visit: Payer: Self-pay

## 2015-10-28 DIAGNOSIS — Z1231 Encounter for screening mammogram for malignant neoplasm of breast: Secondary | ICD-10-CM

## 2015-11-09 ENCOUNTER — Ambulatory Visit
Admission: RE | Admit: 2015-11-09 | Discharge: 2015-11-09 | Disposition: A | Discharge status: Home / Self Care | Payer: Medicaid Other | Source: Ambulatory Visit

## 2015-11-09 DIAGNOSIS — Z1231 Encounter for screening mammogram for malignant neoplasm of breast: Secondary | ICD-10-CM

## 2015-11-12 ENCOUNTER — Other Ambulatory Visit: Payer: Self-pay | Admitting: Internal Medicine

## 2015-11-12 DIAGNOSIS — R928 Other abnormal and inconclusive findings on diagnostic imaging of breast: Secondary | ICD-10-CM

## 2015-11-18 ENCOUNTER — Other Ambulatory Visit: Payer: Self-pay | Admitting: Internal Medicine

## 2015-11-18 ENCOUNTER — Ambulatory Visit
Admission: RE | Admit: 2015-11-18 | Discharge: 2015-11-18 | Disposition: A | Discharge status: Home / Self Care | Payer: Medicaid Other | Source: Ambulatory Visit | Attending: Internal Medicine | Admitting: Internal Medicine

## 2015-11-18 DIAGNOSIS — R928 Other abnormal and inconclusive findings on diagnostic imaging of breast: Secondary | ICD-10-CM

## 2015-11-18 DIAGNOSIS — N6320 Unspecified lump in the left breast, unspecified quadrant: Secondary | ICD-10-CM

## 2015-11-23 ENCOUNTER — Other Ambulatory Visit: Payer: Self-pay | Admitting: Internal Medicine

## 2015-11-23 ENCOUNTER — Ambulatory Visit
Admission: RE | Admit: 2015-11-23 | Discharge: 2015-11-23 | Disposition: A | Discharge status: Home / Self Care | Payer: Medicaid Other | Source: Ambulatory Visit | Attending: Internal Medicine | Admitting: Internal Medicine

## 2015-11-23 DIAGNOSIS — N6320 Unspecified lump in the left breast, unspecified quadrant: Secondary | ICD-10-CM

## 2016-01-25 ENCOUNTER — Encounter (HOSPITAL_COMMUNITY): Payer: Self-pay | Admitting: Psychiatry

## 2016-01-25 ENCOUNTER — Ambulatory Visit (INDEPENDENT_AMBULATORY_CARE_PROVIDER_SITE_OTHER): Payer: Self-pay | Admitting: Psychiatry

## 2016-01-25 VITALS — BP 142/80 | HR 108 | Ht 64.0 in | Wt 160.2 lb

## 2016-01-25 DIAGNOSIS — Z79899 Other long term (current) drug therapy: Secondary | ICD-10-CM

## 2016-01-25 DIAGNOSIS — F411 Generalized anxiety disorder: Secondary | ICD-10-CM

## 2016-01-25 DIAGNOSIS — F319 Bipolar disorder, unspecified: Secondary | ICD-10-CM

## 2016-01-25 DIAGNOSIS — F988 Other specified behavioral and emotional disorders with onset usually occurring in childhood and adolescence: Secondary | ICD-10-CM

## 2016-01-25 DIAGNOSIS — F900 Attention-deficit hyperactivity disorder, predominantly inattentive type: Secondary | ICD-10-CM

## 2016-01-25 MED ORDER — AMPHETAMINE-DEXTROAMPHETAMINE 30 MG PO TABS: 30.0000 mg | ORAL_TABLET | Freq: Two times a day (BID) | ORAL | Status: DC

## 2016-01-25 MED ORDER — CLONAZEPAM 0.5 MG PO TABS
0.5000 mg | ORAL_TABLET | Freq: Two times a day (BID) | ORAL | Status: DC
Start: 2016-01-25 — End: 2016-04-27

## 2016-01-25 MED ORDER — LURASIDONE HCL 40 MG PO TABS: 80.0000 mg | ORAL_TABLET | Freq: Every day | ORAL | Status: DC

## 2016-01-25 MED ORDER — LAMOTRIGINE 200 MG PO TABS: 200.0000 mg | ORAL_TABLET | Freq: Every day | ORAL | Status: DC

## 2016-01-25 MED ORDER — AMITRIPTYLINE HCL 150 MG PO TABS: 150.0000 mg | ORAL_TABLET | Freq: Every day | ORAL | Status: DC
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
## 2016-03-29 ENCOUNTER — Other Ambulatory Visit (HOSPITAL_COMMUNITY): Payer: Self-pay

## 2016-03-30 ENCOUNTER — Other Ambulatory Visit (HOSPITAL_COMMUNITY): Payer: Self-pay | Admitting: Psychiatry

## 2016-04-27 ENCOUNTER — Ambulatory Visit (INDEPENDENT_AMBULATORY_CARE_PROVIDER_SITE_OTHER): Payer: Self-pay | Admitting: Psychiatry

## 2016-04-27 ENCOUNTER — Encounter (HOSPITAL_COMMUNITY): Payer: Self-pay | Admitting: Psychiatry

## 2016-04-27 DIAGNOSIS — F909 Attention-deficit hyperactivity disorder, unspecified type: Secondary | ICD-10-CM

## 2016-04-27 DIAGNOSIS — F411 Generalized anxiety disorder: Secondary | ICD-10-CM

## 2016-04-27 DIAGNOSIS — F988 Other specified behavioral and emotional disorders with onset usually occurring in childhood and adolescence: Secondary | ICD-10-CM

## 2016-04-27 DIAGNOSIS — F319 Bipolar disorder, unspecified: Secondary | ICD-10-CM

## 2016-04-27 MED ORDER — AMITRIPTYLINE HCL 150 MG PO TABS: 150.0000 mg | ORAL_TABLET | Freq: Every day | ORAL | 3 refills | Status: DC

## 2016-04-27 MED ORDER — AMPHETAMINE-DEXTROAMPHETAMINE 30 MG PO TABS: 30.0000 mg | ORAL_TABLET | Freq: Two times a day (BID) | ORAL | 0 refills | Status: DC

## 2016-04-27 MED ORDER — LURASIDONE HCL 40 MG PO TABS: 80.0000 mg | ORAL_TABLET | Freq: Every day | ORAL | 3 refills | Status: DC

## 2016-04-27 MED ORDER — CLONAZEPAM 0.5 MG PO TABS: 0.5000 mg | ORAL_TABLET | Freq: Two times a day (BID) | ORAL | 3 refills | Status: DC

## 2016-04-27 MED ORDER — LAMOTRIGINE 200 MG PO TABS: 200.0000 mg | ORAL_TABLET | Freq: Every day | ORAL | 3 refills | Status: DC

## 2016-04-27 NOTE — Patient Instructions (Signed)
1. Labs 2. EKG call [phone]  
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
## 2016-06-28 ENCOUNTER — Ambulatory Visit (HOSPITAL_COMMUNITY)
Admission: RE | Admit: 2016-06-28 | Discharge: 2016-06-28 | Disposition: A | Discharge status: Home / Self Care | Payer: Medicaid Other | Source: Ambulatory Visit | Attending: Psychiatry | Admitting: Psychiatry

## 2016-06-28 DIAGNOSIS — Z79899 Other long term (current) drug therapy: Secondary | ICD-10-CM | POA: Insufficient documentation

## 2016-06-28 LAB — CBC
HCT: 35.2 % (ref 35.0–45.0)
Hemoglobin: 11.8 g/dL (ref 11.7–15.5)
MCH: 29.8 pg (ref 27.0–33.0)
MCHC: 33.5 g/dL (ref 32.0–36.0)
MCV: 88.9 fL (ref 80.0–100.0)
MPV: 9.3 fL (ref 7.5–12.5)
Platelets: 351 10*3/uL (ref 140–400)
RBC: 3.96 MIL/uL (ref 3.80–5.10)
RDW: 14 % (ref 11.0–15.0)
WBC: 8.2 10*3/uL (ref 3.8–10.8)

## 2016-06-29 LAB — LIPID PANEL
Cholesterol: 167 mg/dL (ref 125–200)
HDL: 36 mg/dL — ABNORMAL LOW (ref >=46)
LDL Cholesterol: 103 mg/dL (ref <130)
Total CHOL/HDL Ratio: 4.6 Ratio (ref <=5.0)
Triglycerides: 139 mg/dL (ref <150)
VLDL: 28 mg/dL (ref <30)

## 2016-06-29 LAB — COMPREHENSIVE METABOLIC PANEL
ALT: 11 U/L (ref 6–29)
AST: 12 U/L (ref 10–30)
Albumin: 4 g/dL (ref 3.6–5.1)
Alkaline Phosphatase: 66 U/L (ref 33–115)
BUN: 13 mg/dL (ref 7–25)
CO2: 26 mmol/L (ref 20–31)
Calcium: 8.9 mg/dL (ref 8.6–10.2)
Chloride: 103 mmol/L (ref 98–110)
Creat: 0.8 mg/dL (ref 0.50–1.10)
Glucose, Bld: 78 mg/dL (ref 65–99)
Potassium: 3.6 mmol/L (ref 3.5–5.3)
Sodium: 136 mmol/L (ref 135–146)
Total Bilirubin: 0.2 mg/dL (ref 0.2–1.2)
Total Protein: 5.9 g/dL — ABNORMAL LOW (ref 6.1–8.1)

## 2016-06-29 LAB — TSH: TSH: 4.51 mIU/L — ABNORMAL HIGH

## 2016-06-29 LAB — HEMOGLOBIN A1C
Hgb A1c MFr Bld: 5 % (ref <5.7)
Mean Plasma Glucose: 97 mg/dL

## 2016-06-29 LAB — PROLACTIN: Prolactin: 53.1 ng/mL — ABNORMAL HIGH

## 2016-07-28 ENCOUNTER — Other Ambulatory Visit (HOSPITAL_COMMUNITY): Payer: Self-pay

## 2016-07-28 DIAGNOSIS — F319 Bipolar disorder, unspecified: Secondary | ICD-10-CM

## 2016-07-28 DIAGNOSIS — F411 Generalized anxiety disorder: Secondary | ICD-10-CM

## 2016-07-28 MED ORDER — CLONAZEPAM 0.5 MG PO TABS
0.5000 mg | ORAL_TABLET | Freq: Two times a day (BID) | ORAL | 1 refills | Status: DC
Start: 2016-07-28 — End: 2016-09-19

## 2016-07-28 MED ORDER — LURASIDONE HCL 40 MG PO TABS: 80.0000 mg | ORAL_TABLET | Freq: Every day | ORAL | 1 refills | Status: DC

## 2016-07-28 MED ORDER — LAMOTRIGINE 200 MG PO TABS: 200.0000 mg | ORAL_TABLET | Freq: Every day | ORAL | 1 refills | Status: DC

## 2016-07-28 MED ORDER — AMITRIPTYLINE HCL 150 MG PO TABS: 150.0000 mg | ORAL_TABLET | Freq: Every day | ORAL | 1 refills | Status: DC

## 2016-07-31 ENCOUNTER — Telehealth (HOSPITAL_COMMUNITY): Payer: Self-pay

## 2016-07-31 NOTE — Telephone Encounter (Signed)
Patient is calling for a refill on her Adderall, she has a follow up in January, please review and advise, thank you

## 2016-08-01 ENCOUNTER — Other Ambulatory Visit (HOSPITAL_COMMUNITY): Payer: Self-pay

## 2016-08-01 ENCOUNTER — Telehealth (HOSPITAL_COMMUNITY): Payer: Self-pay | Admitting: Psychiatry

## 2016-08-01 DIAGNOSIS — F900 Attention-deficit hyperactivity disorder, predominantly inattentive type: Secondary | ICD-10-CM

## 2016-08-01 MED ORDER — AMPHETAMINE-DEXTROAMPHETAMINE 30 MG PO TABS: 30.0000 mg | ORAL_TABLET | Freq: Two times a day (BID) | ORAL | 0 refills | Status: DC

## 2016-08-01 NOTE — Telephone Encounter (Signed)
Patient picked up prescription on 81/19/1411/14/17 lic [id number] dlo

## 2016-08-01 NOTE — Telephone Encounter (Signed)
Yes we can refill 

## 2016-08-01 NOTE — Telephone Encounter (Signed)
Printed and put up front for the patient, I called and let her know it was ready for pick up

## 2016-08-03 ENCOUNTER — Ambulatory Visit (HOSPITAL_COMMUNITY): Payer: Self-pay | Admitting: Psychiatry

## 2016-08-23 ENCOUNTER — Telehealth (HOSPITAL_COMMUNITY): Payer: Self-pay

## 2016-08-23 DIAGNOSIS — F900 Attention-deficit hyperactivity disorder, predominantly inattentive type: Secondary | ICD-10-CM

## 2016-08-23 NOTE — Telephone Encounter (Signed)
Patient is calling for a refill on her Adderall - she has a follow up next month. Please review and advise, thank you

## 2016-08-24 MED ORDER — AMPHETAMINE-DEXTROAMPHETAMINE 30 MG PO TABS: 30.0000 mg | ORAL_TABLET | Freq: Two times a day (BID) | ORAL | 0 refills | Status: DC

## 2016-08-24 NOTE — Telephone Encounter (Signed)
Approved and signed by Dr. Ladona Ridgelaylor, called patient and left a voicemail that it was ready for pick up.

## 2016-08-28 ENCOUNTER — Telehealth (HOSPITAL_COMMUNITY): Payer: Self-pay | Admitting: Psychiatry

## 2016-08-28 NOTE — Telephone Encounter (Signed)
08/28/16 ZO#[id number] patient came to pick-up rx scrip/sh

## 2016-09-19 ENCOUNTER — Encounter (HOSPITAL_COMMUNITY): Payer: Self-pay | Admitting: Psychiatry

## 2016-09-19 ENCOUNTER — Ambulatory Visit (INDEPENDENT_AMBULATORY_CARE_PROVIDER_SITE_OTHER): Payer: Medicaid Other | Admitting: Psychiatry

## 2016-09-19 DIAGNOSIS — Z79891 Long term (current) use of opiate analgesic: Secondary | ICD-10-CM | POA: Diagnosis not present

## 2016-09-19 DIAGNOSIS — Z79899 Other long term (current) drug therapy: Secondary | ICD-10-CM

## 2016-09-19 DIAGNOSIS — Z818 Family history of other mental and behavioral disorders: Secondary | ICD-10-CM

## 2016-09-19 DIAGNOSIS — F900 Attention-deficit hyperactivity disorder, predominantly inattentive type: Secondary | ICD-10-CM

## 2016-09-19 DIAGNOSIS — F411 Generalized anxiety disorder: Secondary | ICD-10-CM | POA: Diagnosis not present

## 2016-09-19 DIAGNOSIS — F319 Bipolar disorder, unspecified: Secondary | ICD-10-CM | POA: Diagnosis not present

## 2016-09-19 MED ORDER — LAMOTRIGINE 200 MG PO TABS: 200.0000 mg | ORAL_TABLET | Freq: Every day | ORAL | 2 refills | Status: DC

## 2016-09-19 MED ORDER — AMITRIPTYLINE HCL 100 MG PO TABS: 100.0000 mg | ORAL_TABLET | Freq: Every day | ORAL | 2 refills | Status: DC

## 2016-09-19 MED ORDER — AMPHETAMINE-DEXTROAMPHETAMINE 30 MG PO TABS: 30.0000 mg | ORAL_TABLET | Freq: Two times a day (BID) | ORAL | 0 refills | Status: DC

## 2016-09-19 MED ORDER — CLONAZEPAM 0.5 MG PO TABS: 0.5000 mg | ORAL_TABLET | Freq: Three times a day (TID) | ORAL | 2 refills | Status: DC | PRN

## 2016-09-19 MED ORDER — LURASIDONE HCL 120 MG PO TABS: 120.0000 mg | ORAL_TABLET | Freq: Every day | ORAL | 2 refills | Status: DC
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
## 2016-09-29 ENCOUNTER — Other Ambulatory Visit: Payer: Self-pay | Admitting: Internal Medicine

## 2016-09-29 DIAGNOSIS — Z1231 Encounter for screening mammogram for malignant neoplasm of breast: Secondary | ICD-10-CM

## 2016-11-09 ENCOUNTER — Ambulatory Visit
Admission: RE | Admit: 2016-11-09 | Discharge: 2016-11-09 | Disposition: A | Discharge status: Home / Self Care | Payer: Medicaid Other | Source: Ambulatory Visit | Attending: Internal Medicine | Admitting: Internal Medicine

## 2016-11-09 DIAGNOSIS — Z1231 Encounter for screening mammogram for malignant neoplasm of breast: Secondary | ICD-10-CM

## 2016-11-13 ENCOUNTER — Other Ambulatory Visit: Payer: Self-pay | Admitting: Internal Medicine

## 2016-11-13 DIAGNOSIS — R928 Other abnormal and inconclusive findings on diagnostic imaging of breast: Secondary | ICD-10-CM

## 2016-11-16 ENCOUNTER — Other Ambulatory Visit (HOSPITAL_COMMUNITY): Payer: Self-pay | Admitting: Psychiatry

## 2016-11-16 DIAGNOSIS — F411 Generalized anxiety disorder: Secondary | ICD-10-CM

## 2016-11-16 DIAGNOSIS — F319 Bipolar disorder, unspecified: Secondary | ICD-10-CM

## 2016-11-21 ENCOUNTER — Ambulatory Visit (HOSPITAL_COMMUNITY): Payer: Self-pay | Admitting: Psychiatry

## 2016-11-23 ENCOUNTER — Encounter (HOSPITAL_COMMUNITY): Payer: Self-pay | Admitting: Psychiatry

## 2016-11-23 ENCOUNTER — Ambulatory Visit (INDEPENDENT_AMBULATORY_CARE_PROVIDER_SITE_OTHER): Payer: Medicaid Other | Admitting: Psychiatry

## 2016-11-23 DIAGNOSIS — F319 Bipolar disorder, unspecified: Secondary | ICD-10-CM | POA: Diagnosis not present

## 2016-11-23 DIAGNOSIS — Z79891 Long term (current) use of opiate analgesic: Secondary | ICD-10-CM

## 2016-11-23 DIAGNOSIS — F411 Generalized anxiety disorder: Secondary | ICD-10-CM | POA: Diagnosis not present

## 2016-11-23 DIAGNOSIS — Z79899 Other long term (current) drug therapy: Secondary | ICD-10-CM | POA: Diagnosis not present

## 2016-11-23 DIAGNOSIS — F900 Attention-deficit hyperactivity disorder, predominantly inattentive type: Secondary | ICD-10-CM | POA: Diagnosis not present

## 2016-11-23 DIAGNOSIS — Z818 Family history of other mental and behavioral disorders: Secondary | ICD-10-CM | POA: Diagnosis not present

## 2016-11-23 MED ORDER — AMPHETAMINE-DEXTROAMPHETAMINE 30 MG PO TABS: 30.0000 mg | ORAL_TABLET | Freq: Two times a day (BID) | ORAL | 0 refills | Status: DC

## 2016-11-23 MED ORDER — LAMOTRIGINE 200 MG PO TABS: 200.0000 mg | ORAL_TABLET | Freq: Every day | ORAL | 2 refills | Status: DC

## 2016-11-23 MED ORDER — CLONAZEPAM 0.5 MG PO TABS: 0.5000 mg | ORAL_TABLET | Freq: Three times a day (TID) | ORAL | 2 refills | Status: DC | PRN

## 2016-11-23 MED ORDER — AMITRIPTYLINE HCL 50 MG PO TABS: 50.0000 mg | ORAL_TABLET | Freq: Every day | ORAL | 2 refills | Status: DC

## 2016-11-23 MED ORDER — LURASIDONE HCL 120 MG PO TABS: 120.0000 mg | ORAL_TABLET | Freq: Every day | ORAL | 2 refills | Status: DC
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
## 2016-11-24 ENCOUNTER — Ambulatory Visit
Admission: RE | Admit: 2016-11-24 | Discharge: 2016-11-24 | Disposition: A | Discharge status: Home / Self Care | Payer: Medicaid Other | Source: Ambulatory Visit | Attending: Internal Medicine | Admitting: Internal Medicine

## 2016-11-24 DIAGNOSIS — R928 Other abnormal and inconclusive findings on diagnostic imaging of breast: Secondary | ICD-10-CM

## 2017-01-16 ENCOUNTER — Other Ambulatory Visit (HOSPITAL_COMMUNITY): Payer: Self-pay | Admitting: Psychiatry

## 2017-01-16 DIAGNOSIS — F319 Bipolar disorder, unspecified: Secondary | ICD-10-CM

## 2017-02-16 ENCOUNTER — Other Ambulatory Visit (HOSPITAL_COMMUNITY): Payer: Self-pay

## 2017-02-16 ENCOUNTER — Other Ambulatory Visit (HOSPITAL_COMMUNITY): Payer: Self-pay | Admitting: Psychiatry

## 2017-02-16 DIAGNOSIS — F319 Bipolar disorder, unspecified: Secondary | ICD-10-CM

## 2017-02-16 DIAGNOSIS — F411 Generalized anxiety disorder: Secondary | ICD-10-CM

## 2017-02-16 DIAGNOSIS — F900 Attention-deficit hyperactivity disorder, predominantly inattentive type: Secondary | ICD-10-CM

## 2017-02-16 MED ORDER — CLONAZEPAM 0.5 MG PO TABS: 0.5000 mg | ORAL_TABLET | Freq: Three times a day (TID) | ORAL | 0 refills | Status: DC | PRN

## 2017-02-16 MED ORDER — AMPHETAMINE-DEXTROAMPHETAMINE 30 MG PO TABS: 30.0000 mg | ORAL_TABLET | Freq: Two times a day (BID) | ORAL | 0 refills | Status: DC

## 2017-02-16 MED ORDER — LAMOTRIGINE 200 MG PO TABS: 200.0000 mg | ORAL_TABLET | Freq: Every day | ORAL | 0 refills | Status: DC

## 2017-02-16 MED ORDER — LURASIDONE HCL 120 MG PO TABS: 120.0000 mg | ORAL_TABLET | Freq: Every day | ORAL | 0 refills | Status: DC

## 2017-02-16 MED ORDER — AMITRIPTYLINE HCL 50 MG PO TABS: 50.0000 mg | ORAL_TABLET | Freq: Every day | ORAL | 0 refills | Status: DC

## 2017-03-01 ENCOUNTER — Ambulatory Visit (INDEPENDENT_AMBULATORY_CARE_PROVIDER_SITE_OTHER): Payer: Medicaid Other | Admitting: Psychiatry

## 2017-03-01 ENCOUNTER — Encounter (HOSPITAL_COMMUNITY): Payer: Self-pay | Admitting: Psychiatry

## 2017-03-01 DIAGNOSIS — F319 Bipolar disorder, unspecified: Secondary | ICD-10-CM

## 2017-03-01 DIAGNOSIS — Z79899 Other long term (current) drug therapy: Secondary | ICD-10-CM | POA: Diagnosis not present

## 2017-03-01 DIAGNOSIS — F900 Attention-deficit hyperactivity disorder, predominantly inattentive type: Secondary | ICD-10-CM

## 2017-03-01 DIAGNOSIS — Z791 Long term (current) use of non-steroidal anti-inflammatories (NSAID): Secondary | ICD-10-CM

## 2017-03-01 DIAGNOSIS — F17210 Nicotine dependence, cigarettes, uncomplicated: Secondary | ICD-10-CM

## 2017-03-01 DIAGNOSIS — Z818 Family history of other mental and behavioral disorders: Secondary | ICD-10-CM

## 2017-03-01 DIAGNOSIS — Z886 Allergy status to analgesic agent status: Secondary | ICD-10-CM | POA: Diagnosis not present

## 2017-03-01 DIAGNOSIS — Z79891 Long term (current) use of opiate analgesic: Secondary | ICD-10-CM

## 2017-03-01 DIAGNOSIS — F3130 Bipolar disorder, current episode depressed, mild or moderate severity, unspecified: Secondary | ICD-10-CM | POA: Diagnosis not present

## 2017-03-01 DIAGNOSIS — F411 Generalized anxiety disorder: Secondary | ICD-10-CM | POA: Diagnosis not present

## 2017-03-01 MED ORDER — AMPHETAMINE-DEXTROAMPHETAMINE 30 MG PO TABS: 30.0000 mg | ORAL_TABLET | Freq: Two times a day (BID) | ORAL | 0 refills | Status: DC

## 2017-03-01 MED ORDER — LAMOTRIGINE 200 MG PO TABS: 200.0000 mg | ORAL_TABLET | Freq: Every day | ORAL | 0 refills | Status: DC

## 2017-03-01 MED ORDER — LURASIDONE HCL 120 MG PO TABS: 120.0000 mg | ORAL_TABLET | Freq: Every day | ORAL | 0 refills | Status: DC

## 2017-03-01 MED ORDER — CLONAZEPAM 0.5 MG PO TABS: 0.5000 mg | ORAL_TABLET | Freq: Three times a day (TID) | ORAL | 2 refills | Status: DC | PRN
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
## 2017-04-16 ENCOUNTER — Emergency Department (HOSPITAL_COMMUNITY)
Admission: EM | Admit: 2017-04-16 | Discharge: 2017-04-16 | Disposition: A | Discharge status: Home / Self Care | Payer: Medicaid Other | Attending: Emergency Medicine | Admitting: Emergency Medicine

## 2017-04-16 ENCOUNTER — Encounter (HOSPITAL_COMMUNITY): Payer: Self-pay | Admitting: Emergency Medicine

## 2017-04-16 DIAGNOSIS — M25561 Pain in right knee: Secondary | ICD-10-CM | POA: Insufficient documentation

## 2017-04-16 DIAGNOSIS — F17210 Nicotine dependence, cigarettes, uncomplicated: Secondary | ICD-10-CM | POA: Insufficient documentation

## 2017-04-16 DIAGNOSIS — M25562 Pain in left knee: Secondary | ICD-10-CM | POA: Diagnosis present

## 2017-04-16 DIAGNOSIS — J449 Chronic obstructive pulmonary disease, unspecified: Secondary | ICD-10-CM | POA: Diagnosis not present

## 2017-04-16 DIAGNOSIS — F909 Attention-deficit hyperactivity disorder, unspecified type: Secondary | ICD-10-CM | POA: Insufficient documentation

## 2017-04-16 DIAGNOSIS — Z79899 Other long term (current) drug therapy: Secondary | ICD-10-CM | POA: Diagnosis not present

## 2017-04-16 DIAGNOSIS — G629 Polyneuropathy, unspecified: Secondary | ICD-10-CM | POA: Diagnosis not present

## 2017-04-16 DIAGNOSIS — J45909 Unspecified asthma, uncomplicated: Secondary | ICD-10-CM | POA: Diagnosis not present
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
## 2017-04-16 NOTE — ED Triage Notes (Signed)
Pt reports falling yesterday morning and hitting both knees and now is having numbness in knees and palm of left hand.

## 2017-04-16 NOTE — Discharge Instructions (Signed)
Your hand and wrist numbness may be carpal tunnel syndrome.  Please wear the splints at night.  Please follow-up with your doctor.  You may also have a pinched nerve in your neck and/or back.  Please follow-up with your doctor about this as well.  You may need an MRI.

## 2017-04-29 ENCOUNTER — Emergency Department (HOSPITAL_COMMUNITY): Payer: Medicaid Other

## 2017-04-29 ENCOUNTER — Emergency Department (HOSPITAL_COMMUNITY)
Admission: EM | Admit: 2017-04-29 | Discharge: 2017-04-29 | Disposition: A | Discharge status: Home / Self Care | Payer: Medicaid Other | Attending: Emergency Medicine | Admitting: Emergency Medicine

## 2017-04-29 ENCOUNTER — Encounter (HOSPITAL_COMMUNITY): Payer: Self-pay

## 2017-04-29 DIAGNOSIS — R51 Headache: Secondary | ICD-10-CM | POA: Diagnosis present

## 2017-04-29 DIAGNOSIS — Z9181 History of falling: Secondary | ICD-10-CM | POA: Diagnosis not present

## 2017-04-29 DIAGNOSIS — R531 Weakness: Secondary | ICD-10-CM | POA: Diagnosis not present

## 2017-04-29 DIAGNOSIS — Z79899 Other long term (current) drug therapy: Secondary | ICD-10-CM | POA: Insufficient documentation

## 2017-04-29 DIAGNOSIS — J449 Chronic obstructive pulmonary disease, unspecified: Secondary | ICD-10-CM | POA: Diagnosis not present

## 2017-04-29 DIAGNOSIS — R519 Headache, unspecified: Secondary | ICD-10-CM

## 2017-04-29 DIAGNOSIS — F17210 Nicotine dependence, cigarettes, uncomplicated: Secondary | ICD-10-CM | POA: Insufficient documentation

## 2017-04-29 DIAGNOSIS — J45909 Unspecified asthma, uncomplicated: Secondary | ICD-10-CM | POA: Diagnosis not present

## 2017-04-29 LAB — CBC WITH DIFFERENTIAL/PLATELET
Basophils Absolute: 0 10*3/uL (ref 0.0–0.1)
Basophils Relative: 0 %
Eosinophils Absolute: 0.3 10*3/uL (ref 0.0–0.7)
Eosinophils Relative: 3 %
HCT: 32.2 % — ABNORMAL LOW (ref 36.0–46.0)
Hemoglobin: 11.4 g/dL — ABNORMAL LOW (ref 12.0–15.0)
Lymphocytes Relative: 16 %
Lymphs Abs: 1.6 10*3/uL (ref 0.7–4.0)
MCH: 30.6 pg (ref 26.0–34.0)
MCHC: 35.4 g/dL (ref 30.0–36.0)
MCV: 86.3 fL (ref 78.0–100.0)
Monocytes Absolute: 0.6 10*3/uL (ref 0.1–1.0)
Monocytes Relative: 6 %
Neutro Abs: 7.1 10*3/uL (ref 1.7–7.7)
Neutrophils Relative %: 75 %
Platelets: 298 10*3/uL (ref 150–400)
RBC: 3.73 MIL/uL — ABNORMAL LOW (ref 3.87–5.11)
RDW: 12.7 % (ref 11.5–15.5)
WBC: 9.5 10*3/uL (ref 4.0–10.5)

## 2017-04-29 LAB — COMPREHENSIVE METABOLIC PANEL
ALT: 27 U/L (ref 14–54)
AST: 60 U/L — ABNORMAL HIGH (ref 15–41)
Albumin: 4 g/dL (ref 3.5–5.0)
Alkaline Phosphatase: 65 U/L (ref 38–126)
Anion gap: 7 (ref 5–15)
BUN: 26 mg/dL — ABNORMAL HIGH (ref 6–20)
CO2: 21 mmol/L — ABNORMAL LOW (ref 22–32)
Calcium: 9 mg/dL (ref 8.9–10.3)
Chloride: 110 mmol/L (ref 101–111)
Creatinine, Ser: 0.89 mg/dL (ref 0.44–1.00)
GFR calc Af Amer: >60 mL/min (ref >60)
GFR calc non Af Amer: >60 mL/min (ref >60)
Glucose, Bld: 129 mg/dL — ABNORMAL HIGH (ref 65–99)
Potassium: 3.1 mmol/L — ABNORMAL LOW (ref 3.5–5.1)
Sodium: 138 mmol/L (ref 135–145)
Total Bilirubin: 0.5 mg/dL (ref 0.3–1.2)
Total Protein: 6.4 g/dL — ABNORMAL LOW (ref 6.5–8.1)

## 2017-04-29 LAB — ETHANOL: Alcohol, Ethyl (B): <5 mg/dL (ref <5)

## 2017-04-29 NOTE — Discharge Instructions (Signed)
Tests show no life-threatening condition. He must follow-up your primary care doctor. He will most likely recommend an MRI scan of your brain.

## 2017-04-29 NOTE — ED Triage Notes (Signed)
Patient here with c/o frequently falling and she had a fall this morning and couple yesterday. Pt state she hitting both kness. Pt also complain of numbness to both hand.

## 2017-04-29 NOTE — ED Notes (Signed)
Pt asked if she obtained urine specimen when she went to restroom earlier. Pt states she did not obtain sample. Pt is difficult to keep awake while taking vital signs.

## 2017-04-29 NOTE — ED Notes (Signed)
She is comfortably sleeping.
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
## 2017-05-09 ENCOUNTER — Ambulatory Visit (INDEPENDENT_AMBULATORY_CARE_PROVIDER_SITE_OTHER): Payer: Medicaid Other | Admitting: Orthopaedic Surgery

## 2017-05-09 DIAGNOSIS — G8929 Other chronic pain: Secondary | ICD-10-CM

## 2017-05-09 DIAGNOSIS — M25562 Pain in left knee: Secondary | ICD-10-CM | POA: Diagnosis not present

## 2017-05-09 DIAGNOSIS — M25561 Pain in right knee: Secondary | ICD-10-CM
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
## 2017-05-09 NOTE — Addendum Note (Signed)
Addended byPrescott Parma on: 05/09/2017 05:01 PM   Modules accepted: Orders

## 2017-05-10 ENCOUNTER — Other Ambulatory Visit: Payer: Self-pay | Admitting: Neurological Surgery

## 2017-05-11 ENCOUNTER — Other Ambulatory Visit (HOSPITAL_COMMUNITY): Payer: Self-pay | Admitting: Psychiatry

## 2017-05-11 DIAGNOSIS — F411 Generalized anxiety disorder: Secondary | ICD-10-CM

## 2017-05-11 DIAGNOSIS — F319 Bipolar disorder, unspecified: Secondary | ICD-10-CM

## 2017-05-16 NOTE — Pre-Procedure Instructions (Signed)
Nancy Bowers  05/16/2017      [id number] [place], [place] - [address] [place] [address] Phone: [phone] Fax: [phone]  [facility] [place], [place] - [address] [place] [address] Phone: [phone] Fax: [phone]  [id number] [place], [place] - [address] [place] [address] Phone: [phone] Fax: [phone]  [facility] - [place], [place] - [address] [place] [address] Phone: [phone] Fax: [phone]    Your procedure is scheduled on   Tuesday  05/22/17  Report to [facility] Admitting at 530 A.M.  Call this number if you have problems the morning of surgery:  [phone]   Remember:  Do not eat food or drink liquids after midnight.  Take these medicines the morning of surgery with A SIP OF WATER   7 days prior to surgery STOP taking any Aspirin, Aleve, Naproxen, Ibuprofen, Motrin, Advil, Goody's, BC's, all herbal medications, fish oil, and all vitamins   Do not wear jewelry, make-up or nail polish.  Do not wear lotions, powders, or perfumes, or deoderant.  Do not shave 48 hours prior to surgery.  Men may shave face and neck.  Do not bring valuables to the hospital.  [facility] is not responsible for any belongings or valuables.  Contacts, dentures or bridgework may not be worn into surgery.  Leave your suitcase in the car.  After surgery it may be brought to your room.  For patients admitted to the hospital, discharge time will be determined by your treatment team.  Patients discharged the day of surgery will not be allowed to drive home.   Name and phone number of your driver:    Special instructions:  [place] - Preparing for Surgery  Before surgery, you can play an important  role.  Because skin is not sterile, your skin needs to be as free of germs as possible.  You can reduce the number of germs on you skin by washing with CHG (chlorahexidine gluconate) soap before surgery.  CHG is an antiseptic cleaner which kills germs and bonds with the skin to continue killing germs even after washing.  Please DO NOT use if you have an allergy to CHG or antibacterial soaps.  If your skin becomes reddened/irritated stop using the CHG and inform your nurse when you arrive at Short Stay.  Do not shave (including legs and underarms) for at least 48 hours prior to the first CHG shower.  You may shave your face.  Please follow these instructions carefully:   1.  Shower with CHG Soap the night before surgery and the                                morning of Surgery.  2.  If you choose to wash your hair, wash your hair first as usual with your       normal shampoo.  3.  After you shampoo, rinse your hair and body thoroughly to remove the                      Shampoo.  4.  Use CHG as you would any other liquid soap.  You can apply chg directly  to the skin and wash gently with scrungie or a clean washcloth.  5.  Apply the CHG Soap to your body ONLY FROM THE NECK DOWN.        Do not use on open wounds or open sores.  Avoid contact with your eyes,       ears, mouth and genitals (private parts).  Wash genitals (private parts)       with your normal soap.  6.  Wash thoroughly, paying special attention to the area where your surgery        will be performed.  7.  Thoroughly rinse your body with warm water from the neck down.  8.  DO NOT shower/wash with your normal soap after using and rinsing off       the CHG Soap.  9.  Pat yourself dry with a clean towel.            10.  Wear clean pajamas.            11.  Place clean sheets on your bed the night of your first shower and do not        sleep with pets.  Day of Surgery  Do not apply any lotions/deoderants the morning of surgery.   Please wear clean clothes to the hospital/surgery center.    Please read over the following fact sheets that you were given. Coughing and Deep Breathing, MRSA Information and Surgical Site Infection Prevention

## 2017-05-17 ENCOUNTER — Encounter (HOSPITAL_COMMUNITY)
Admission: RE | Admit: 2017-05-17 | Discharge: 2017-05-17 | Disposition: A | Discharge status: Home / Self Care | Payer: Medicaid Other | Source: Ambulatory Visit | Attending: Neurological Surgery | Admitting: Neurological Surgery

## 2017-05-22 ENCOUNTER — Ambulatory Visit (HOSPITAL_COMMUNITY): Admission: RE | Admit: 2017-05-22 | Payer: Medicaid Other | Source: Ambulatory Visit | Admitting: Neurological Surgery

## 2017-05-22 ENCOUNTER — Encounter (HOSPITAL_COMMUNITY): Admission: RE | Payer: Self-pay | Source: Ambulatory Visit

## 2017-05-22 SURGERY — CERVICAL ANTERIOR DISC ARTHROPLASTY
Anesthesia: General

## 2017-05-24 ENCOUNTER — Ambulatory Visit (INDEPENDENT_AMBULATORY_CARE_PROVIDER_SITE_OTHER): Payer: Medicaid Other | Admitting: Orthopaedic Surgery

## 2017-05-24 ENCOUNTER — Encounter (INDEPENDENT_AMBULATORY_CARE_PROVIDER_SITE_OTHER): Payer: Self-pay | Admitting: Orthopaedic Surgery

## 2017-05-24 DIAGNOSIS — M25561 Pain in right knee: Secondary | ICD-10-CM

## 2017-05-24 DIAGNOSIS — G8929 Other chronic pain: Secondary | ICD-10-CM | POA: Diagnosis not present

## 2017-05-24 DIAGNOSIS — M25562 Pain in left knee: Secondary | ICD-10-CM

## 2017-05-24 MED ORDER — TIZANIDINE HCL 4 MG PO TABS: 4.0000 mg | ORAL_TABLET | Freq: Two times a day (BID) | ORAL | 0 refills | Status: DC | PRN

## 2017-05-24 MED ORDER — TRAMADOL HCL 50 MG PO TABS: 100.0000 mg | ORAL_TABLET | Freq: Two times a day (BID) | ORAL | 0 refills | Status: DC | PRN

## 2017-05-24 NOTE — Progress Notes (Signed)
The patient is some I'm seeing again in follow-up for bilateral knee ligamentous instability. She has multiple falls because her knees give out on her knee locking catching. We will obtain an MRI of at least one of her knees but Medicaid did not allow this. She still is having a lot of mechanical falls and symptoms of instability of her knees.  On exam both knees feel stable ligamentously with both a positive Murray signs the medial side of each knee. Both knees have a slight effusion as well.  From very concerned about her fall risk given the fact she has now unknown herniated disc in her neck is been verified an MRI due to her falls. This all started with instability of her knees. I do feel that central this point that we obtain MRIs of her knees and this is medically necessary to rule out instability of her knees and find out why her knees locking catching on her. I available to have offered till we find out this. I did give her prescription for tramadol as well as Zanaflex. We'll see her back once the MRIs been obtained.

## 2017-05-24 NOTE — Addendum Note (Signed)
Addended by: Cherre HugerMAY, Azelia Reiger E on: 05/24/2017 04:48 PM   Modules accepted: Orders

## 2017-05-25 ENCOUNTER — Other Ambulatory Visit: Payer: Self-pay | Admitting: Neurological Surgery

## 2017-05-31 ENCOUNTER — Ambulatory Visit (HOSPITAL_COMMUNITY): Payer: Self-pay | Admitting: Psychiatry

## 2017-05-31 ENCOUNTER — Other Ambulatory Visit (HOSPITAL_COMMUNITY): Payer: Self-pay | Admitting: Psychiatry

## 2017-05-31 DIAGNOSIS — F319 Bipolar disorder, unspecified: Secondary | ICD-10-CM

## 2017-06-04 ENCOUNTER — Encounter (HOSPITAL_COMMUNITY): Payer: Self-pay | Admitting: *Deleted

## 2017-06-05 ENCOUNTER — Encounter (HOSPITAL_COMMUNITY)
Admission: RE | Disposition: A | Discharge status: Home / Self Care | Payer: Self-pay | Source: Ambulatory Visit | Attending: Neurological Surgery

## 2017-06-05 ENCOUNTER — Ambulatory Visit (HOSPITAL_COMMUNITY): Payer: Medicaid Other | Admitting: Anesthesiology

## 2017-06-05 ENCOUNTER — Ambulatory Visit (HOSPITAL_COMMUNITY): Payer: Medicaid Other

## 2017-06-05 ENCOUNTER — Encounter (HOSPITAL_COMMUNITY): Payer: Self-pay

## 2017-06-05 ENCOUNTER — Ambulatory Visit (HOSPITAL_COMMUNITY)
Admission: RE | Admit: 2017-06-05 | Discharge: 2017-06-06 | Disposition: A | Discharge status: Home / Self Care | Payer: Medicaid Other | Source: Ambulatory Visit | Attending: Neurological Surgery | Admitting: Neurological Surgery

## 2017-06-05 DIAGNOSIS — R531 Weakness: Secondary | ICD-10-CM | POA: Diagnosis present

## 2017-06-05 DIAGNOSIS — Z791 Long term (current) use of non-steroidal anti-inflammatories (NSAID): Secondary | ICD-10-CM | POA: Diagnosis not present

## 2017-06-05 DIAGNOSIS — Z7951 Long term (current) use of inhaled steroids: Secondary | ICD-10-CM | POA: Insufficient documentation

## 2017-06-05 DIAGNOSIS — F909 Attention-deficit hyperactivity disorder, unspecified type: Secondary | ICD-10-CM | POA: Insufficient documentation

## 2017-06-05 DIAGNOSIS — M4712 Other spondylosis with myelopathy, cervical region: Secondary | ICD-10-CM | POA: Diagnosis present

## 2017-06-05 DIAGNOSIS — Z79899 Other long term (current) drug therapy: Secondary | ICD-10-CM | POA: Diagnosis not present

## 2017-06-05 DIAGNOSIS — F319 Bipolar disorder, unspecified: Secondary | ICD-10-CM | POA: Insufficient documentation

## 2017-06-05 DIAGNOSIS — M50021 Cervical disc disorder at C4-C5 level with myelopathy: Secondary | ICD-10-CM | POA: Insufficient documentation

## 2017-06-05 DIAGNOSIS — Z419 Encounter for procedure for purposes other than remedying health state, unspecified: Secondary | ICD-10-CM

## 2017-06-05 DIAGNOSIS — F17210 Nicotine dependence, cigarettes, uncomplicated: Secondary | ICD-10-CM | POA: Diagnosis not present

## 2017-06-05 DIAGNOSIS — E119 Type 2 diabetes mellitus without complications: Secondary | ICD-10-CM | POA: Diagnosis not present

## 2017-06-05 DIAGNOSIS — J449 Chronic obstructive pulmonary disease, unspecified: Secondary | ICD-10-CM | POA: Insufficient documentation

## 2017-06-05 HISTORY — PX: CERVICAL DISC ARTHROPLASTY: SHX587

## 2017-06-05 LAB — CBC
HCT: 36.9 % (ref 36.0–46.0)
Hemoglobin: 12.4 g/dL (ref 12.0–15.0)
MCH: 30.2 pg (ref 26.0–34.0)
MCHC: 33.6 g/dL (ref 30.0–36.0)
MCV: 90 fL (ref 78.0–100.0)
Platelets: 338 10*3/uL (ref 150–400)
RBC: 4.1 MIL/uL (ref 3.87–5.11)
RDW: 12.6 % (ref 11.5–15.5)
WBC: 10.1 10*3/uL (ref 4.0–10.5)

## 2017-06-05 LAB — BASIC METABOLIC PANEL
Anion gap: 4 — ABNORMAL LOW (ref 5–15)
BUN: 9 mg/dL (ref 6–20)
CO2: 25 mmol/L (ref 22–32)
Calcium: 8.9 mg/dL (ref 8.9–10.3)
Chloride: 108 mmol/L (ref 101–111)
Creatinine, Ser: 0.59 mg/dL (ref 0.44–1.00)
GFR calc Af Amer: >60 mL/min (ref >60)
GFR calc non Af Amer: >60 mL/min (ref >60)
Glucose, Bld: 106 mg/dL — ABNORMAL HIGH (ref 65–99)
Potassium: 3.8 mmol/L (ref 3.5–5.1)
Sodium: 137 mmol/L (ref 135–145)

## 2017-06-05 LAB — TYPE AND SCREEN
ABO/RH(D): O NEG
Antibody Screen: NEGATIVE

## 2017-06-05 LAB — ABO/RH: ABO/RH(D): O NEG

## 2017-06-05 LAB — HCG, SERUM, QUALITATIVE: Preg, Serum: NEGATIVE

## 2017-06-05 SURGERY — CERVICAL ANTERIOR DISC ARTHROPLASTY
Anesthesia: General

## 2017-06-05 MED ORDER — THROMBIN 5000 UNITS EX SOLR
CUTANEOUS | Status: DC | PRN
  Administered 2017-06-05 (×2): 5000 [IU] via TOPICAL

## 2017-06-05 MED ORDER — LIDOCAINE-EPINEPHRINE 2 %-1:100000 IJ SOLN
INTRAMUSCULAR | Status: DC | PRN
  Administered 2017-06-05: 5 mL

## 2017-06-05 MED ORDER — METHOCARBAMOL 750 MG PO TABS
750.0000 mg | ORAL_TABLET | Freq: Four times a day (QID) | ORAL | Status: DC
  Administered 2017-06-05 – 2017-06-06 (×4): 750 mg via ORAL
  Filled 2017-06-05 (×3): qty 1

## 2017-06-05 MED ORDER — PANTOPRAZOLE SODIUM 40 MG PO TBEC
40.0000 mg | DELAYED_RELEASE_TABLET | Freq: Every day | ORAL | Status: DC
  Administered 2017-06-05: 40 mg via ORAL
  Filled 2017-06-05: qty 1

## 2017-06-05 MED ORDER — LIDOCAINE 2% (20 MG/ML) 5 ML SYRINGE
INTRAMUSCULAR | Status: AC
  Filled 2017-06-05: qty 5

## 2017-06-05 MED ORDER — PHENOL 1.4 % MT LIQD: 1.0000 | OROMUCOSAL | Status: DC | PRN

## 2017-06-05 MED ORDER — LURASIDONE HCL 60 MG PO TABS
120.0000 mg | ORAL_TABLET | Freq: Every day | ORAL | Status: DC
  Administered 2017-06-05: 120 mg via ORAL
  Filled 2017-06-05: qty 2

## 2017-06-05 MED ORDER — CEFAZOLIN SODIUM-DEXTROSE 2-4 GM/100ML-% IV SOLN
INTRAVENOUS | Status: AC
  Filled 2017-06-05: qty 100

## 2017-06-05 MED ORDER — KETAMINE HCL 10 MG/ML IJ SOLN
INTRAMUSCULAR | Status: DC | PRN
  Administered 2017-06-05: 40 mg via INTRAVENOUS

## 2017-06-05 MED ORDER — ONDANSETRON HCL 4 MG/2ML IJ SOLN
INTRAMUSCULAR | Status: AC
  Filled 2017-06-05: qty 2

## 2017-06-05 MED ORDER — PANTOPRAZOLE SODIUM 40 MG IV SOLR: 40.0000 mg | Freq: Every day | INTRAVENOUS | Status: DC

## 2017-06-05 MED ORDER — BISACODYL 10 MG RE SUPP: 10.0000 mg | Freq: Every day | RECTAL | Status: DC | PRN

## 2017-06-05 MED ORDER — FENTANYL CITRATE (PF) 250 MCG/5ML IJ SOLN
INTRAMUSCULAR | Status: AC
  Filled 2017-06-05: qty 5

## 2017-06-05 MED ORDER — CEFAZOLIN SODIUM-DEXTROSE 2-4 GM/100ML-% IV SOLN
2.0000 g | INTRAVENOUS | Status: AC
  Administered 2017-06-05: 2 g via INTRAVENOUS

## 2017-06-05 MED ORDER — CLONAZEPAM 0.5 MG PO TABS: 0.5000 mg | ORAL_TABLET | Freq: Three times a day (TID) | ORAL | Status: DC | PRN

## 2017-06-05 MED ORDER — ONDANSETRON HCL 4 MG/2ML IJ SOLN
INTRAMUSCULAR | Status: DC | PRN
  Administered 2017-06-05: 4 mg via INTRAVENOUS

## 2017-06-05 MED ORDER — ROCURONIUM BROMIDE 100 MG/10ML IV SOLN
INTRAVENOUS | Status: DC | PRN
  Administered 2017-06-05: 50 mg via INTRAVENOUS

## 2017-06-05 MED ORDER — LABETALOL HCL 5 MG/ML IV SOLN
10.0000 mg | Freq: Once | INTRAVENOUS | Status: AC
  Administered 2017-06-05: 10 mg via INTRAVENOUS

## 2017-06-05 MED ORDER — HYDROMORPHONE HCL 1 MG/ML IJ SOLN
INTRAMUSCULAR | Status: AC
  Filled 2017-06-05: qty 1

## 2017-06-05 MED ORDER — GABAPENTIN 300 MG PO CAPS
300.0000 mg | ORAL_CAPSULE | Freq: Three times a day (TID) | ORAL | Status: DC
  Administered 2017-06-05 – 2017-06-06 (×2): 300 mg via ORAL
  Filled 2017-06-05 (×2): qty 1

## 2017-06-05 MED ORDER — SODIUM CHLORIDE 0.9 % IV SOLN: INTRAVENOUS | Status: DC

## 2017-06-05 MED ORDER — LIDOCAINE-EPINEPHRINE (PF) 2 %-1:200000 IJ SOLN
INTRAMUSCULAR | Status: AC
  Filled 2017-06-05: qty 20

## 2017-06-05 MED ORDER — DEXAMETHASONE SODIUM PHOSPHATE 4 MG/ML IJ SOLN
2.0000 mg | Freq: Three times a day (TID) | INTRAMUSCULAR | Status: DC
  Administered 2017-06-05 – 2017-06-06 (×2): 2 mg via INTRAVENOUS
  Filled 2017-06-05 (×2): qty 1

## 2017-06-05 MED ORDER — LAMOTRIGINE 100 MG PO TABS
200.0000 mg | ORAL_TABLET | Freq: Every day | ORAL | Status: DC
  Administered 2017-06-06: 200 mg via ORAL
  Filled 2017-06-05: qty 2

## 2017-06-05 MED ORDER — SUGAMMADEX SODIUM 200 MG/2ML IV SOLN
INTRAVENOUS | Status: AC
  Filled 2017-06-05: qty 2

## 2017-06-05 MED ORDER — ACETAMINOPHEN 10 MG/ML IV SOLN
INTRAVENOUS | Status: AC
  Filled 2017-06-05: qty 100

## 2017-06-05 MED ORDER — CHLORHEXIDINE GLUCONATE CLOTH 2 % EX PADS: 6.0000 | MEDICATED_PAD | Freq: Once | CUTANEOUS | Status: DC

## 2017-06-05 MED ORDER — BUPIVACAINE-EPINEPHRINE (PF) 0.5% -1:200000 IJ SOLN
INTRAMUSCULAR | Status: DC | PRN
  Administered 2017-06-05: 5 mL

## 2017-06-05 MED ORDER — ONDANSETRON HCL 4 MG/2ML IJ SOLN: 4.0000 mg | Freq: Four times a day (QID) | INTRAMUSCULAR | Status: DC | PRN

## 2017-06-05 MED ORDER — KETAMINE HCL-SODIUM CHLORIDE 100-0.9 MG/10ML-% IV SOSY
PREFILLED_SYRINGE | INTRAVENOUS | Status: AC
  Filled 2017-06-05: qty 10

## 2017-06-05 MED ORDER — SENNA 8.6 MG PO TABS
1.0000 | ORAL_TABLET | Freq: Two times a day (BID) | ORAL | Status: DC
  Administered 2017-06-05 – 2017-06-06 (×2): 8.6 mg via ORAL
  Filled 2017-06-05 (×2): qty 1

## 2017-06-05 MED ORDER — HYDROMORPHONE HCL 1 MG/ML IJ SOLN
INTRAMUSCULAR | Status: AC
  Filled 2017-06-05: qty 0.5

## 2017-06-05 MED ORDER — LABETALOL HCL 5 MG/ML IV SOLN
INTRAVENOUS | Status: AC
  Filled 2017-06-05: qty 4

## 2017-06-05 MED ORDER — FLEET ENEMA 7-19 GM/118ML RE ENEM: 1.0000 | ENEMA | Freq: Once | RECTAL | Status: DC | PRN

## 2017-06-05 MED ORDER — LACTATED RINGERS IV SOLN
INTRAVENOUS | Status: DC
  Administered 2017-06-05 (×3): via INTRAVENOUS

## 2017-06-05 MED ORDER — MIDAZOLAM HCL 5 MG/5ML IJ SOLN
INTRAMUSCULAR | Status: DC | PRN
  Administered 2017-06-05: 2 mg via INTRAVENOUS

## 2017-06-05 MED ORDER — THROMBIN 5000 UNITS EX SOLR
CUTANEOUS | Status: AC
  Filled 2017-06-05: qty 15000

## 2017-06-05 MED ORDER — AMPHETAMINE-DEXTROAMPHETAMINE 10 MG PO TABS
30.0000 mg | ORAL_TABLET | Freq: Two times a day (BID) | ORAL | Status: DC
  Administered 2017-06-05: 30 mg via ORAL
  Filled 2017-06-05: qty 3

## 2017-06-05 MED ORDER — LORATADINE 10 MG PO TABS
10.0000 mg | ORAL_TABLET | Freq: Every day | ORAL | Status: DC
  Administered 2017-06-06: 10 mg via ORAL
  Filled 2017-06-05: qty 1

## 2017-06-05 MED ORDER — HYDROMORPHONE HCL 1 MG/ML IJ SOLN
INTRAMUSCULAR | Status: DC | PRN
  Administered 2017-06-05: .3 mg via INTRAVENOUS
  Administered 2017-06-05: 0.5 mg via INTRAVENOUS
  Administered 2017-06-05: 1 mg via INTRAVENOUS
  Administered 2017-06-05: .2 mg via INTRAVENOUS

## 2017-06-05 MED ORDER — PROPOFOL 10 MG/ML IV BOLUS
INTRAVENOUS | Status: AC
  Filled 2017-06-05: qty 20

## 2017-06-05 MED ORDER — HYDROMORPHONE HCL 1 MG/ML IJ SOLN: 2.0000 mg | INTRAMUSCULAR | Status: DC

## 2017-06-05 MED ORDER — SUGAMMADEX SODIUM 200 MG/2ML IV SOLN
INTRAVENOUS | Status: DC | PRN
  Administered 2017-06-05: 140 mg via INTRAVENOUS

## 2017-06-05 MED ORDER — PHENYLEPHRINE HCL 10 MG/ML IJ SOLN
INTRAMUSCULAR | Status: DC | PRN
  Administered 2017-06-05: 80 ug via INTRAVENOUS

## 2017-06-05 MED ORDER — DIAZEPAM 5 MG PO TABS: 5.0000 mg | ORAL_TABLET | Freq: Four times a day (QID) | ORAL | Status: DC | PRN

## 2017-06-05 MED ORDER — ZOLPIDEM TARTRATE 5 MG PO TABS: 5.0000 mg | ORAL_TABLET | Freq: Every evening | ORAL | Status: DC | PRN

## 2017-06-05 MED ORDER — HYDROMORPHONE HCL 1 MG/ML IJ SOLN
INTRAMUSCULAR | Status: AC
Start: 2017-06-05 — End: 2017-06-06
  Filled 2017-06-05: qty 1

## 2017-06-05 MED ORDER — PROPOFOL 10 MG/ML IV BOLUS
INTRAVENOUS | Status: DC | PRN
  Administered 2017-06-05: 30 mg via INTRAVENOUS
  Administered 2017-06-05 (×2): 10 mg via INTRAVENOUS
  Administered 2017-06-05: 150 mg via INTRAVENOUS

## 2017-06-05 MED ORDER — CEFAZOLIN SODIUM-DEXTROSE 1-4 GM/50ML-% IV SOLN
1.0000 g | Freq: Three times a day (TID) | INTRAVENOUS | Status: AC
  Administered 2017-06-05 – 2017-06-06 (×2): 1 g via INTRAVENOUS
  Filled 2017-06-05 (×2): qty 50

## 2017-06-05 MED ORDER — ONDANSETRON HCL 4 MG PO TABS: 4.0000 mg | ORAL_TABLET | Freq: Four times a day (QID) | ORAL | Status: DC | PRN

## 2017-06-05 MED ORDER — SODIUM CHLORIDE 0.9% FLUSH: 3.0000 mL | Freq: Two times a day (BID) | INTRAVENOUS | Status: DC

## 2017-06-05 MED ORDER — ONDANSETRON HCL 4 MG/2ML IJ SOLN: 4.0000 mg | Freq: Once | INTRAMUSCULAR | Status: DC | PRN

## 2017-06-05 MED ORDER — HYDROMORPHONE HCL 1 MG/ML IJ SOLN
0.2500 mg | INTRAMUSCULAR | Status: DC | PRN
  Administered 2017-06-05: 0.5 mg via INTRAVENOUS

## 2017-06-05 MED ORDER — ACETAMINOPHEN 500 MG PO TABS
1000.0000 mg | ORAL_TABLET | Freq: Four times a day (QID) | ORAL | Status: DC
  Administered 2017-06-05 – 2017-06-06 (×3): 1000 mg via ORAL
  Filled 2017-06-05 (×3): qty 2

## 2017-06-05 MED ORDER — OXYCODONE HCL ER 20 MG PO T12A
20.0000 mg | EXTENDED_RELEASE_TABLET | Freq: Two times a day (BID) | ORAL | Status: DC
  Administered 2017-06-05 – 2017-06-06 (×2): 20 mg via ORAL
  Filled 2017-06-05 (×2): qty 1

## 2017-06-05 MED ORDER — MIDAZOLAM HCL 2 MG/2ML IJ SOLN
INTRAMUSCULAR | Status: AC
  Filled 2017-06-05: qty 2

## 2017-06-05 MED ORDER — FERROUS SULFATE 325 (65 FE) MG PO TABS
325.0000 mg | ORAL_TABLET | Freq: Every day | ORAL | Status: DC
  Administered 2017-06-06: 325 mg via ORAL
  Filled 2017-06-05: qty 1

## 2017-06-05 MED ORDER — CELECOXIB 200 MG PO CAPS
200.0000 mg | ORAL_CAPSULE | Freq: Two times a day (BID) | ORAL | Status: DC
  Administered 2017-06-05 – 2017-06-06 (×2): 200 mg via ORAL
  Filled 2017-06-05 (×2): qty 1

## 2017-06-05 MED ORDER — MENTHOL 3 MG MT LOZG: 1.0000 | LOZENGE | OROMUCOSAL | Status: DC | PRN

## 2017-06-05 MED ORDER — BUPIVACAINE-EPINEPHRINE (PF) 0.5% -1:200000 IJ SOLN
INTRAMUSCULAR | Status: AC
  Filled 2017-06-05: qty 30

## 2017-06-05 MED ORDER — FLUTICASONE PROPIONATE 50 MCG/ACT NA SUSP
1.0000 | Freq: Every day | NASAL | Status: DC
  Filled 2017-06-05: qty 16

## 2017-06-05 MED ORDER — SODIUM CHLORIDE 0.9 % IR SOLN
Status: DC | PRN
  Administered 2017-06-05: 15:00:00

## 2017-06-05 MED ORDER — NICOTINE 21 MG/24HR TD PT24
21.0000 mg | MEDICATED_PATCH | Freq: Every day | TRANSDERMAL | Status: DC
  Administered 2017-06-05: 21 mg via TRANSDERMAL
  Filled 2017-06-05: qty 1

## 2017-06-05 MED ORDER — LIDOCAINE HCL (CARDIAC) 20 MG/ML IV SOLN
INTRAVENOUS | Status: DC | PRN
  Administered 2017-06-05: 60 mg via INTRAVENOUS

## 2017-06-05 MED ORDER — OXYCODONE HCL 5 MG PO TABS
5.0000 mg | ORAL_TABLET | ORAL | Status: DC | PRN
  Administered 2017-06-05 – 2017-06-06 (×2): 10 mg via ORAL
  Filled 2017-06-05 (×2): qty 2

## 2017-06-05 MED ORDER — ACETAMINOPHEN 10 MG/ML IV SOLN
INTRAVENOUS | Status: DC | PRN
  Administered 2017-06-05: 1000 mg via INTRAVENOUS

## 2017-06-05 MED ORDER — GELATIN ABSORBABLE MT POWD
OROMUCOSAL | Status: DC | PRN
  Administered 2017-06-05: 15:00:00 via TOPICAL

## 2017-06-05 MED ORDER — DEXAMETHASONE SODIUM PHOSPHATE 10 MG/ML IJ SOLN
INTRAMUSCULAR | Status: DC | PRN
  Administered 2017-06-05: 10 mg via INTRAVENOUS

## 2017-06-05 MED ORDER — SODIUM CHLORIDE 0.9 % IJ SOLN
INTRAMUSCULAR | Status: AC
  Filled 2017-06-05: qty 10

## 2017-06-05 MED ORDER — HEMOSTATIC AGENTS (NO CHARGE) OPTIME
TOPICAL | Status: DC | PRN
  Administered 2017-06-05: 1 via TOPICAL

## 2017-06-05 MED ORDER — DOCUSATE SODIUM 100 MG PO CAPS
100.0000 mg | ORAL_CAPSULE | Freq: Two times a day (BID) | ORAL | Status: DC
  Administered 2017-06-05 – 2017-06-06 (×2): 100 mg via ORAL
  Filled 2017-06-05 (×2): qty 1

## 2017-06-05 MED ORDER — ALBUTEROL SULFATE (2.5 MG/3ML) 0.083% IN NEBU: 3.0000 mL | INHALATION_SOLUTION | RESPIRATORY_TRACT | Status: DC | PRN

## 2017-06-05 MED ORDER — PHENYLEPHRINE HCL 10 MG/ML IJ SOLN
INTRAVENOUS | Status: DC | PRN
  Administered 2017-06-05: 20 ug/min via INTRAVENOUS

## 2017-06-05 MED ORDER — PHENYLEPHRINE 40 MCG/ML (10ML) SYRINGE FOR IV PUSH (FOR BLOOD PRESSURE SUPPORT)
PREFILLED_SYRINGE | INTRAVENOUS | Status: AC
  Filled 2017-06-05: qty 10

## 2017-06-05 MED ORDER — METHOCARBAMOL 750 MG PO TABS
750.0000 mg | ORAL_TABLET | Freq: Four times a day (QID) | ORAL | Status: DC
  Filled 2017-06-05: qty 1

## 2017-06-05 MED ORDER — SODIUM CHLORIDE 0.9% FLUSH: 3.0000 mL | INTRAVENOUS | Status: DC | PRN

## 2017-06-05 MED ORDER — 0.9 % SODIUM CHLORIDE (POUR BTL) OPTIME
TOPICAL | Status: DC | PRN
  Administered 2017-06-05: 1000 mL

## 2017-06-05 SURGICAL SUPPLY — 68 items
ADH SKN CLS APL DERMABOND .7 (GAUZE/BANDAGES/DRESSINGS) ×1
BIT DRILL NEURO 2X3.1 SFT TUCH (MISCELLANEOUS) ×1 IMPLANT
BLADE SURG 11 STRL SS (BLADE) ×3 IMPLANT
BLADE ULTRA TIP 2M (BLADE) IMPLANT
BUR MATCHSTICK NEURO 3.0 LAGG (BURR) ×3 IMPLANT
CANISTER SUCT 3000ML PPV (MISCELLANEOUS) ×3 IMPLANT
CARTRIDGE OIL MAESTRO DRILL (MISCELLANEOUS) ×1 IMPLANT
CHLORAPREP W/TINT 26ML (MISCELLANEOUS) ×3 IMPLANT
DECANTER SPIKE VIAL GLASS SM (MISCELLANEOUS) ×3 IMPLANT
DERMABOND ADVANCED (GAUZE/BANDAGES/DRESSINGS) ×2
DERMABOND ADVANCED .7 DNX12 (GAUZE/BANDAGES/DRESSINGS) ×1 IMPLANT
DIFFUSER DRILL AIR PNEUMATIC (MISCELLANEOUS) ×3 IMPLANT
DISC MOBI-C CERVICAL 13X15 H5 (Miscellaneous) ×2 IMPLANT
DRAPE C-ARM 42X72 X-RAY (DRAPES) ×6 IMPLANT
DRAPE HALF SHEET 40X57 (DRAPES) IMPLANT
DRAPE LAPAROTOMY 100X72 PEDS (DRAPES) ×3 IMPLANT
DRAPE MICROSCOPE LEICA (MISCELLANEOUS) IMPLANT
DRAPE POUCH INSTRU U-SHP 10X18 (DRAPES) ×3 IMPLANT
DRAPE SHEET LG 3/4 BI-LAMINATE (DRAPES) IMPLANT
DRILL NEURO 2X3.1 SOFT TOUCH (MISCELLANEOUS) ×3
DRSG OPSITE POSTOP 4X6 (GAUZE/BANDAGES/DRESSINGS) ×2 IMPLANT
ELECT COATED BLADE 2.86 ST (ELECTRODE) ×6 IMPLANT
ELECT REM PT RETURN 9FT ADLT (ELECTROSURGICAL) ×3
ELECTRODE REM PT RTRN 9FT ADLT (ELECTROSURGICAL) ×1 IMPLANT
EVACUATOR 1/8 PVC DRAIN (DRAIN) IMPLANT
GAUZE SPONGE 4X4 12PLY STRL (GAUZE/BANDAGES/DRESSINGS) IMPLANT
GAUZE SPONGE 4X4 16PLY XRAY LF (GAUZE/BANDAGES/DRESSINGS) IMPLANT
GLOVE BIO SURGEON STRL SZ7 (GLOVE) IMPLANT
GLOVE BIO SURGEON STRL SZ7.5 (GLOVE) ×2 IMPLANT
GLOVE BIOGEL PI IND STRL 7.0 (GLOVE) IMPLANT
GLOVE BIOGEL PI IND STRL 7.5 (GLOVE) ×1 IMPLANT
GLOVE BIOGEL PI INDICATOR 7.0 (GLOVE)
GLOVE BIOGEL PI INDICATOR 7.5 (GLOVE) ×4
GLOVE ECLIPSE 9.0 STRL (GLOVE) ×2 IMPLANT
GLOVE SS BIOGEL STRL SZ 7.5 (GLOVE) IMPLANT
GLOVE SUPERSENSE BIOGEL SZ 7.5 (GLOVE) ×2
GOWN STRL REUS W/ TWL LRG LVL3 (GOWN DISPOSABLE) ×1 IMPLANT
GOWN STRL REUS W/ TWL XL LVL3 (GOWN DISPOSABLE) ×1 IMPLANT
GOWN STRL REUS W/TWL LRG LVL3 (GOWN DISPOSABLE) ×3
GOWN STRL REUS W/TWL XL LVL3 (GOWN DISPOSABLE) ×3
HEMOSTAT POWDER KIT SURGIFOAM (HEMOSTASIS) ×3 IMPLANT
KIT BASIN OR (CUSTOM PROCEDURE TRAY) ×3 IMPLANT
KIT ROOM TURNOVER OR (KITS) ×3 IMPLANT
NDL HYPO 21X1.5 SAFETY (NEEDLE) ×1 IMPLANT
NDL SPNL 22GX3.5 QUINCKE BK (NEEDLE) ×1 IMPLANT
NEEDLE HYPO 21X1.5 SAFETY (NEEDLE) ×3 IMPLANT
NEEDLE SPNL 22GX3.5 QUINCKE BK (NEEDLE) ×3 IMPLANT
NS IRRIG 1000ML POUR BTL (IV SOLUTION) ×3 IMPLANT
OIL CARTRIDGE MAESTRO DRILL (MISCELLANEOUS) ×3
PACK LAMINECTOMY NEURO (CUSTOM PROCEDURE TRAY) ×3 IMPLANT
PAD ARMBOARD 7.5X6 YLW CONV (MISCELLANEOUS) ×3 IMPLANT
PATTIES SURGICAL .5X1.5 (GAUZE/BANDAGES/DRESSINGS) ×3 IMPLANT
PIN CASPAR SPINAL 12MM (Screw) ×4 IMPLANT
RUBBERBAND STERILE (MISCELLANEOUS) IMPLANT
SPONGE INTESTINAL PEANUT (DISPOSABLE) ×3 IMPLANT
SPONGE SURGIFOAM ABS GEL SZ50 (HEMOSTASIS) IMPLANT
STAPLER VISISTAT 35W (STAPLE) ×3 IMPLANT
STOCKINETTE 6  STRL (DRAPES) ×2
STOCKINETTE 6 STRL (DRAPES) ×1 IMPLANT
SUT STRATAFIX MNCRL+ 3-0 PS-2 (SUTURE) ×1
SUT STRATAFIX MONOCRYL 3-0 (SUTURE) ×2
SUT VIC AB 3-0 SH 8-18 (SUTURE) ×3 IMPLANT
SUTURE STRATFX MNCRL+ 3-0 PS-2 (SUTURE) ×1 IMPLANT
TOWEL GREEN STERILE (TOWEL DISPOSABLE) ×3 IMPLANT
TOWEL GREEN STERILE FF (TOWEL DISPOSABLE) ×6 IMPLANT
TUBE CONNECTING 12'X1/4 (SUCTIONS) ×1
TUBE CONNECTING 12X1/4 (SUCTIONS) ×2 IMPLANT
WATER STERILE IRR 1000ML POUR (IV SOLUTION) ×3 IMPLANT

## 2017-06-05 NOTE — Anesthesia Procedure Notes (Signed)
Arterial Line Insertion Start/End9/18/2018 2:29 PM Performed by: Dorie Rank, CRNA  Lidocaine 1% used for infiltration Right, radial was placed Catheter size: 20 G Hand hygiene performed , maximum sterile barriers used  and Seldinger technique used Allen's test indicative of satisfactory collateral circulation Attempts: 1 (attempted on other side x 2) Following insertion, Biopatch and dressing applied. Post procedure assessment: normal

## 2017-06-05 NOTE — Progress Notes (Signed)
Orthopedic Tech Progress Note Patient Details:  Nancy Bowers 11-08-1975 [id number]  Ortho Devices Type of Ortho Device: Soft collar Ortho Device/Splint Location: neck Ortho Device/Splint Interventions: Ordered, Application   Jennye Moccasin 06/05/2017, 9:46 PM

## 2017-06-05 NOTE — Brief Op Note (Signed)
06/05/2017  3:42 PM  PATIENT:  Nancy Bowers  41 y.o. female  PRE-OPERATIVE DIAGNOSIS:  Cervical spondylosis with myelopathy  POST-OPERATIVE DIAGNOSIS:  Cervical spondylosis with myelopathy  PROCEDURE:  Procedure(s) with comments: Cervical Four - Five Cervical arthroplasty (N/A) - C4-5 Cervical arthroplasty  SURGEON:  Surgeon(s) and Role:    * Ditty, Loura Halt, MD - Primary    * Julio Sicks, MD - Assiting  PHYSICIAN ASSISTANT: Cindra Presume, PA-C  ANESTHESIA:   general  EBL:  Total I/O In: 1000 [I.V.:1000] Out: 100 [Blood:100]  BLOOD ADMINISTERED:none  DRAINS: none   LOCAL MEDICATIONS USED:  LIDOCAINE   SPECIMEN:  No Specimen  DISPOSITION OF SPECIMEN:  N/A  COUNTS:  YES  TOURNIQUET:  * No tourniquets in log *  DICTATION: .Note written in EPIC  PLAN OF CARE: Admit to inpatient   PATIENT DISPOSITION:  PACU - hemodynamically stable.   Delay start of Pharmacological VTE agent (>24hrs) due to surgical blood loss or risk of bleeding: yes
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
## 2017-06-05 NOTE — Transfer of Care (Signed)
Immediate Anesthesia Transfer of Care Note  Patient: Nancy Bowers  Procedure(s) Performed: Procedure(s) with comments: Cervical Four - Five Cervical arthroplasty (N/A) - C4-5 Cervical arthroplasty  Patient Location: PACU  Anesthesia Type:General  Level of Consciousness: awake and alert   Airway & Oxygen Therapy: Patient Spontanous Breathing and Patient connected to face mask oxygen  Post-op Assessment: Report given to RN, Post -op Vital signs reviewed and stable and Patient moving all extremities X 4  Post vital signs: Reviewed and stable  Last Vitals:  Vitals:   06/05/17 1031  BP: (!) 149/90  Pulse: 86  Resp: 18  Temp: 36.6 C  SpO2: 99%    Last Pain:  Vitals:   06/05/17 1054  TempSrc:   PainSc: 10-Worst pain ever         Complications: No apparent anesthesia complications

## 2017-06-05 NOTE — Anesthesia Procedure Notes (Addendum)
Procedure Name: Intubation Date/Time: 06/05/2017 2:16 PM Performed by: Orlinda Blalock, Kamaury Cutbirth L Pre-anesthesia Checklist: Patient identified, Emergency Drugs available, Suction available and Patient being monitored Patient Re-evaluated:Patient Re-evaluated prior to induction Oxygen Delivery Method: Circle System Utilized Preoxygenation: Pre-oxygenation with 100% oxygen Induction Type: IV induction Ventilation: Mask ventilation without difficulty Laryngoscope Size: McGraph Grade View: Grade I Tube type: Oral Tube size: 7.0 mm Number of attempts: 1 Airway Equipment and Method: Stylet,  Video-laryngoscopy and Bite block Placement Confirmation: ETT inserted through vocal cords under direct vision,  positive ETCO2 and breath sounds checked- equal and bilateral Secured at: 21 cm Tube secured with: Tape Dental Injury: Teeth and Oropharynx as per pre-operative assessment  Comments: Difficult to guide et tip through cords despite clear view due to pt small mouth and need to maintain neutral positioning

## 2017-06-05 NOTE — Anesthesia Preprocedure Evaluation (Addendum)
Anesthesia Evaluation  Patient identified by MRN, date of birth, ID band Patient awake    Reviewed: Allergy & Precautions, NPO status , Patient's Chart, lab work & pertinent test results  Airway Mallampati: II  TM Distance: >3 FB Neck ROM: Full    Dental  (+) Teeth Intact, Dental Advisory Given   Pulmonary Current Smoker,    breath sounds clear to auscultation       Cardiovascular  Rhythm:Regular Rate:Normal     Neuro/Psych    GI/Hepatic   Endo/Other  diabetes  Renal/GU      Musculoskeletal   Abdominal   Peds  Hematology   Anesthesia Other Findings   Reproductive/Obstetrics                             Anesthesia Physical Anesthesia Plan  ASA: II  Anesthesia Plan: General   Post-op Pain Management:    Induction: Intravenous  PONV Risk Score and Plan: 2 and Ondansetron and Dexamethasone  Airway Management Planned: Oral ETT  Additional Equipment:   Intra-op Plan:   Post-operative Plan: Extubation in OR  Informed Consent: I have reviewed the patients History and Physical, chart, labs and discussed the procedure including the risks, benefits and alternatives for the proposed anesthesia with the patient or authorized representative who has indicated his/her understanding and acceptance.   Dental advisory given  Plan Discussed with: CRNA and Anesthesiologist  Anesthesia Plan Comments:         Anesthesia Quick Evaluation

## 2017-06-06 ENCOUNTER — Other Ambulatory Visit (HOSPITAL_COMMUNITY): Payer: Self-pay | Admitting: Psychiatry

## 2017-06-06 ENCOUNTER — Encounter (HOSPITAL_COMMUNITY): Payer: Self-pay | Admitting: Neurological Surgery

## 2017-06-06 DIAGNOSIS — M4712 Other spondylosis with myelopathy, cervical region: Secondary | ICD-10-CM | POA: Diagnosis not present

## 2017-06-06 DIAGNOSIS — F319 Bipolar disorder, unspecified: Secondary | ICD-10-CM

## 2017-06-06 MED ORDER — OXYCODONE-ACETAMINOPHEN 10-325 MG PO TABS: 1.0000 | ORAL_TABLET | ORAL | 0 refills | Status: DC | PRN

## 2017-06-06 MED ORDER — METHOCARBAMOL 750 MG PO TABS: 750.0000 mg | ORAL_TABLET | Freq: Four times a day (QID) | ORAL | 2 refills | Status: DC | PRN

## 2017-06-06 MED ORDER — GABAPENTIN 300 MG PO CAPS: 300.0000 mg | ORAL_CAPSULE | Freq: Three times a day (TID) | ORAL | 2 refills | Status: DC

## 2017-06-06 NOTE — Progress Notes (Signed)
Patient alert and oriented, mae's well, voiding adequate amount of urine, swallowing without difficulty, c/o mild pain at time of discharge. Patient discharged home with family. Script and discharged instructions given to patient. Patient and family stated understanding of instructions given. Patient has an appointment with Dr. Bevely Palmer

## 2017-06-06 NOTE — Discharge Summary (Signed)
Date of Admission: 06/05/2017  Date of Discharge: 06/06/17  Admission Diagnosis: Cervical spondylosis with myelopathy; cervical disc herniation C4-5  Discharge Diagnosis: Same  Procedure Performed: C4-5 discectomy with arthroplasty  Attending: Denell Cothern, Loura Halt, MD  Hospital Course:  The patient was admitted for the above listed operation and had an uncomplicated post-operative course.  They were discharged in improved condition.  Follow up: 3 weeks  Allergies as of 06/06/2017      Reactions   Aspirin Swelling   thoart   Fentanyl Rash   Bee Venom Hives   Tylenol [acetaminophen] Swelling   Tolerates percocet and norco      Medication List    STOP taking these medications   cyclobenzaprine 5 MG tablet Commonly known as:  FLEXERIL   tiZANidine 4 MG tablet Commonly known as:  ZANAFLEX     TAKE these medications   albuterol 108 (90 Base) MCG/ACT inhaler Commonly known as:  PROVENTIL HFA;VENTOLIN HFA Inhale 1-2 puffs into the lungs every 4 (four) hours as needed for wheezing or shortness of breath.   amphetamine-dextroamphetamine 30 MG tablet Commonly known as:  ADDERALL Take 1 tablet by mouth 2 (two) times daily.   cetirizine 10 MG tablet Commonly known as:  ZYRTEC Take 10 mg by mouth daily.   clonazePAM 0.5 MG tablet Commonly known as:  KLONOPIN Take 1 tablet (0.5 mg total) by mouth 3 (three) times daily as needed for anxiety.   diclofenac 75 MG EC tablet Commonly known as:  VOLTAREN Take 75 mg by mouth 2 (two) times daily.   ferrous sulfate 325 (65 FE) MG tablet Take 325 mg by mouth daily with breakfast.   fluticasone 50 MCG/ACT nasal spray Commonly known as:  FLONASE Place 1 spray into both nostrils daily.   gabapentin 300 MG capsule Commonly known as:  NEURONTIN Take 1 capsule (300 mg total) by mouth 3 (three) times daily.   ibuprofen 800 MG tablet Commonly known as:  ADVIL,MOTRIN Take 800 mg by mouth every 8 (eight) hours as needed. Out of    lamoTRIgine 200 MG tablet Commonly known as:  LAMICTAL Take 1 tablet (200 mg total) by mouth daily.   lidocaine 4 % cream Commonly known as:  LMX Apply 1 application topically daily as needed (pain relief).   Lurasidone HCl 120 MG Tabs Take 1 tablet (120 mg total) by mouth at bedtime.   methocarbamol 750 MG tablet Commonly known as:  ROBAXIN Take 750 mg by mouth every 8 (eight) hours as needed for muscle spasms. What changed:  Another medication with the same name was added. Make sure you understand how and when to take each.   methocarbamol 750 MG tablet Commonly known as:  ROBAXIN Take 1 tablet (750 mg total) by mouth every 6 (six) hours as needed for muscle spasms. What changed:  You were already taking a medication with the same name, and this prescription was added. Make sure you understand how and when to take each.   oxyCODONE-acetaminophen 10-325 MG tablet Commonly known as:  PERCOCET Take 1-2 tablets by mouth every 4 (four) hours as needed for pain. Reported on 10/26/2015   traMADol 50 MG tablet Commonly known as:  ULTRAM Take 2 tablets (100 mg total) by mouth every 12 (twelve) hours as needed.            Durable Medical Equipment        Start     Ordered   06/06/17 1129  For home use only DME 3  n 1  Once     06/06/17 1128   06/06/17 1128  For home use only DME Walker rolling  Once    Question:  Patient needs a walker to treat with the following condition  Answer:  Unsteady gait   06/06/17 1128       Discharge Care Instructions        Start     Ordered   06/06/17 0000  gabapentin (NEURONTIN) 300 MG capsule  3 times daily     06/06/17 1129   06/06/17 0000  methocarbamol (ROBAXIN) 750 MG tablet  Every 6 hours PRN     06/06/17 1129   06/06/17 0000  oxyCODONE-acetaminophen (PERCOCET) 10-325 MG tablet  Every 4 hours PRN     06/06/17 1129   06/06/17 0000  Discharge instructions    Comments:  Do not lift more than 10 pounds Keep incision clean and dry    06/06/17 1129   06/06/17 0000  Diet - low sodium heart healthy     06/06/17 1129   06/06/17 0000  Increase activity slowly     06/06/17 1129   06/06/17 0000  Lifting restrictions    Comments:  Do not lift more than ten pounds   06/06/17 1129   06/06/17 0000  Call MD for:  temperature >100.4     06/06/17 1129   06/06/17 0000  Call MD for:  persistant nausea and vomiting     06/06/17 1129   06/06/17 0000  Call MD for:  severe uncontrolled pain     06/06/17 1129   06/06/17 0000  Call MD for:  redness, tenderness, or signs of infection (pain, swelling, redness, odor or green/yellow discharge around incision site)     06/06/17 1129   06/06/17 0000  Call MD for:  difficulty breathing, headache or visual disturbances     06/06/17 1129   06/06/17 0000  Call MD for:  persistant dizziness or light-headedness     06/06/17 1129

## 2017-06-06 NOTE — Evaluation (Signed)
Occupational Therapy Evaluation Patient Details Name: Nancy Bowers MRN: [id number] DOB: 29-Jul-1976 Today's Date: 06/06/2017    History of Present Illness Pt is a 41 y.o. female s/p C4-5 Cervical arthroplasty. PMHx: Anxiety, Bipolar, ADD, Back pain, COPD, DM, OCD.    Clinical Impression   Pt reports prior to her fall she was independent with ADL; since fall with injury her husband has been assisting with all ADL and mobility. Pt reports hx of 100+ falls in the past few months. Currently pt requires close min guard assist for short distance functional mobility in room. Pt requires min guard-min assist for ADL. Educated pt on cervical precautions, ADL technique, and home safety. Pt planning to d/c home with 24/7 supervision from her husband. Recommending HHOT for follow up to maximize independence and safety with ADL and functional mobility upon return home. Pt would benefit from continued skilled OT to address established goals.    Follow Up Recommendations  Home health OT;Supervision/Assistance - 24 hour    Equipment Recommendations  3 in 1 bedside commode    Recommendations for Other Services       Precautions / Restrictions Precautions Precautions: Cervical;Fall Precaution Comments: Educated pt and husband on cervical precautions Required Braces or Orthoses: Cervical Brace Cervical Brace: Soft collar Restrictions Weight Bearing Restrictions: No      Mobility Bed Mobility               General bed mobility comments: Pt OOB in chair upon arrival.  Transfers Overall transfer level: Needs assistance Equipment used: None Transfers: Sit to/from Stand Sit to Stand: Min guard         General transfer comment: Pt unsteady but no major LOB with sit to stand, close min guard for safety    Balance Overall balance assessment: Needs assistance;History of Falls Sitting-balance support: Feet supported;No upper extremity supported Sitting balance-Leahy Scale: Good      Standing balance support: During functional activity;No upper extremity supported Standing balance-Leahy Scale: Fair                             ADL either performed or assessed with clinical judgement   ADL Overall ADL's : Needs assistance/impaired Eating/Feeding: Set up;Sitting Eating/Feeding Details (indicate cue type and reason): pt able to self feed breakfast Grooming: Min guard;Standing Grooming Details (indicate cue type and reason): Educated on use of cups for oral care Upper Body Bathing: Minimal assistance;Sitting   Lower Body Bathing: Min guard;Sit to/from stand   Upper Body Dressing : Minimal assistance;Sitting   Lower Body Dressing: Min guard;Sit to/from stand   Toilet Transfer: Min guard;Ambulation;BSC       Tub/ Shower Transfer: Min guard;Tub transfer;Ambulation;3 in 1 Tub/Shower Transfer Details (indicate cue type and reason): simulated in room. discussed use of 3 in 1 in tub as a seat for safety. no sitting in bottom of tub for bathing Functional mobility during ADLs: Min guard       Vision         Perception     Praxis      Pertinent Vitals/Pain Pain Assessment: Faces Faces Pain Scale: Hurts little more Pain Location: neck Pain Descriptors / Indicators: Sore Pain Intervention(s): Monitored during session;Repositioned     Hand Dominance Right   Extremity/Trunk Assessment Upper Extremity Assessment Upper Extremity Assessment: RUE deficits/detail;LUE deficits/detail RUE Deficits / Details: Grossly 3+/5 overall. Reports numbness/tingling throughout. RUE Sensation: decreased light touch RUE Coordination: decreased gross motor;decreased fine motor LUE Deficits /  Details: Grossly 3+/5 overall. Reports numbness/tingling throughout. LUE Sensation: decreased light touch LUE Coordination: decreased fine motor;decreased gross motor   Lower Extremity Assessment Lower Extremity Assessment: Defer to PT evaluation   Cervical / Trunk  Assessment Cervical / Trunk Assessment: Other exceptions Cervical / Trunk Exceptions: s/p cervical sx   Communication Communication Communication: No difficulties   Cognition Arousal/Alertness: Awake/alert Behavior During Therapy: WFL for tasks assessed/performed Overall Cognitive Status: Within Functional Limits for tasks assessed                                     General Comments       Exercises     Shoulder Instructions      Home Living Family/patient expects to be discharged to:: Private residence Living Arrangements: Spouse/significant other Available Help at Discharge: Family;Available 24 hours/day Type of Home: Mobile home Home Access: Stairs to enter Entrance Stairs-Number of Steps: 3 Entrance Stairs-Rails: None Home Layout: One level     Bathroom Shower/Tub: IT trainer: Standard     Home Equipment: None          Prior Functioning/Environment Level of Independence: Needs assistance  Gait / Transfers Assistance Needed: Husband assisting with mobility for the past few months since fall; prior to fall she was independent ADL's / Homemaking Assistance Needed: Husband assisting with all ADL. Pt sitting in bottom of tub for bathing.            OT Problem List: Decreased strength;Decreased range of motion;Impaired balance (sitting and/or standing);Decreased coordination;Decreased knowledge of use of DME or AE;Decreased knowledge of precautions;Impaired sensation;Impaired UE functional use;Pain      OT Treatment/Interventions: Self-care/ADL training;Therapeutic exercise;DME and/or AE instruction;Therapeutic activities;Patient/family education;Balance training    OT Goals(Current goals can be found in the care plan section) Acute Rehab OT Goals Patient Stated Goal: back to being independent OT Goal Formulation: With patient/family Time For Goal Achievement: 06/20/17 Potential to Achieve Goals: Good ADL  Goals Pt Will Perform Tub/Shower Transfer: (P) with supervision;Tub transfer;ambulating;3 in 1;rolling walker Pt/caregiver will Perform Home Exercise Program: Increased strength;Right Upper extremity;Left upper extremity;With theraputty;Independently;With written HEP provided (increase fine motor coordination) Additional ADL Goal #1: Pt will independently verbally recall cervical precautions and maintain throughout ADL.  OT Frequency: Min 2X/week   Barriers to D/C:            Co-evaluation              AM-PAC PT "6 Clicks" Daily Activity     Outcome Measure Help from another person eating meals?: A Little Help from another person taking care of personal grooming?: A Little Help from another person toileting, which includes using toliet, bedpan, or urinal?: A Little Help from another person bathing (including washing, rinsing, drying)?: A Little Help from another person to put on and taking off regular upper body clothing?: A Little Help from another person to put on and taking off regular lower body clothing?: A Little 6 Click Score: 18   End of Session Equipment Utilized During Treatment: Cervical collar Nurse Communication: Mobility status;Other (comment) (f/u and equipment needs)  Activity Tolerance: Patient tolerated treatment well Patient left: Other (comment) (standing in hallway with PT)  OT Visit Diagnosis: Unsteadiness on feet (R26.81);Repeated falls (R29.6);Muscle weakness (generalized) (M62.81);Pain Pain - part of body:  (neck)                Time: 1610-9604 OT  Time Calculation (min): 21 min Charges:  OT General Charges $OT Visit: 1 Visit OT Evaluation $OT Eval Moderate Complexity: 1 Mod G-Codes: OT G-codes **NOT FOR INPATIENT CLASS** Functional Assessment Tool Used: Clinical judgement Functional Limitation: Self care Self Care Current Status (N5621): At least 20 percent but less than 40 percent impaired, limited or restricted Self Care Goal Status (H0865):  At least 1 percent but less than 20 percent impaired, limited or restricted   Fredric Mare A. Brett Albino, M.S., OTR/L Pager: [phone]  Gaye Alken 06/06/2017, 10:01 AM

## 2017-06-06 NOTE — Progress Notes (Signed)
No acute events Hand grip improved Incision c/d/i D/c

## 2017-06-06 NOTE — Evaluation (Signed)
Physical Therapy Evaluation Patient Details Name: Nancy Bowers MRN: [id number] DOB: Dec 02, 1975 Today's Date: 06/06/2017   History of Present Illness  Pt is a 41 y.o. female s/p C4-5 Cervical arthroplasty. PMHx: Anxiety, Bipolar, ADD, Back pain, COPD, DM, OCD.   Clinical Impression  Orders received for PT evaluation. Patient demonstrates deficits in functional mobility as indicated below. Will benefit from continued skilled PT to address deficits and maximize function. Will see as indicated and progress as tolerated.      Follow Up Recommendations Home health PT;Supervision/Assistance - 24 hour    Equipment Recommendations  Rolling walker with 5" wheels    Recommendations for Other Services       Precautions / Restrictions Precautions Precautions: Cervical;Fall Precaution Comments: Educated pt and husband on cervical precautions Required Braces or Orthoses: Cervical Brace Cervical Brace: Soft collar Restrictions Weight Bearing Restrictions: No      Mobility  Bed Mobility               General bed mobility comments: Pt OOB in chair upon arrival.  Transfers Overall transfer level: Needs assistance Equipment used: None Transfers: Sit to/from Stand Sit to Stand: Min guard         General transfer comment: Pt unsteady but no major LOB with sit to stand, close min guard for safety  Ambulation/Gait Ambulation/Gait assistance: Min assist Ambulation Distance (Feet): 90 Feet Assistive device: 1 person hand held assist Gait Pattern/deviations: Step-through pattern;Ataxic;Shuffle;Narrow base of support;Drifts right/left Gait velocity: decreased Gait velocity interpretation: Below normal speed for age/gender General Gait Details: patient with moted instability during ambulation, required HHA for stability at all times  Stairs Stairs: Yes Stairs assistance: Mod assist Stair Management: Step to pattern;No rails Number of Stairs: 4 General stair comments: hand held  assist with wrap around performed x2 with significatn other providing assist  Wheelchair Mobility    Modified Rankin (Stroke Patients Only)       Balance Overall balance assessment: Needs assistance;History of Falls Sitting-balance support: Feet supported;No upper extremity supported Sitting balance-Leahy Scale: Good     Standing balance support: During functional activity;No upper extremity supported Standing balance-Leahy Scale: Fair                               Pertinent Vitals/Pain Pain Assessment: Faces Faces Pain Scale: Hurts little more Pain Location: neck Pain Descriptors / Indicators: Sore Pain Intervention(s): Monitored during session;Repositioned    Home Living Family/patient expects to be discharged to:: Private residence Living Arrangements: Spouse/significant other Available Help at Discharge: Family;Available 24 hours/day Type of Home: Mobile home Home Access: Stairs to enter Entrance Stairs-Rails: None Entrance Stairs-Number of Steps: 3 Home Layout: One level Home Equipment: None      Prior Function Level of Independence: Needs assistance   Gait / Transfers Assistance Needed: Husband assisting with mobility for the past few months since fall; prior to fall she was independent  ADL's / Homemaking Assistance Needed: Husband assisting with all ADL. Pt sitting in bottom of tub for bathing.        Hand Dominance   Dominant Hand: Right    Extremity/Trunk Assessment   Upper Extremity Assessment Upper Extremity Assessment: Defer to OT evaluation RUE Deficits / Details: Grossly 3+/5 overall. Reports numbness/tingling throughout. RUE Sensation: decreased light touch RUE Coordination: decreased gross motor;decreased fine motor LUE Deficits / Details: Grossly 3+/5 overall. Reports numbness/tingling throughout. LUE Sensation: decreased light touch LUE Coordination: decreased fine motor;decreased gross motor  Lower Extremity  Assessment Lower Extremity Assessment: Generalized weakness;RLE deficits/detail;LLE deficits/detail RLE Deficits / Details: decreased dorsiflexion noted RLE otherwise 4/5 strength RLE Sensation: decreased light touch RLE Coordination: decreased fine motor;decreased gross motor LLE Deficits / Details: 4/5 strength gross motions LLE Coordination: decreased fine motor;decreased gross motor    Cervical / Trunk Assessment Cervical / Trunk Assessment: Other exceptions Cervical / Trunk Exceptions: s/p cervical sx  Communication   Communication: No difficulties  Cognition Arousal/Alertness: Awake/alert Behavior During Therapy: WFL for tasks assessed/performed Overall Cognitive Status: Within Functional Limits for tasks assessed                                        General Comments      Exercises     Assessment/Plan    PT Assessment Patient needs continued PT services  PT Problem List Decreased strength;Decreased activity tolerance;Decreased balance;Decreased mobility;Decreased coordination;Decreased cognition       PT Treatment Interventions DME instruction;Gait training;Stair training;Functional mobility training;Therapeutic activities;Therapeutic exercise;Balance training;Patient/family education    PT Goals (Current goals can be found in the Care Plan section)  Acute Rehab PT Goals Patient Stated Goal: to not fall anymore PT Goal Formulation: With patient/family Time For Goal Achievement: 06/20/17 Potential to Achieve Goals: Good    Frequency Min 5X/week   Barriers to discharge        Co-evaluation               AM-PAC PT "6 Clicks" Daily Activity  Outcome Measure Difficulty turning over in bed (including adjusting bedclothes, sheets and blankets)?: Unable Difficulty moving from lying on back to sitting on the side of the bed? : Unable Difficulty sitting down on and standing up from a chair with arms (e.g., wheelchair, bedside commode, etc,.)?:  Unable Help needed moving to and from a bed to chair (including a wheelchair)?: A Little Help needed walking in hospital room?: A Little Help needed climbing 3-5 steps with a railing? : A Lot 6 Click Score: 11    End of Session Equipment Utilized During Treatment: Gait belt;Cervical collar Activity Tolerance: Patient limited by pain Patient left: in chair;with call bell/phone within reach;with family/visitor present Nurse Communication: Mobility status PT Visit Diagnosis: Unsteadiness on feet (R26.81);Repeated falls (R29.6);Muscle weakness (generalized) (M62.81)    Time: 1610-9604 PT Time Calculation (min) (ACUTE ONLY): 16 min   Charges:   PT Evaluation $PT Eval Moderate Complexity: 1 Mod     PT G Codes:   PT G-Codes **NOT FOR INPATIENT CLASS** Functional Assessment Tool Used: Clinical judgement Functional Limitation: Mobility: Walking and moving around Mobility: Walking and Moving Around Current Status (V4098): At least 20 percent but less than 40 percent impaired, limited or restricted Mobility: Walking and Moving Around Goal Status [phone]): At least 1 percent but less than 20 percent impaired, limited or restricted    Charlotte Crumb, PT DPT  Board Certified Neurologic Specialist [phone]   Fabio Asa 06/06/2017, 10:55 AM

## 2017-06-07 NOTE — Anesthesia Postprocedure Evaluation (Signed)
Anesthesia Post Note  Patient: Nancy Bowers  Procedure(s) Performed: Procedure(s) (LRB): Cervical Four - Five Cervical arthroplasty (N/A)     Patient location during evaluation: PACU Anesthesia Type: General Level of consciousness: awake and alert Pain management: pain level controlled Vital Signs Assessment: post-procedure vital signs reviewed and stable Respiratory status: spontaneous breathing, nonlabored ventilation, respiratory function stable and patient connected to nasal cannula oxygen Cardiovascular status: stable Postop Assessment: no apparent nausea or vomiting Anesthetic complications: no    Last Vitals:  Vitals:   06/06/17 0355 06/06/17 0700  BP: 133/86 (!) 147/93  Pulse: (!) 104 96  Resp: 16 16  Temp: 36.7 C 36.8 C  SpO2: 96% 95%    Last Pain:  Vitals:   06/06/17 0706  TempSrc:   PainSc: 2                  Edgel Degnan

## 2017-06-09 ENCOUNTER — Ambulatory Visit
Admission: RE | Admit: 2017-06-09 | Discharge: 2017-06-09 | Disposition: A | Discharge status: Home / Self Care | Payer: Medicaid Other | Source: Ambulatory Visit | Attending: Orthopaedic Surgery | Admitting: Orthopaedic Surgery

## 2017-06-09 ENCOUNTER — Ambulatory Visit
Admission: RE | Admit: 2017-06-09 | Discharge: 2017-06-09 | Disposition: A | Discharge status: Home / Self Care | Payer: Medicaid Other | Source: Ambulatory Visit | Attending: Orthopedic Surgery | Admitting: Orthopedic Surgery

## 2017-06-09 DIAGNOSIS — M25562 Pain in left knee: Principal | ICD-10-CM

## 2017-06-09 DIAGNOSIS — M25561 Pain in right knee: Secondary | ICD-10-CM

## 2017-06-09 DIAGNOSIS — G8929 Other chronic pain: Secondary | ICD-10-CM

## 2017-06-12 ENCOUNTER — Telehealth (HOSPITAL_COMMUNITY): Payer: Self-pay | Admitting: *Deleted

## 2017-06-12 ENCOUNTER — Other Ambulatory Visit (HOSPITAL_COMMUNITY): Payer: Self-pay | Admitting: Psychiatry

## 2017-06-12 ENCOUNTER — Telehealth (HOSPITAL_COMMUNITY): Payer: Self-pay | Admitting: Psychiatry

## 2017-06-12 DIAGNOSIS — F411 Generalized anxiety disorder: Secondary | ICD-10-CM

## 2017-06-12 DIAGNOSIS — F319 Bipolar disorder, unspecified: Secondary | ICD-10-CM

## 2017-06-12 DIAGNOSIS — F900 Attention-deficit hyperactivity disorder, predominantly inattentive type: Secondary | ICD-10-CM

## 2017-06-12 MED ORDER — AMPHETAMINE-DEXTROAMPHETAMINE 30 MG PO TABS: 30.0000 mg | ORAL_TABLET | Freq: Two times a day (BID) | ORAL | 0 refills | Status: DC

## 2017-06-12 MED ORDER — CLONAZEPAM 0.5 MG PO TABS: 0.5000 mg | ORAL_TABLET | Freq: Three times a day (TID) | ORAL | 0 refills | Status: DC | PRN

## 2017-06-12 MED ORDER — LURASIDONE HCL 120 MG PO TABS: 120.0000 mg | ORAL_TABLET | Freq: Every day | ORAL | 0 refills | Status: DC

## 2017-06-12 NOTE — Telephone Encounter (Signed)
Kanae picked up prescription on 12/26/79 dlh lic [id number] dlh

## 2017-06-12 NOTE — Op Note (Signed)
06/05/2017  3:49 PM  PATIENT:  Nancy Bowers  41 y.o. 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  I recommended the above procedure. Patient understood the risks, benefits, and alternatives and potential outcomes and wished to proceed.  PROCEDURE DETAILS: Patient was brought to the operating room placed under general endotracheal anesthesia. Patient was placed in the supine position on the operating room table. The neck was prepped with betadine and chloraprep and draped in a sterile fashion.     Local anesthestic was injected and a transverse incision was made on the right side of the neck.  Dissection was carried down thru the subcutaneous tissue and the platysma was  elevated, opened, and undermined with Metzenbaum scissors.  Dissection was then carried out thru an avascular plane leaving the sternocleidomastoid, carotid artery, and jugular vein laterally and the trachea and esophagus medially. The ventral aspect of the vertebral column was identified and a localizing x-ray was taken. The C4-5 level was identified. The longus colli muscles were then elevated and the retractor was placed. Distraction pins were placed above and below the disc.  The annulus was incised and the disc space entered. Discectomy was performed with micro-curettes and pituitary and kerrison rongeurs. I then used the high-speed drill to flatten the uncovertebral joints. The operating microscope was draped and brought into the field provided additional magnification, illumination and visualization. Utilizing microsurgical technique, discectomy was  continued posteriorly thru the disc space. Posterior longitudinal ligament was opened with a nerve hook, and then removed along with disc herniation, decompressing the spinal canal and thecal sac. We then measured the height of the intravertebral disc space and selected The appropriate size Mobi-C arthroplasty device. It was then gently positioned in the intravertebral disc space and countersunk. The distraction pins and retractor were removed. The wound was irrigated with bacitracin solution, checked for hemostasis which was established and confirmed. Once meticulous hemostasis was achieved, we then proceeded with closure. The platysma was closed with interrupted 3-0 undyed Vicryl suture, the subcuticular layer was closed with interrupted 3-0 undyed Vicryl suture. The skin edges were approximated with dermabond. The drapes were removed. A sterile dressing was applied. The patient was then awakened from general anesthesia and transferred to the recovery room in stable condition. At the end of the procedure all sponge, needle and instrument counts were correct.   PATIENT DISPOSITION:  PACU - hemodynamically stable.   Delay start of Pharmacological VTE agent (>24hrs) due to surgical blood loss or risk of bleeding:  yes

## 2017-06-12 NOTE — Telephone Encounter (Signed)
Patient walked in asking for refills of Adderall, Lurasidone, and Klonopin. States she has been taking as prescribed but had neck surgery recently and was unable to make appointment. Next appointment 07/26/17. Explained to patient that her MD was not in today but will call her once her medications have been approved by MD for refill.

## 2017-06-12 NOTE — Telephone Encounter (Signed)
Called refills of Lurasidone and Klonopin to [facility] per patient request. Dr. Ladona Ridgel gave approval for 30 day fill. Adderall refill was completed and patient states she will pick up before the end of the day.

## 2017-06-13 ENCOUNTER — Ambulatory Visit (INDEPENDENT_AMBULATORY_CARE_PROVIDER_SITE_OTHER): Payer: Medicaid Other | Admitting: Orthopaedic Surgery

## 2017-06-13 DIAGNOSIS — M25561 Pain in right knee: Secondary | ICD-10-CM

## 2017-06-13 DIAGNOSIS — M25562 Pain in left knee: Secondary | ICD-10-CM

## 2017-06-13 DIAGNOSIS — G8929 Other chronic pain: Secondary | ICD-10-CM

## 2017-06-13 NOTE — Progress Notes (Signed)
The patient is here for follow-up of MRIs of both her knees. She is recently had disc replacement surgery of the cervical spine. She had significant falls and buckling of both her knees and there is concern there was some ligamentous damage in the knees. She still feels like the knees buckle and give way on her and she is post to have therapy at home. On examination of both knees are needed or effusion of either knee. All the knees are painful range of motion full but these are ligamentously stable. MRIs are reviewed and other than a bone contusion of the posterior tibial plateau on the right side and some attenuations of the anterior anterior cruciate ligament fibers on the left side both knees have the major ligaments intact with no cartilage damage are no significant tearing of either knee. I gave her reassurance that the buckling of her knees may just be due to deconditioning and she just needs to strengthen her quad muscles are Muscles and I will help. She can always try over-the-counter knee braces if needed and there is no surgical indication clinicalexamandMRIfindingsforherknees.Allquestionsandconcernswereansweredandaddressed.She'llfollow-upasneeded.

## 2017-07-10 ENCOUNTER — Other Ambulatory Visit (HOSPITAL_COMMUNITY): Payer: Self-pay

## 2017-07-10 DIAGNOSIS — F319 Bipolar disorder, unspecified: Secondary | ICD-10-CM

## 2017-07-10 DIAGNOSIS — F411 Generalized anxiety disorder: Secondary | ICD-10-CM

## 2017-07-10 MED ORDER — CLONAZEPAM 0.5 MG PO TABS: 0.5000 mg | ORAL_TABLET | Freq: Three times a day (TID) | ORAL | 0 refills | Status: DC | PRN

## 2017-07-10 MED ORDER — LAMOTRIGINE 200 MG PO TABS: 200.0000 mg | ORAL_TABLET | Freq: Every day | ORAL | 0 refills | Status: DC

## 2017-07-11 ENCOUNTER — Telehealth (HOSPITAL_COMMUNITY): Payer: Self-pay | Admitting: Psychiatry

## 2017-07-11 ENCOUNTER — Other Ambulatory Visit (HOSPITAL_COMMUNITY): Payer: Self-pay

## 2017-07-11 DIAGNOSIS — F900 Attention-deficit hyperactivity disorder, predominantly inattentive type: Secondary | ICD-10-CM

## 2017-07-11 MED ORDER — AMPHETAMINE-DEXTROAMPHETAMINE 30 MG PO TABS: 30.0000 mg | ORAL_TABLET | Freq: Two times a day (BID) | ORAL | 0 refills | Status: DC

## 2017-07-11 NOTE — Telephone Encounter (Signed)
Nancy Bowers, father picked up prescription on 16/06/9609/24/18 lic [id number] dlh

## 2017-07-26 ENCOUNTER — Encounter (HOSPITAL_COMMUNITY): Payer: Self-pay | Admitting: Psychiatry

## 2017-07-26 ENCOUNTER — Ambulatory Visit (INDEPENDENT_AMBULATORY_CARE_PROVIDER_SITE_OTHER): Payer: Medicaid Other | Admitting: Psychiatry

## 2017-07-26 DIAGNOSIS — F411 Generalized anxiety disorder: Secondary | ICD-10-CM | POA: Diagnosis not present

## 2017-07-26 DIAGNOSIS — F319 Bipolar disorder, unspecified: Secondary | ICD-10-CM | POA: Diagnosis not present

## 2017-07-26 DIAGNOSIS — F17210 Nicotine dependence, cigarettes, uncomplicated: Secondary | ICD-10-CM | POA: Diagnosis not present

## 2017-07-26 DIAGNOSIS — F900 Attention-deficit hyperactivity disorder, predominantly inattentive type: Secondary | ICD-10-CM | POA: Diagnosis not present

## 2017-07-26 DIAGNOSIS — Z818 Family history of other mental and behavioral disorders: Secondary | ICD-10-CM

## 2017-07-26 DIAGNOSIS — Z79899 Other long term (current) drug therapy: Secondary | ICD-10-CM

## 2017-07-26 MED ORDER — AMPHETAMINE-DEXTROAMPHETAMINE 30 MG PO TABS: 30.0000 mg | ORAL_TABLET | Freq: Two times a day (BID) | ORAL | 0 refills | Status: DC

## 2017-07-26 MED ORDER — LAMOTRIGINE 200 MG PO TABS: 200.0000 mg | ORAL_TABLET | Freq: Every day | ORAL | 2 refills | Status: DC

## 2017-07-26 MED ORDER — CLONAZEPAM 0.5 MG PO TABS: 0.5000 mg | ORAL_TABLET | Freq: Four times a day (QID) | ORAL | 1 refills | Status: DC | PRN

## 2017-07-26 MED ORDER — LURASIDONE HCL 40 MG PO TABS: 40.0000 mg | ORAL_TABLET | Freq: Every day | ORAL | 2 refills | Status: DC
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
## 2017-08-23 ENCOUNTER — Other Ambulatory Visit (HOSPITAL_COMMUNITY): Payer: Self-pay | Admitting: Psychiatry

## 2017-08-23 DIAGNOSIS — F319 Bipolar disorder, unspecified: Secondary | ICD-10-CM

## 2017-08-23 DIAGNOSIS — F411 Generalized anxiety disorder: Secondary | ICD-10-CM

## 2017-08-27 ENCOUNTER — Ambulatory Visit: Payer: Medicaid Other | Admitting: Physical Therapy

## 2017-08-31 ENCOUNTER — Encounter: Payer: Self-pay | Admitting: Physical Therapy

## 2017-08-31 ENCOUNTER — Ambulatory Visit: Payer: Medicaid Other | Attending: Neurological Surgery | Admitting: Physical Therapy

## 2017-08-31 DIAGNOSIS — M47812 Spondylosis without myelopathy or radiculopathy, cervical region: Secondary | ICD-10-CM | POA: Diagnosis present

## 2017-08-31 DIAGNOSIS — R262 Difficulty in walking, not elsewhere classified: Secondary | ICD-10-CM | POA: Insufficient documentation

## 2017-08-31 DIAGNOSIS — M6281 Muscle weakness (generalized): Secondary | ICD-10-CM | POA: Diagnosis present

## 2017-08-31 DIAGNOSIS — M542 Cervicalgia: Secondary | ICD-10-CM | POA: Insufficient documentation
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
## 2017-09-19 ENCOUNTER — Encounter: Payer: Self-pay | Admitting: Physical Therapy

## 2017-09-19 ENCOUNTER — Ambulatory Visit: Payer: Medicaid Other | Attending: Neurological Surgery | Admitting: Physical Therapy

## 2017-09-19 DIAGNOSIS — M6281 Muscle weakness (generalized): Secondary | ICD-10-CM | POA: Diagnosis present

## 2017-09-19 DIAGNOSIS — R262 Difficulty in walking, not elsewhere classified: Secondary | ICD-10-CM | POA: Diagnosis present

## 2017-09-19 DIAGNOSIS — M542 Cervicalgia: Secondary | ICD-10-CM | POA: Diagnosis not present
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
## 2017-09-20 ENCOUNTER — Telehealth (HOSPITAL_COMMUNITY): Payer: Self-pay

## 2017-09-20 NOTE — Telephone Encounter (Signed)
Patient is calling for a refill on her Klonopin and her Adderall, she has an appointment next week, but is out of medication. Please review and advise, thank you

## 2017-09-20 NOTE — Telephone Encounter (Signed)
Give 30 days supply of Adderall  Klonopin enough to scheduled appt

## 2017-09-21 MED ORDER — AMPHETAMINE-DEXTROAMPHETAMINE 30 MG PO TABS: 30.0000 mg | ORAL_TABLET | Freq: Two times a day (BID) | ORAL | 0 refills | Status: DC

## 2017-09-24 ENCOUNTER — Ambulatory Visit: Payer: Medicaid Other | Admitting: Physical Therapy

## 2017-09-24 ENCOUNTER — Encounter: Payer: Self-pay | Admitting: Physical Therapy

## 2017-09-24 DIAGNOSIS — R262 Difficulty in walking, not elsewhere classified: Secondary | ICD-10-CM

## 2017-09-24 DIAGNOSIS — M6281 Muscle weakness (generalized): Secondary | ICD-10-CM

## 2017-09-24 DIAGNOSIS — M542 Cervicalgia: Secondary | ICD-10-CM
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
## 2017-09-25 NOTE — Telephone Encounter (Signed)
Called in the Klonopin and patient will pick up her Adderall.

## 2017-09-27 ENCOUNTER — Ambulatory Visit (HOSPITAL_COMMUNITY): Payer: Self-pay | Admitting: Psychiatry

## 2017-09-27 ENCOUNTER — Ambulatory Visit: Payer: Medicaid Other | Admitting: Physical Therapy

## 2017-09-28 ENCOUNTER — Other Ambulatory Visit (HOSPITAL_COMMUNITY): Payer: Self-pay

## 2017-09-28 ENCOUNTER — Ambulatory Visit: Payer: Medicaid Other | Admitting: Physical Therapy

## 2017-09-28 DIAGNOSIS — F411 Generalized anxiety disorder: Secondary | ICD-10-CM

## 2017-09-28 DIAGNOSIS — F319 Bipolar disorder, unspecified: Secondary | ICD-10-CM

## 2017-09-28 MED ORDER — CLONAZEPAM 0.5 MG PO TABS: 0.5000 mg | ORAL_TABLET | Freq: Four times a day (QID) | ORAL | 0 refills | Status: DC | PRN

## 2017-10-08 ENCOUNTER — Telehealth (HOSPITAL_COMMUNITY): Payer: Self-pay | Admitting: Psychiatry

## 2017-10-08 NOTE — Telephone Encounter (Signed)
10/08/17 - Husband and patient came to pick up Rx. DL#: [id number].

## 2017-10-10 ENCOUNTER — Ambulatory Visit: Payer: Medicaid Other | Admitting: Physical Therapy

## 2017-10-10 ENCOUNTER — Encounter: Payer: Self-pay | Admitting: Physical Therapy

## 2017-10-10 DIAGNOSIS — M542 Cervicalgia: Secondary | ICD-10-CM | POA: Diagnosis not present

## 2017-10-10 DIAGNOSIS — M6281 Muscle weakness (generalized): Secondary | ICD-10-CM

## 2017-10-10 DIAGNOSIS — R262 Difficulty in walking, not elsewhere classified: Secondary | ICD-10-CM
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
## 2017-10-16 ENCOUNTER — Other Ambulatory Visit (HOSPITAL_COMMUNITY): Payer: Self-pay | Admitting: Psychiatry

## 2017-10-16 DIAGNOSIS — F319 Bipolar disorder, unspecified: Secondary | ICD-10-CM

## 2017-10-18 ENCOUNTER — Encounter (HOSPITAL_COMMUNITY): Payer: Self-pay | Admitting: Psychiatry

## 2017-10-18 ENCOUNTER — Ambulatory Visit (INDEPENDENT_AMBULATORY_CARE_PROVIDER_SITE_OTHER): Payer: Medicaid Other | Admitting: Psychiatry

## 2017-10-18 VITALS — BP 104/72 | HR 100 | Ht 65.0 in | Wt 138.0 lb

## 2017-10-18 DIAGNOSIS — Z915 Personal history of self-harm: Secondary | ICD-10-CM | POA: Diagnosis not present

## 2017-10-18 DIAGNOSIS — Z9851 Tubal ligation status: Secondary | ICD-10-CM | POA: Diagnosis not present

## 2017-10-18 DIAGNOSIS — R202 Paresthesia of skin: Secondary | ICD-10-CM

## 2017-10-18 DIAGNOSIS — Z818 Family history of other mental and behavioral disorders: Secondary | ICD-10-CM

## 2017-10-18 DIAGNOSIS — F1099 Alcohol use, unspecified with unspecified alcohol-induced disorder: Secondary | ICD-10-CM

## 2017-10-18 DIAGNOSIS — F17210 Nicotine dependence, cigarettes, uncomplicated: Secondary | ICD-10-CM

## 2017-10-18 DIAGNOSIS — G4700 Insomnia, unspecified: Secondary | ICD-10-CM

## 2017-10-18 DIAGNOSIS — F411 Generalized anxiety disorder: Secondary | ICD-10-CM | POA: Diagnosis not present

## 2017-10-18 DIAGNOSIS — F900 Attention-deficit hyperactivity disorder, predominantly inattentive type: Secondary | ICD-10-CM | POA: Diagnosis not present

## 2017-10-18 DIAGNOSIS — F3110 Bipolar disorder, current episode manic without psychotic features, unspecified: Secondary | ICD-10-CM | POA: Diagnosis not present

## 2017-10-18 DIAGNOSIS — R450 Nervousness: Secondary | ICD-10-CM | POA: Diagnosis not present

## 2017-10-18 DIAGNOSIS — F902 Attention-deficit hyperactivity disorder, combined type: Secondary | ICD-10-CM

## 2017-10-18 DIAGNOSIS — F319 Bipolar disorder, unspecified: Secondary | ICD-10-CM

## 2017-10-18 MED ORDER — AMPHETAMINE-DEXTROAMPHETAMINE 30 MG PO TABS: 30.0000 mg | ORAL_TABLET | Freq: Two times a day (BID) | ORAL | 0 refills | Status: DC

## 2017-10-18 MED ORDER — LURASIDONE HCL 80 MG PO TABS: 80.0000 mg | ORAL_TABLET | Freq: Every day | ORAL | 2 refills | Status: DC

## 2017-10-18 MED ORDER — CLONAZEPAM 0.5 MG PO TABS: 0.5000 mg | ORAL_TABLET | Freq: Four times a day (QID) | ORAL | 2 refills | Status: DC | PRN

## 2017-10-18 MED ORDER — LAMOTRIGINE 200 MG PO TABS: 200.0000 mg | ORAL_TABLET | Freq: Every day | ORAL | 2 refills | Status: DC
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
## 2017-10-22 ENCOUNTER — Ambulatory Visit: Payer: Medicaid Other | Attending: Internal Medicine | Admitting: Physical Therapy

## 2017-10-22 ENCOUNTER — Encounter: Payer: Self-pay | Admitting: Physical Therapy

## 2017-10-22 DIAGNOSIS — R262 Difficulty in walking, not elsewhere classified: Secondary | ICD-10-CM | POA: Insufficient documentation

## 2017-10-22 DIAGNOSIS — M6281 Muscle weakness (generalized): Secondary | ICD-10-CM

## 2017-10-22 DIAGNOSIS — M542 Cervicalgia: Secondary | ICD-10-CM | POA: Insufficient documentation
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
## 2017-10-23 ENCOUNTER — Ambulatory Visit: Payer: Medicaid Other | Admitting: Physical Therapy

## 2017-10-23 ENCOUNTER — Encounter: Payer: Self-pay | Admitting: Physical Therapy

## 2017-10-23 DIAGNOSIS — R262 Difficulty in walking, not elsewhere classified: Secondary | ICD-10-CM

## 2017-10-23 DIAGNOSIS — M542 Cervicalgia: Secondary | ICD-10-CM

## 2017-10-23 DIAGNOSIS — M6281 Muscle weakness (generalized): Secondary | ICD-10-CM
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
## 2017-10-29 ENCOUNTER — Ambulatory Visit: Payer: Medicaid Other | Admitting: Physical Therapy

## 2017-10-30 ENCOUNTER — Emergency Department (HOSPITAL_COMMUNITY)
Admission: EM | Admit: 2017-10-30 | Discharge: 2017-10-30 | Disposition: A | Discharge status: Home / Self Care | Payer: Medicaid Other | Attending: Emergency Medicine | Admitting: Emergency Medicine

## 2017-10-30 ENCOUNTER — Encounter (HOSPITAL_COMMUNITY): Payer: Self-pay

## 2017-10-30 ENCOUNTER — Other Ambulatory Visit: Payer: Self-pay

## 2017-10-30 DIAGNOSIS — F429 Obsessive-compulsive disorder, unspecified: Secondary | ICD-10-CM | POA: Diagnosis not present

## 2017-10-30 DIAGNOSIS — L03116 Cellulitis of left lower limb: Secondary | ICD-10-CM | POA: Diagnosis not present

## 2017-10-30 DIAGNOSIS — J449 Chronic obstructive pulmonary disease, unspecified: Secondary | ICD-10-CM | POA: Insufficient documentation

## 2017-10-30 DIAGNOSIS — F17210 Nicotine dependence, cigarettes, uncomplicated: Secondary | ICD-10-CM | POA: Diagnosis not present

## 2017-10-30 DIAGNOSIS — L539 Erythematous condition, unspecified: Secondary | ICD-10-CM | POA: Diagnosis present

## 2017-10-30 DIAGNOSIS — F419 Anxiety disorder, unspecified: Secondary | ICD-10-CM | POA: Insufficient documentation

## 2017-10-30 DIAGNOSIS — M5000 Cervical disc disorder with myelopathy, unspecified cervical region: Secondary | ICD-10-CM | POA: Diagnosis not present

## 2017-10-30 DIAGNOSIS — Z79899 Other long term (current) drug therapy: Secondary | ICD-10-CM | POA: Insufficient documentation

## 2017-10-30 DIAGNOSIS — L03114 Cellulitis of left upper limb: Secondary | ICD-10-CM | POA: Diagnosis not present

## 2017-10-30 DIAGNOSIS — F909 Attention-deficit hyperactivity disorder, unspecified type: Secondary | ICD-10-CM | POA: Insufficient documentation

## 2017-10-30 MED ORDER — OXYCODONE HCL 5 MG PO TABS
5.0000 mg | ORAL_TABLET | Freq: Once | ORAL | Status: AC
  Administered 2017-10-30: 5 mg via ORAL
  Filled 2017-10-30: qty 1

## 2017-10-30 MED ORDER — AMOXICILLIN-POT CLAVULANATE 875-125 MG PO TABS
1.0000 | ORAL_TABLET | Freq: Once | ORAL | Status: AC
  Administered 2017-10-30: 1 via ORAL
  Filled 2017-10-30: qty 1

## 2017-10-30 MED ORDER — AMOXICILLIN-POT CLAVULANATE 875-125 MG PO TABS: 1.0000 | ORAL_TABLET | Freq: Two times a day (BID) | ORAL | 0 refills | Status: DC

## 2017-10-30 NOTE — ED Triage Notes (Signed)
Patient c/o left foot laceration x 1 week. Patient reports that she has redness and swelling to the left foot. patient has been using Neosporin.. patiaent also c/o left hand swelling x 2 days. Patient denies any injury.
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
## 2017-10-30 NOTE — Discharge Instructions (Signed)
Clean your toe wounds daily with soap and water and apply antibiotic ointment. See your primary care physician if not improving in 4-5 days. Cellulitis usually does not improve until day 3. Take a daily probiotic to prevent diarrhea from the Augmentin antibiotic.  This is available at the pharmacy, over-the-counter

## 2017-10-31 ENCOUNTER — Ambulatory Visit: Payer: Medicaid Other | Admitting: Physical Therapy

## 2017-11-02 ENCOUNTER — Other Ambulatory Visit: Payer: Self-pay

## 2017-11-02 ENCOUNTER — Encounter (HOSPITAL_COMMUNITY): Payer: Self-pay | Admitting: *Deleted

## 2017-11-02 DIAGNOSIS — W268XXS Contact with other sharp object(s), not elsewhere classified, sequela: Secondary | ICD-10-CM

## 2017-11-02 DIAGNOSIS — I96 Gangrene, not elsewhere classified: Principal | ICD-10-CM | POA: Diagnosis present

## 2017-11-02 DIAGNOSIS — L02612 Cutaneous abscess of left foot: Secondary | ICD-10-CM | POA: Diagnosis present

## 2017-11-02 DIAGNOSIS — L03114 Cellulitis of left upper limb: Secondary | ICD-10-CM | POA: Diagnosis present

## 2017-11-02 DIAGNOSIS — Z885 Allergy status to narcotic agent status: Secondary | ICD-10-CM

## 2017-11-02 DIAGNOSIS — F909 Attention-deficit hyperactivity disorder, unspecified type: Secondary | ICD-10-CM | POA: Diagnosis present

## 2017-11-02 DIAGNOSIS — S91115S Laceration without foreign body of left lesser toe(s) without damage to nail, sequela: Secondary | ICD-10-CM

## 2017-11-02 DIAGNOSIS — F17210 Nicotine dependence, cigarettes, uncomplicated: Secondary | ICD-10-CM | POA: Diagnosis present

## 2017-11-02 DIAGNOSIS — L03032 Cellulitis of left toe: Secondary | ICD-10-CM | POA: Diagnosis present

## 2017-11-02 DIAGNOSIS — L03116 Cellulitis of left lower limb: Secondary | ICD-10-CM | POA: Diagnosis present

## 2017-11-02 DIAGNOSIS — J449 Chronic obstructive pulmonary disease, unspecified: Secondary | ICD-10-CM | POA: Diagnosis present

## 2017-11-02 DIAGNOSIS — F319 Bipolar disorder, unspecified: Secondary | ICD-10-CM | POA: Diagnosis present

## 2017-11-02 DIAGNOSIS — Y92009 Unspecified place in unspecified non-institutional (private) residence as the place of occurrence of the external cause: Secondary | ICD-10-CM

## 2017-11-02 DIAGNOSIS — Z886 Allergy status to analgesic agent status: Secondary | ICD-10-CM

## 2017-11-02 DIAGNOSIS — Z91030 Bee allergy status: Secondary | ICD-10-CM

## 2017-11-02 LAB — COMPREHENSIVE METABOLIC PANEL
ALT: 16 U/L (ref 14–54)
AST: 17 U/L (ref 15–41)
Albumin: 3.6 g/dL (ref 3.5–5.0)
Alkaline Phosphatase: 66 U/L (ref 38–126)
Anion gap: 9 (ref 5–15)
BUN: 12 mg/dL (ref 6–20)
CO2: 22 mmol/L (ref 22–32)
Calcium: 8.7 mg/dL — ABNORMAL LOW (ref 8.9–10.3)
Chloride: 109 mmol/L (ref 101–111)
Creatinine, Ser: 0.67 mg/dL (ref 0.44–1.00)
GFR calc Af Amer: >60 mL/min (ref >60)
GFR calc non Af Amer: >60 mL/min (ref >60)
Glucose, Bld: 84 mg/dL (ref 65–99)
Potassium: 3.8 mmol/L (ref 3.5–5.1)
Sodium: 140 mmol/L (ref 135–145)
Total Bilirubin: 0.4 mg/dL (ref 0.3–1.2)
Total Protein: 6 g/dL — ABNORMAL LOW (ref 6.5–8.1)

## 2017-11-02 LAB — CBC WITH DIFFERENTIAL/PLATELET
Basophils Absolute: 0.1 10*3/uL (ref 0.0–0.1)
Basophils Relative: 1 %
Eosinophils Absolute: 0.3 10*3/uL (ref 0.0–0.7)
Eosinophils Relative: 4 %
HCT: 37.9 % (ref 36.0–46.0)
Hemoglobin: 12.7 g/dL (ref 12.0–15.0)
Lymphocytes Relative: 37 %
Lymphs Abs: 2.6 10*3/uL (ref 0.7–4.0)
MCH: 31.1 pg (ref 26.0–34.0)
MCHC: 33.5 g/dL (ref 30.0–36.0)
MCV: 92.9 fL (ref 78.0–100.0)
Monocytes Absolute: 0.3 10*3/uL (ref 0.1–1.0)
Monocytes Relative: 4 %
Neutro Abs: 3.8 10*3/uL (ref 1.7–7.7)
Neutrophils Relative %: 54 %
Platelets: 350 10*3/uL (ref 150–400)
RBC: 4.08 MIL/uL (ref 3.87–5.11)
RDW: 11.8 % (ref 11.5–15.5)
WBC: 7 10*3/uL (ref 4.0–10.5)

## 2017-11-02 LAB — I-STAT BETA HCG BLOOD, ED (MC, WL, AP ONLY): I-stat hCG, quantitative: <5 m[IU]/mL (ref <5)

## 2017-11-02 LAB — I-STAT CG4 LACTIC ACID, ED: Lactic Acid, Venous: 1.12 mmol/L (ref 0.5–1.9)

## 2017-11-02 MED ORDER — OXYCODONE-ACETAMINOPHEN 5-325 MG PO TABS
1.0000 | ORAL_TABLET | ORAL | Status: DC | PRN
  Administered 2017-11-02: 1 via ORAL
  Filled 2017-11-02: qty 1

## 2017-11-02 NOTE — ED Notes (Signed)
[facility] for infection to L middle toe, was given percocet and antibiotic prescription at that time. Denies direct injury but reports walking barefoot in her house often. Initial small wound started to posterior middle toe on L foot; infection has spread to anterior middle toe with redness extending up middle of foot, wound with purulent discharge. C/o increased pain on ambulation. Bandage applied to foot in triage

## 2017-11-03 ENCOUNTER — Emergency Department (HOSPITAL_COMMUNITY): Payer: Medicaid Other

## 2017-11-03 ENCOUNTER — Encounter (HOSPITAL_COMMUNITY): Payer: Self-pay | Admitting: Family Medicine

## 2017-11-03 ENCOUNTER — Inpatient Hospital Stay (HOSPITAL_COMMUNITY)
Admission: EM | Admit: 2017-11-03 | Discharge: 2017-11-05 | DRG: 240 | Disposition: A | Discharge status: Home Health | Payer: Medicaid Other | Attending: Internal Medicine | Admitting: Internal Medicine

## 2017-11-03 DIAGNOSIS — W268XXS Contact with other sharp object(s), not elsewhere classified, sequela: Secondary | ICD-10-CM | POA: Diagnosis not present

## 2017-11-03 DIAGNOSIS — S91115S Laceration without foreign body of left lesser toe(s) without damage to nail, sequela: Secondary | ICD-10-CM | POA: Diagnosis not present

## 2017-11-03 DIAGNOSIS — Z91030 Bee allergy status: Secondary | ICD-10-CM | POA: Diagnosis not present

## 2017-11-03 DIAGNOSIS — L03116 Cellulitis of left lower limb: Secondary | ICD-10-CM | POA: Diagnosis present

## 2017-11-03 DIAGNOSIS — Z885 Allergy status to narcotic agent status: Secondary | ICD-10-CM | POA: Diagnosis not present

## 2017-11-03 DIAGNOSIS — Z886 Allergy status to analgesic agent status: Secondary | ICD-10-CM | POA: Diagnosis not present

## 2017-11-03 DIAGNOSIS — F17210 Nicotine dependence, cigarettes, uncomplicated: Secondary | ICD-10-CM | POA: Diagnosis present

## 2017-11-03 DIAGNOSIS — L03114 Cellulitis of left upper limb: Secondary | ICD-10-CM | POA: Diagnosis present

## 2017-11-03 DIAGNOSIS — L03032 Cellulitis of left toe: Secondary | ICD-10-CM

## 2017-11-03 DIAGNOSIS — I96 Gangrene, not elsewhere classified: Secondary | ICD-10-CM | POA: Diagnosis present

## 2017-11-03 DIAGNOSIS — F17200 Nicotine dependence, unspecified, uncomplicated: Secondary | ICD-10-CM | POA: Diagnosis present

## 2017-11-03 DIAGNOSIS — F909 Attention-deficit hyperactivity disorder, unspecified type: Secondary | ICD-10-CM | POA: Diagnosis present

## 2017-11-03 DIAGNOSIS — L02612 Cutaneous abscess of left foot: Secondary | ICD-10-CM | POA: Diagnosis present

## 2017-11-03 DIAGNOSIS — F319 Bipolar disorder, unspecified: Secondary | ICD-10-CM | POA: Diagnosis present

## 2017-11-03 DIAGNOSIS — J449 Chronic obstructive pulmonary disease, unspecified: Secondary | ICD-10-CM | POA: Diagnosis present

## 2017-11-03 DIAGNOSIS — Y92009 Unspecified place in unspecified non-institutional (private) residence as the place of occurrence of the external cause: Secondary | ICD-10-CM | POA: Diagnosis not present

## 2017-11-03 LAB — SURGICAL PCR SCREEN
MRSA, PCR: NEGATIVE
Staphylococcus aureus: POSITIVE — AB

## 2017-11-03 LAB — RAPID URINE DRUG SCREEN, HOSP PERFORMED
Amphetamines: POSITIVE — AB
Barbiturates: NOT DETECTED
Benzodiazepines: POSITIVE — AB
Cocaine: NOT DETECTED
Opiates: NOT DETECTED
Tetrahydrocannabinol: NOT DETECTED

## 2017-11-03 LAB — URINALYSIS, ROUTINE W REFLEX MICROSCOPIC
Bacteria, UA: NONE SEEN
Bilirubin Urine: NEGATIVE
Glucose, UA: NEGATIVE mg/dL
Hgb urine dipstick: NEGATIVE
Ketones, ur: NEGATIVE mg/dL
Leukocytes, UA: NEGATIVE
Nitrite: NEGATIVE
Protein, ur: NEGATIVE mg/dL
RBC / HPF: NONE SEEN RBC/hpf (ref 0–5)
Specific Gravity, Urine: 1.012 (ref 1.005–1.030)
WBC, UA: NONE SEEN WBC/hpf (ref 0–5)
pH: 6 (ref 5.0–8.0)

## 2017-11-03 LAB — I-STAT CG4 LACTIC ACID, ED: Lactic Acid, Venous: 0.6 mmol/L (ref 0.5–1.9)

## 2017-11-03 MED ORDER — ONDANSETRON HCL 4 MG PO TABS: 4.0000 mg | ORAL_TABLET | Freq: Four times a day (QID) | ORAL | Status: DC | PRN

## 2017-11-03 MED ORDER — CHLORHEXIDINE GLUCONATE 4 % EX LIQD: 60.0000 mL | Freq: Once | CUTANEOUS | Status: DC

## 2017-11-03 MED ORDER — OXYCODONE-ACETAMINOPHEN 5-325 MG PO TABS
1.0000 | ORAL_TABLET | ORAL | Status: DC | PRN
  Administered 2017-11-03: 2 via ORAL
  Administered 2017-11-03 – 2017-11-04 (×4): 1 via ORAL
  Administered 2017-11-04: 2 via ORAL
  Administered 2017-11-05 (×4): 1 via ORAL
  Filled 2017-11-03 (×8): qty 1
  Filled 2017-11-03 (×2): qty 2

## 2017-11-03 MED ORDER — BISACODYL 5 MG PO TBEC: 5.0000 mg | DELAYED_RELEASE_TABLET | Freq: Every day | ORAL | Status: DC | PRN

## 2017-11-03 MED ORDER — CHLORHEXIDINE GLUCONATE CLOTH 2 % EX PADS
6.0000 | MEDICATED_PAD | Freq: Every day | CUTANEOUS | Status: DC
  Administered 2017-11-03 – 2017-11-05 (×2): 6 via TOPICAL

## 2017-11-03 MED ORDER — AMPHETAMINE-DEXTROAMPHETAMINE 10 MG PO TABS
30.0000 mg | ORAL_TABLET | Freq: Two times a day (BID) | ORAL | Status: DC
  Administered 2017-11-03 – 2017-11-05 (×4): 30 mg via ORAL
  Filled 2017-11-03 (×5): qty 3

## 2017-11-03 MED ORDER — KETOROLAC TROMETHAMINE 30 MG/ML IJ SOLN
30.0000 mg | Freq: Four times a day (QID) | INTRAMUSCULAR | Status: DC | PRN
  Administered 2017-11-03 – 2017-11-05 (×3): 30 mg via INTRAVENOUS
  Filled 2017-11-03 (×4): qty 1

## 2017-11-03 MED ORDER — PIPERACILLIN-TAZOBACTAM 3.375 G IVPB
3.3750 g | Freq: Three times a day (TID) | INTRAVENOUS | Status: DC
  Administered 2017-11-03 – 2017-11-05 (×7): 3.375 g via INTRAVENOUS
  Filled 2017-11-03 (×8): qty 50

## 2017-11-03 MED ORDER — METAXALONE 800 MG PO TABS
800.0000 mg | ORAL_TABLET | Freq: Two times a day (BID) | ORAL | Status: DC
  Administered 2017-11-03 – 2017-11-05 (×5): 800 mg via ORAL
  Filled 2017-11-03 (×6): qty 1

## 2017-11-03 MED ORDER — ONDANSETRON HCL 4 MG/2ML IJ SOLN: 4.0000 mg | Freq: Four times a day (QID) | INTRAMUSCULAR | Status: DC | PRN

## 2017-11-03 MED ORDER — MUPIROCIN 2 % EX OINT
1.0000 "application " | TOPICAL_OINTMENT | Freq: Two times a day (BID) | CUTANEOUS | Status: DC
  Administered 2017-11-03 – 2017-11-05 (×4): 1 via NASAL

## 2017-11-03 MED ORDER — FERROUS SULFATE 325 (65 FE) MG PO TABS
325.0000 mg | ORAL_TABLET | Freq: Every day | ORAL | Status: DC
  Administered 2017-11-05: 325 mg via ORAL
  Filled 2017-11-03 (×2): qty 1

## 2017-11-03 MED ORDER — LORATADINE 10 MG PO TABS
10.0000 mg | ORAL_TABLET | Freq: Every day | ORAL | Status: DC
  Administered 2017-11-03 – 2017-11-05 (×3): 10 mg via ORAL
  Filled 2017-11-03 (×3): qty 1

## 2017-11-03 MED ORDER — POVIDONE-IODINE 10 % EX SWAB: 2.0000 "application " | Freq: Once | CUTANEOUS | Status: DC

## 2017-11-03 MED ORDER — ACETAMINOPHEN 325 MG PO TABS: 650.0000 mg | ORAL_TABLET | Freq: Four times a day (QID) | ORAL | Status: DC | PRN

## 2017-11-03 MED ORDER — ONDANSETRON HCL 4 MG/2ML IJ SOLN
4.0000 mg | Freq: Once | INTRAMUSCULAR | Status: AC
  Administered 2017-11-03: 4 mg via INTRAVENOUS
  Filled 2017-11-03: qty 2

## 2017-11-03 MED ORDER — VANCOMYCIN HCL IN DEXTROSE 1-5 GM/200ML-% IV SOLN
1000.0000 mg | Freq: Two times a day (BID) | INTRAVENOUS | Status: DC
  Administered 2017-11-03 – 2017-11-05 (×5): 1000 mg via INTRAVENOUS
  Filled 2017-11-03 (×6): qty 200

## 2017-11-03 MED ORDER — OXYCODONE-ACETAMINOPHEN 10-325 MG PO TABS: 1.0000 | ORAL_TABLET | ORAL | Status: DC | PRN

## 2017-11-03 MED ORDER — CHLORHEXIDINE GLUCONATE 4 % EX LIQD
60.0000 mL | Freq: Once | CUTANEOUS | Status: AC
  Administered 2017-11-04: 4 via TOPICAL

## 2017-11-03 MED ORDER — NICOTINE 21 MG/24HR TD PT24
21.0000 mg | MEDICATED_PATCH | Freq: Every day | TRANSDERMAL | Status: DC
  Administered 2017-11-03 – 2017-11-05 (×3): 21 mg via TRANSDERMAL
  Filled 2017-11-03 (×3): qty 1

## 2017-11-03 MED ORDER — ALBUTEROL SULFATE (2.5 MG/3ML) 0.083% IN NEBU: 2.5000 mg | INHALATION_SOLUTION | RESPIRATORY_TRACT | Status: DC | PRN

## 2017-11-03 MED ORDER — OXYCODONE HCL 5 MG PO TABS
5.0000 mg | ORAL_TABLET | ORAL | Status: DC | PRN
  Administered 2017-11-03: 10 mg via ORAL
  Administered 2017-11-03 – 2017-11-04 (×3): 5 mg via ORAL
  Administered 2017-11-04: 10 mg via ORAL
  Administered 2017-11-04 – 2017-11-05 (×5): 5 mg via ORAL
  Filled 2017-11-03 (×2): qty 2
  Filled 2017-11-03 (×8): qty 1

## 2017-11-03 MED ORDER — ENOXAPARIN SODIUM 40 MG/0.4ML ~~LOC~~ SOLN
40.0000 mg | Freq: Every day | SUBCUTANEOUS | Status: DC
  Administered 2017-11-04 – 2017-11-05 (×2): 40 mg via SUBCUTANEOUS
  Filled 2017-11-03 (×2): qty 0.4

## 2017-11-03 MED ORDER — SENNOSIDES-DOCUSATE SODIUM 8.6-50 MG PO TABS: 1.0000 | ORAL_TABLET | Freq: Every evening | ORAL | Status: DC | PRN

## 2017-11-03 MED ORDER — CLONAZEPAM 0.5 MG PO TABS
0.5000 mg | ORAL_TABLET | Freq: Four times a day (QID) | ORAL | Status: DC | PRN
  Administered 2017-11-03 – 2017-11-05 (×5): 0.5 mg via ORAL
  Filled 2017-11-03 (×5): qty 1

## 2017-11-03 MED ORDER — LURASIDONE HCL 40 MG PO TABS
80.0000 mg | ORAL_TABLET | Freq: Every day | ORAL | Status: DC
  Administered 2017-11-03 – 2017-11-04 (×2): 80 mg via ORAL
  Filled 2017-11-03 (×3): qty 2

## 2017-11-03 MED ORDER — OXYCODONE HCL 5 MG PO TABS: 5.0000 mg | ORAL_TABLET | ORAL | Status: DC | PRN

## 2017-11-03 MED ORDER — ACETAMINOPHEN 650 MG RE SUPP: 650.0000 mg | Freq: Four times a day (QID) | RECTAL | Status: DC | PRN

## 2017-11-03 MED ORDER — CEFAZOLIN SODIUM-DEXTROSE 2-4 GM/100ML-% IV SOLN
2.0000 g | INTRAVENOUS | Status: AC
  Administered 2017-11-04: 2 g via INTRAVENOUS
  Filled 2017-11-03: qty 100

## 2017-11-03 MED ORDER — POVIDONE-IODINE 10 % EX SWAB
2.0000 "application " | Freq: Once | CUTANEOUS | Status: AC
  Administered 2017-11-04: 2 via TOPICAL

## 2017-11-03 MED ORDER — OXYCODONE-ACETAMINOPHEN 5-325 MG PO TABS: 1.0000 | ORAL_TABLET | ORAL | Status: DC | PRN

## 2017-11-03 MED ORDER — FLUTICASONE PROPIONATE 50 MCG/ACT NA SUSP: 1.0000 | Freq: Every day | NASAL | Status: DC | PRN

## 2017-11-03 MED ORDER — LAMOTRIGINE 100 MG PO TABS
200.0000 mg | ORAL_TABLET | Freq: Every day | ORAL | Status: DC
  Administered 2017-11-03 – 2017-11-05 (×3): 200 mg via ORAL
  Filled 2017-11-03 (×3): qty 2
  Filled 2017-11-03: qty 1

## 2017-11-03 MED ORDER — SODIUM CHLORIDE 0.9 % IV BOLUS (SEPSIS)
1000.0000 mL | Freq: Once | INTRAVENOUS | Status: AC
  Administered 2017-11-03: 1000 mL via INTRAVENOUS

## 2017-11-03 MED ORDER — HYDROMORPHONE HCL 1 MG/ML IJ SOLN
0.5000 mg | Freq: Once | INTRAMUSCULAR | Status: AC
  Administered 2017-11-03: 0.5 mg via INTRAVENOUS
  Filled 2017-11-03: qty 1
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
## 2017-11-03 NOTE — Progress Notes (Signed)
Nasal PCR positive for staph.

## 2017-11-03 NOTE — Progress Notes (Signed)
Pharmacy Antibiotic Note  Richelle ItoCarey Dolin is a 42 y.o. female admitted on 11/03/2017 with cellulitis.  Pharmacy has been consulted for Vancomycin dosing. WBC WNL. Renal function good.   Plan: Vancomycin 1000 mg IV q12h Trend WBC, temp, renal function  F/U infectious work-up Drug levels as indicated (goal 10-15 mg/L)  Temp (24hrs), Avg:98.6 F (37 C), Min:98.6 F (37 C), Max:98.6 F (37 C)  Recent Labs  Lab 11/02/17 2131 11/02/17 2201  WBC 7.0  --   CREATININE 0.67  --   LATICACIDVEN  --  1.12    Estimated Creatinine Clearance: 81.6 mL/min (by C-G formula based on SCr of 0.67 mg/dL).    Allergies  Allergen Reactions  . Aspirin Swelling    thoart  . Fentanyl Rash  . Bee Venom Hives  . Tylenol [Acetaminophen] Swelling    Tolerates percocet and norco   Abran DukeLedford, Neil Errickson 11/03/2017 3:20 AM
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
## 2017-11-03 NOTE — ED Notes (Signed)
No answer when called for treatment room.  ?

## 2017-11-03 NOTE — ED Notes (Signed)
Pt remains in waiting room. Updated on wait for treatment room. 
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
## 2017-11-03 NOTE — Progress Notes (Signed)
Pharmacy Antibiotic Note  Richelle ItoCarey Bence is a 42 y.o. female admitted on 11/03/2017 with cellulitis.  Pharmacy has been consulted for Vancomycin dosing and now Zosyn. WBC WNL. Renal function good.   Plan: Start Zosyn 3.375g IV every 8 hours - 4hr infusion Continue Vancomycin 1000 mg IV q12h Trend WBC, temp, renal function  F/U infectious work-up Drug levels as indicated (goal 10-15 mg/L)  Temp (24hrs), Avg:98.5 F (36.9 C), Min:98.4 F (36.9 C), Max:98.6 F (37 C)  Recent Labs  Lab 11/02/17 2131 11/02/17 2201 11/03/17 0405  WBC 7.0  --   --   CREATININE 0.67  --   --   LATICACIDVEN  --  1.12 0.60    Estimated Creatinine Clearance: 81.6 mL/min (by C-G formula based on SCr of 0.67 mg/dL).    Allergies  Allergen Reactions  . Aspirin Swelling    thoart  . Fentanyl Rash  . Bee Venom Hives  . Tylenol [Acetaminophen] Swelling    Tolerates percocet and norco   Fayne NorrieMillen, Abisola Carrero Brown 11/03/2017 9:14 AM

## 2017-11-03 NOTE — Progress Notes (Signed)
Patient ID: Nancy Bowers Soeder, female   DOB: 06/08/1976, 42 y.o.   MRN: [id number]                                                                PROGRESS NOTE                                                                                                                                                                                                             Patient Demographics:    Nancy Bowers Neu, is a 42 y.o. female, DOB - 07/12/1976, WUJ:[id number]  Admit date - 11/03/2017   Admitting Physician Briscoe Deutscherimothy S Opyd, MD  Outpatient Primary MD for the patient is Fleet ContrasAvbuere, Edwin, MD  LOS - 0  Outpatient Specialists:       Chief Complaint  Patient presents with  . Cellulitis       Brief Narrative 42 y.o. female with medical history significant for bipolar disorder, now presenting to the emergency department for evaluation of worsening redness, swelling, drainage, and pain at the left foot despite antibiotics for cellulitis.  Patient reports that she was walking barefoot in her home approximately 3 weeks ago, stepped on a sharp piece of plastic, suffered a tiny laceration at the base of her left third toe, and subsequently went on to develop some erythema, swelling, and tenderness at the site.  She was evaluated in the emergency department on 10/30/2017, had no systemic signs of infection, was sent home with Augmentin and analgesic.  She reports strict adherence with the medication, but is continued to worsen with swelling and erythema now progressing to involve the dorsal foot.  She also has new purulent drainage from the base of the third toe.  She reports chills, but no fevers per se.  ED Course: Upon arrival to the ED, patient is found to be afebrile, saturating well on room air, and chemistry panel and CBC are unremarkable, lactic acid is reassuringly normal, and CT of the left foot features soft tissue swelling consistent with cellulitis without underlying bony abnormality.  Patient was started on  vancomycin by the ED physician.  She remains hemodynamically stable, and and will be admitted to the medical-surgical unit for ongoing evaluation and management of left foot cellulitis, purulent, worsening despite outpatient antibiotics for the past 4 days.  Subjective:    Markella Dao today has,redness of the 3rd toe left foot, and skin ulcer under it.    No headache, No chest pain, No abdominal pain - No Nausea, No new weakness tingling or numbness, No Cough - SOB.    Assessment  & Plan :    Principal Problem:   Cellulitis of left foot Active Problems:   TOBACCO ABUSE   Bipolar 1 disorder (HCC)    1. Cellulitis of left foot  - Presents with worsening purulent cellulitis of left foot despite starting Augmentin 4 days ago  - CT neg for underlying osseous abnormalities  - No fever in ED, no leukocytosis, lactic acid normal  - Started on empiric vancomycin in ED  -start zosyn iv pharmacy to dose - ED physician will discuss with ortho  - Elevate leg, continue abx, prn analgesia    2. Bipolar disorder  - Stable, no SI, HI, or hallucinations - Continue Lamictal, Latuda, and Klonopin   3. Current smoker  - Smokes 1/2-1 ppd - Counseled toward cessation  - Not ready to quit  - Nicotine patch provided     DVT prophylaxis: Lovenox Code Status: Full  Family Communication: Mother and significant other updated with patient's permission  Disposition Plan: Admit to med-surg Consults called: None Admission status: Inpatient         Lab Results  Component Value Date   PLT 350 11/02/2017    Antibiotics:  vanco 2/15=>,  Zosyn 2/16=>  Anti-infectives (From admission, onward)   Start     Dose/Rate Route Frequency Ordered Stop   11/03/17 0330  vancomycin (VANCOCIN) IVPB 1000 mg/200 mL premix     1,000 mg 200 mL/hr over 60 Minutes Intravenous Every 12 hours 11/03/17 0320          Objective:   Vitals:   11/03/17 0530 11/03/17 0545 11/03/17 0632 11/03/17 0641    BP: (!) 144/72 (!) 143/75 (!) 157/89   Pulse: 91 80  78  Resp:      Temp:      TempSrc:      SpO2: 100% 100%  100%    Wt Readings from Last 3 Encounters:  10/30/17 62.6 kg (138 lb)  06/05/17 63.5 kg (140 lb)  04/29/17 63.5 kg (140 lb)    No intake or output data in the 24 hours ending 11/03/17 0720   Physical Exam  Awake Alert, Oriented X 3, No new F.N deficits, Normal affect [place].AT,PERRAL Supple Neck,No JVD, No cervical lymphadenopathy appriciated.  Symmetrical Chest wall movement, Good air movement bilaterally, CTAB RRR,No Gallops,Rubs or new Murmurs, No Parasternal Heave +ve B.Sounds, Abd Soft, No tenderness, No organomegaly appriciated, No rebound - guarding or rigidity. No Cyanosis, Clubbing or edema, redness over the dorsum of the left foot and redness , slight skin ulcer under the left 3rd toe    Data Review:    CBC Recent Labs  Lab 11/02/17 2131  WBC 7.0  HGB 12.7  HCT 37.9  PLT 350  MCV 92.9  MCH 31.1  MCHC 33.5  RDW 11.8  LYMPHSABS 2.6  MONOABS 0.3  EOSABS 0.3  BASOSABS 0.1    Chemistries  Recent Labs  Lab 11/02/17 2131  NA 140  K 3.8  CL 109  CO2 22  GLUCOSE 84  BUN 12  CREATININE 0.67  CALCIUM 8.7*  AST 17  ALT 16  ALKPHOS 66  BILITOT 0.4   ------------------------------------------------------------------------------------------------------------------ No results for input(s): CHOL, HDL, LDLCALC, TRIG, CHOLHDL, LDLDIRECT in the  last 72 hours.  Lab Results  Component Value Date   HGBA1C 5.0 06/28/2016   ------------------------------------------------------------------------------------------------------------------ No results for input(s): TSH, T4TOTAL, T3FREE, THYROIDAB in the last 72 hours.  Invalid input(s): FREET3 ------------------------------------------------------------------------------------------------------------------ No results for input(s): VITAMINB12, FOLATE, FERRITIN, TIBC, IRON, RETICCTPCT in the last 72  hours.  Coagulation profile No results for input(s): INR, PROTIME in the last 168 hours.  No results for input(s): DDIMER in the last 72 hours.  Cardiac Enzymes No results for input(s): CKMB, TROPONINI, MYOGLOBIN in the last 168 hours.  Invalid input(s): CK ------------------------------------------------------------------------------------------------------------------ No results found for: BNP  Inpatient Medications  Scheduled Meds: . enoxaparin (LOVENOX) injection  40 mg Subcutaneous Daily  . lamoTRIgine  200 mg Oral Daily  . loratadine  10 mg Oral Daily  . lurasidone  80 mg Oral QAC supper  . metaxalone  800 mg Oral BID  . nicotine  21 mg Transdermal Daily   Continuous Infusions: . vancomycin Stopped (11/03/17 0458)   PRN Meds:.acetaminophen **OR** acetaminophen, albuterol, bisacodyl, clonazePAM, fluticasone, ketorolac, ondansetron **OR** ondansetron (ZOFRAN) IV, oxyCODONE-acetaminophen **AND** oxyCODONE, senna-docusate  Micro Results No results found for this or any previous visit (from the past 240 hour(s)).  Radiology Reports Ct Foot Left Wo Contrast  Result Date: 11/03/2017 CLINICAL DATA:  Left foot laceration x1 week with erythema and swelling of the left foot. Assess for osteomyelitis. EXAM: CT OF THE LEFT FOOT WITHOUT CONTRAST TECHNIQUE: Multidetector CT imaging of the left foot was performed according to the standard protocol. Multiplanar CT image reconstructions were also generated. COMPARISON:  None. FINDINGS: Bones/Joint/Cartilage Distal tibia and fibula: Intact without bone destruction or fracture. Sclerotic density in the tibial metaphysis anteriorly compatible with a bone island. Hindfoot: Intact tibiotalar and subtalar joints. Small bone islands of the talus. No fracture or bone destruction. No joint dislocation. Midfoot: Intact articulations without fracture or joint dislocation. No suspicious osseous lesions. Forefoot: Bone island of the first metatarsal head.  Fracture or bone destruction. No joint dislocation. Ligaments Suboptimally assessed by CT. Muscles and Tendons No muscle atrophy, hemorrhage or hematoma. The tendons crossing the ankle joint and foot are maintained without tenosynovitis. Soft tissues Diffuse soft tissue swelling over the dorsum of the foot and lateral ankle. IMPRESSION: Soft tissue swelling of the dorsum of the foot and lateral ankle compatible with cellulitis. No evidence of osteomyelitis or fracture. Electronically Signed   By: Tollie Eth M.D.   On: 11/03/2017 03:41    Time Spent in minutes  30   Pearson Grippe M.D on 11/03/2017 at 7:20 AM  Between 7am to 7pm - Pager - [phone]   After 7pm go to [email or web address] - password [facility]  [facility] -  Office  [phone]

## 2017-11-03 NOTE — ED Notes (Signed)
Pt returned from injury.

## 2017-11-03 NOTE — Progress Notes (Signed)
Discussed case with Dr.

## 2017-11-04 ENCOUNTER — Inpatient Hospital Stay (HOSPITAL_COMMUNITY): Payer: Medicaid Other | Admitting: Certified Registered Nurse Anesthetist

## 2017-11-04 ENCOUNTER — Encounter (HOSPITAL_COMMUNITY)
Admission: EM | Disposition: A | Discharge status: Home Health | Payer: Self-pay | Source: Home / Self Care | Attending: Internal Medicine

## 2017-11-04 HISTORY — PX: AMPUTATION: SHX166

## 2017-11-04 LAB — CBC WITH DIFFERENTIAL/PLATELET
Basophils Absolute: 0.1 10*3/uL (ref 0.0–0.1)
Basophils Relative: 1 %
Eosinophils Absolute: 0.4 10*3/uL (ref 0.0–0.7)
Eosinophils Relative: 6 %
HCT: 37.4 % (ref 36.0–46.0)
Hemoglobin: 12.4 g/dL (ref 12.0–15.0)
Lymphocytes Relative: 43 %
Lymphs Abs: 2.7 10*3/uL (ref 0.7–4.0)
MCH: 30.2 pg (ref 26.0–34.0)
MCHC: 33.2 g/dL (ref 30.0–36.0)
MCV: 91.2 fL (ref 78.0–100.0)
Monocytes Absolute: 0.3 10*3/uL (ref 0.1–1.0)
Monocytes Relative: 4 %
Neutro Abs: 2.9 10*3/uL (ref 1.7–7.7)
Neutrophils Relative %: 46 %
Platelets: 322 10*3/uL (ref 150–400)
RBC: 4.1 MIL/uL (ref 3.87–5.11)
RDW: 11.6 % (ref 11.5–15.5)
WBC: 6.3 10*3/uL (ref 4.0–10.5)

## 2017-11-04 LAB — BASIC METABOLIC PANEL
Anion gap: 9 (ref 5–15)
BUN: 11 mg/dL (ref 6–20)
CO2: 22 mmol/L (ref 22–32)
Calcium: 8.6 mg/dL — ABNORMAL LOW (ref 8.9–10.3)
Chloride: 106 mmol/L (ref 101–111)
Creatinine, Ser: 0.67 mg/dL (ref 0.44–1.00)
GFR calc Af Amer: >60 mL/min (ref >60)
GFR calc non Af Amer: >60 mL/min (ref >60)
Glucose, Bld: 117 mg/dL — ABNORMAL HIGH (ref 65–99)
Potassium: 3.9 mmol/L (ref 3.5–5.1)
Sodium: 137 mmol/L (ref 135–145)

## 2017-11-04 LAB — HIV ANTIBODY (ROUTINE TESTING W REFLEX): HIV Screen 4th Generation wRfx: NONREACTIVE

## 2017-11-04 SURGERY — AMPUTATION, FOOT, RAY
Anesthesia: Monitor Anesthesia Care | Site: Foot | Laterality: Left

## 2017-11-04 MED ORDER — HYDROMORPHONE HCL 1 MG/ML IJ SOLN: 0.2500 mg | INTRAMUSCULAR | Status: DC | PRN

## 2017-11-04 MED ORDER — MIDAZOLAM HCL 5 MG/5ML IJ SOLN
INTRAMUSCULAR | Status: DC | PRN
  Administered 2017-11-04: 2 mg via INTRAVENOUS

## 2017-11-04 MED ORDER — ROPIVACAINE HCL 5 MG/ML IJ SOLN
INTRAMUSCULAR | Status: DC | PRN
  Administered 2017-11-04: 25 mL

## 2017-11-04 MED ORDER — AMPHETAMINE-DEXTROAMPHETAMINE 10 MG PO TABS
30.0000 mg | ORAL_TABLET | Freq: Once | ORAL | Status: AC
  Administered 2017-11-04: 30 mg via ORAL

## 2017-11-04 MED ORDER — LIDOCAINE HCL 1 % IJ SOLN
INTRAMUSCULAR | Status: DC | PRN
  Administered 2017-11-04: 18 mL

## 2017-11-04 MED ORDER — 0.9 % SODIUM CHLORIDE (POUR BTL) OPTIME
TOPICAL | Status: DC | PRN
  Administered 2017-11-04: 1000 mL

## 2017-11-04 MED ORDER — PROPOFOL 500 MG/50ML IV EMUL
INTRAVENOUS | Status: DC | PRN
  Administered 2017-11-04: 100 ug/kg/min via INTRAVENOUS

## 2017-11-04 MED ORDER — LIDOCAINE HCL (PF) 1 % IJ SOLN
INTRAMUSCULAR | Status: AC
  Filled 2017-11-04: qty 30

## 2017-11-04 MED ORDER — LACTATED RINGERS IV SOLN
INTRAVENOUS | Status: DC | PRN
  Administered 2017-11-04: 08:00:00 via INTRAVENOUS

## 2017-11-04 SURGICAL SUPPLY — 28 items
BLADE SAW SGTL MED 73X18.5 STR (BLADE) IMPLANT
BLADE SURG 21 STRL SS (BLADE) ×3 IMPLANT
BNDG COHESIVE 4X5 TAN STRL (GAUZE/BANDAGES/DRESSINGS) ×3 IMPLANT
BNDG GAUZE ELAST 4 BULKY (GAUZE/BANDAGES/DRESSINGS) ×3 IMPLANT
COVER SURGICAL LIGHT HANDLE (MISCELLANEOUS) ×6 IMPLANT
DRAPE U-SHAPE 47X51 STRL (DRAPES) ×6 IMPLANT
DRSG ADAPTIC 3X8 NADH LF (GAUZE/BANDAGES/DRESSINGS) ×3 IMPLANT
DRSG PAD ABDOMINAL 8X10 ST (GAUZE/BANDAGES/DRESSINGS) ×6 IMPLANT
DURAPREP 26ML APPLICATOR (WOUND CARE) ×3 IMPLANT
ELECT REM PT RETURN 9FT ADLT (ELECTROSURGICAL) ×3
ELECTRODE REM PT RTRN 9FT ADLT (ELECTROSURGICAL) ×1 IMPLANT
GAUZE SPONGE 4X4 12PLY STRL (GAUZE/BANDAGES/DRESSINGS) ×3 IMPLANT
GLOVE BIOGEL PI IND STRL 9 (GLOVE) ×1 IMPLANT
GLOVE BIOGEL PI INDICATOR 9 (GLOVE) ×2
GLOVE SURG ORTHO 9.0 STRL STRW (GLOVE) ×3 IMPLANT
GOWN STRL REUS W/ TWL XL LVL3 (GOWN DISPOSABLE) ×2 IMPLANT
GOWN STRL REUS W/TWL XL LVL3 (GOWN DISPOSABLE) ×6
KIT BASIN OR (CUSTOM PROCEDURE TRAY) ×3 IMPLANT
KIT ROOM TURNOVER OR (KITS) ×3 IMPLANT
NS IRRIG 1000ML POUR BTL (IV SOLUTION) ×3 IMPLANT
PACK ORTHO EXTREMITY (CUSTOM PROCEDURE TRAY) ×3 IMPLANT
PAD ARMBOARD 7.5X6 YLW CONV (MISCELLANEOUS) ×6 IMPLANT
STOCKINETTE IMPERVIOUS LG (DRAPES) IMPLANT
SUT ETHILON 2 0 PSLX (SUTURE) ×3 IMPLANT
TOWEL OR 17X26 10 PK STRL BLUE (TOWEL DISPOSABLE) ×3 IMPLANT
TUBE CONNECTING 12'X1/4 (SUCTIONS) ×1
TUBE CONNECTING 12X1/4 (SUCTIONS) ×2 IMPLANT
YANKAUER SUCT BULB TIP NO VENT (SUCTIONS) ×3 IMPLANT

## 2017-11-04 NOTE — Transfer of Care (Signed)
Immediate Anesthesia Transfer of Care Note  Patient: Nancy Bowers  Procedure(s) Performed: THIRD FOOT  RAY AMPUTATION (Left Foot)  Patient Location: PACU  Anesthesia Type:MAC combined with regional for post-op pain  Level of Consciousness: awake, alert , oriented and patient cooperative  Airway & Oxygen Therapy: Patient Spontanous Breathing  Post-op Assessment: Report given to RN and Post -op Vital signs reviewed and stable  Post vital signs: Reviewed and stable  Last Vitals:  Vitals:   11/03/17 1930 11/04/17 0625  BP: 119/78 120/71  Pulse: 85 70  Resp: 18 17  Temp: 36.4 C 36.4 C  SpO2: 96% 100%    Last Pain:  Vitals:   11/04/17 0625  TempSrc: Oral  PainSc:       Patients Stated Pain Goal: 2 (54/27/06 2376)  Complications: Bowers apparent anesthesia complications

## 2017-11-04 NOTE — Anesthesia Procedure Notes (Signed)
Procedure Name: MAC Date/Time: 11/04/2017 7:53 AM Performed by: Oletta Lamas, CRNA Pre-anesthesia Checklist: Patient identified, Emergency Drugs available, Suction available, Patient being monitored and Timeout performed Patient Re-evaluated:Patient Re-evaluated prior to induction Oxygen Delivery Method: Simple face mask

## 2017-11-04 NOTE — Progress Notes (Signed)
Orthopedic Tech Progress Note Patient Details:  Nancy Bowers 12/24/1975 [id number]  Ortho Devices Type of Ortho Device: Postop shoe/boot Ortho Device/Splint Location: lle Ortho Device/Splint Interventions: Application   Post Interventions Patient Tolerated: Well Instructions Provided: Care of device   Nancy Bowers, Nancy Bowers 11/04/2017, 10:24 AM

## 2017-11-04 NOTE — Op Note (Signed)
11/04/2017  8:39 AM  PATIENT:  Nancy Bowers    PRE-OPERATIVE DIAGNOSIS:  cellulitis of toe  POST-OPERATIVE DIAGNOSIS:  Same  PROCEDURE:  THIRD FOOT  RAY AMPUTATION  SURGEON:  Nadara MustardMarcus V Jamez Ambrocio, MD  PHYSICIAN ASSISTANT:None ANESTHESIA:   General  PREOPERATIVE INDICATIONS:  Nancy ItoCarey Taddei is a  42 y.o. female with a diagnosis of cellulitis of toe who failed conservative measures and elected for surgical management.  Patient had large necrotic ulcer with exposed bone and tendon of the third toe.  Repeat examination showed progressive necrosis around the third toe the ascending cellulitis had resolved with IV antibiotics.  Patient had increased drainage from the toe.  Due to worsening of the soft tissue envelope of the third toe and improvement of the ascending cellulitis patient has elected to proceed with a third ray amputation.  The risks benefits and alternatives were discussed with the patient preoperatively including but not limited to the risks of infection, bleeding, nerve injury, cardiopulmonary complications, the need for revision surgery, among others, and the patient was willing to proceed.  OPERATIVE IMPLANTS: None  OPERATIVE FINDINGS: No ascending abscess.  Good petechial bleeding around the wound edges.  OPERATIVE PROCEDURE: Patient was brought to the operating room after undergoing an ankle block.  Her left lower extremity was then prepped using DuraPrep draped into a sterile field a timeout was called.  Patient underwent local infiltration with additional 20 cc of 1% lidocaine plain.  After adequate levels of anesthesia were obtained a V incision was made around the third toe.  The third ray was resected through the midshaft of the third metatarsal.  The necrotic tissue and ray were resected in one block of tissue.  Electrocautery was used for hemostasis the wound was irrigated with normal saline there is no deep abscess.  The wound was closed using 2-0 nylon.  A sterile dressing  was applied patient was taken the PACU in stable condition.   DISCHARGE PLANNING:  Antibiotic duration: Would continue IV antibiotics for 48 hours postoperatively.  Weightbearing: Nonweightbearing on the left.  Pain medication: As needed  Dressing care/ Wound VAC: Change dressing in the office in 1 week  Ambulatory devices: Crutches or walker.  Discharge to: Home.  Follow-up: In the office 1 week post operative.

## 2017-11-04 NOTE — Anesthesia Postprocedure Evaluation (Signed)
Anesthesia Post Note  Patient: Joannah Gitlin  Procedure(s) Performed: THIRD FOOT  RAY AMPUTATION (Left Foot)     Patient location during evaluation: PACU Anesthesia Type: MAC and Regional Level of consciousness: awake and alert Pain management: pain level controlled Vital Signs Assessment: post-procedure vital signs reviewed and stable Respiratory status: spontaneous breathing, nonlabored ventilation and respiratory function stable Cardiovascular status: stable and blood pressure returned to baseline Postop Assessment: no apparent nausea or vomiting Anesthetic complications: no    Last Vitals:  Vitals:   11/04/17 0846 11/04/17 0855  BP: 131/74   Pulse: 64   Resp: 12   Temp:  36.5 C  SpO2: 100% 100%    Last Pain:  Vitals:   11/04/17 0855  TempSrc:   PainSc: 0-No pain    LLE Motor Response: Purposeful movement;Responds to commands(rotates leg - can not flex foot due to block) (11/04/17 0855) LLE Sensation: Decreased;No pain;No tingling (11/04/17 0855)          Jeffery Gammell,W. EDMOND

## 2017-11-04 NOTE — Anesthesia Procedure Notes (Signed)
Anesthesia Regional Block: Ankle block   Pre-Anesthetic Checklist: ,, timeout performed, Correct Patient, Correct Site, Correct Laterality, Correct Procedure, Correct Position, site marked, Risks and benefits discussed, pre-op evaluation,  At surgeon's request and post-op pain management  Laterality: Left  Prep: Maximum Sterile Barrier Precautions used, chloraprep       Needles:  Injection technique: Single-shot  Needle Type: Other     Needle Length: 3cm  Needle Gauge: 25     Additional Needles:   Narrative:  Start time: 11/04/2017 7:40 AM End time: 11/04/2017 7:45 AM Injection made incrementally with aspirations every 5 mL. Anesthesiologist: Gaynelle AduFitzgerald, Allora Bains, MD  Additional Notes: Superficial and deep Peroneal, Posterior Tibial injected. Negative aspiration of blood prior to all injections.

## 2017-11-04 NOTE — Progress Notes (Signed)
Patient ID: Nancy Bowers, female   DOB: Aug 14, 1976, 42 y.o.   MRN: [id number]                                                                PROGRESS NOTE                                                                                                                                                                                                             Patient Demographics:    Nancy Bowers, is a 42 y.o. female, DOB - 24-Jan-1976, WUJ:[id number]  Admit date - 11/03/2017   Admitting Physician Briscoe Deutscher, MD  Outpatient Primary MD for the patient is Fleet Contras, MD  LOS - 1  Outpatient Specialists:     Chief Complaint  Patient presents with  . Cellulitis       Brief Narrative    42 y.o.femalewith medical history significant forbipolar disorder, now presenting to the emergency department for evaluation of worsening redness, swelling, drainage, and pain at the left foot despite antibiotics for cellulitis. Patient reports that she was walking barefoot in her home approximately 3 weeks ago, stepped on a sharp piece of plastic, suffered a tiny laceration at the base of her left third toe, and subsequently went on to develop some erythema, swelling, and tenderness at the site. She was evaluated in the emergency department on 10/30/2017, had no systemic signs of infection, was sent home with Augmentin and analgesic. She reports strict adherence with the medication, but is continued to worsen with swelling and erythema now progressing to involve the dorsal foot. She also has new purulent drainage from the base of the third toe. She reports chills, but no fevers per se.  ED Course:Upon arrival to the ED, patient is found to be afebrile, saturating well on room air, and chemistry panel and CBC are unremarkable, lactic acid is reassuringly normal, and CT of the left foot features soft tissue swelling consistent with cellulitis without underlying bony abnormality. Patient was started on  vancomycin by the ED physician. She remains hemodynamically stable, and and will be admitted to the medical-surgical unit for ongoing evaluation and management of left foot cellulitis, purulent, worsening despite outpatient antibiotics for the past 4 days.      Subjective:    Nancy Bowers today has,  been to OR for left 3rd ray amputation.  Pt has been afebrile.    No headache, No chest pain, No abdominal pain - No Nausea, No new weakness tingling or numbness, No Cough - SOB.    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  PT/OT to evaluate Home health RN, Home health aide Case management to please arrange  2.Bipolar disorder -Stable, no SI, HI, or hallucinations -Continue Lamictal, Latuda, and Klonopin  3.Current smoker -Smokes 1/2-1 ppd - Counseled toward cessation -Not ready to quit -Nicotine patch provided   DVT prophylaxis:Lovenox Code Status:Full Family Communication:no family in room today Disposition Plan:Admit to med-surg Consults called:None Admission status:Inpatient       Lab Results  Component Value Date   PLT 322 11/04/2017    Antibiotics  :  vanco 2/15=>, zosyn 2/16=>  Anti-infectives (From admission, onward)   Start     Dose/Rate Route Frequency Ordered Stop   11/04/17 0700  ceFAZolin (ANCEF) IVPB 2g/100 mL premix     2 g 200 mL/hr over 30 Minutes  Intravenous To ShortStay Surgical 11/03/17 1127 11/04/17 0826   11/03/17 0915  piperacillin-tazobactam (ZOSYN) IVPB 3.375 g     3.375 g 12.5 mL/hr over 240 Minutes Intravenous Every 8 hours 11/03/17 0915     11/03/17 0330  vancomycin (VANCOCIN) IVPB 1000 mg/200 mL premix     1,000 mg 200 mL/hr over 60 Minutes Intravenous Every 12 hours 11/03/17 0320          Objective:   Vitals:   11/04/17 0831 11/04/17 0834 11/04/17 0846 11/04/17 0855  BP: (!) 128/103 (!) 139/95 131/74   Pulse:  99 64   Resp: 13 10 12    Temp: 97.8 F (36.6 C)   97.7 F (36.5 C)  TempSrc:      SpO2: 100% (!) 79% 100% 100%    Wt Readings from Last 3 Encounters:  10/30/17 62.6 kg (138 lb)  06/05/17 63.5 kg (140 lb)  04/29/17 63.5 kg (140 lb)     Intake/Output Summary (Last 24 hours) at 11/04/2017 1056 Last data filed at 11/04/2017 0855 Gross per 24 hour  Intake 950 ml  Output 415 ml  Net 535 ml     Physical Exam  Awake Alert, Oriented X 3, No new F.N deficits, Normal affect [place].AT,PERRAL Supple Neck,No JVD, No cervical lymphadenopathy appriciated.  Symmetrical Chest wall movement, Good air movement bilaterally, CTAB RRR,No Gallops,Rubs or new Murmurs, No Parasternal Heave +ve B.Sounds, Abd Soft, No tenderness, No organomegaly appriciated, No rebound - guarding or rigidity. No Cyanosis, Clubbing or edema, No new Rash or bruise   Left distal foot wrapped toe pink    Data Review:    CBC Recent Labs  Lab 11/02/17 2131 11/04/17 0558  WBC 7.0 6.3  HGB 12.7 12.4  HCT 37.9 37.4  PLT 350 322  MCV 92.9 91.2  MCH 31.1 30.2  MCHC 33.5 33.2  RDW 11.8 11.6  LYMPHSABS 2.6 2.7  MONOABS 0.3 0.3  EOSABS 0.3 0.4  BASOSABS 0.1 0.1    Chemistries  Recent Labs  Lab 11/02/17 2131 11/04/17 0558  NA 140 137  K 3.8 3.9  CL 109 106  CO2 22 22  GLUCOSE 84 117*  BUN 12 11  CREATININE 0.67 0.67  CALCIUM 8.7* 8.6*  AST 17  --   ALT 16  --   ALKPHOS 66  --   BILITOT 0.4  --     ------------------------------------------------------------------------------------------------------------------ No results for input(s): CHOL, HDL, LDLCALC, TRIG, CHOLHDL, LDLDIRECT in the last 72 hours.  Lab Results  Component Value Date   HGBA1C 5.0 06/28/2016   ------------------------------------------------------------------------------------------------------------------ No results for input(s): TSH, T4TOTAL, T3FREE, THYROIDAB in the last 72 hours.  Invalid input(s): FREET3 ------------------------------------------------------------------------------------------------------------------ No results for input(s): VITAMINB12, FOLATE, FERRITIN, TIBC, IRON, RETICCTPCT in the last 72 hours.  Coagulation profile No results for input(s): INR, PROTIME in the last 168 hours.  No results for input(s): DDIMER in the last 72 hours.  Cardiac Enzymes No results for input(s): CKMB, TROPONINI, MYOGLOBIN in the last 168 hours.  Invalid input(s): CK ------------------------------------------------------------------------------------------------------------------ No results found for: BNP  Inpatient Medications  Scheduled Meds: . amphetamine-dextroamphetamine  30 mg Oral BID  . Chlorhexidine Gluconate Cloth  6 each Topical Daily  . enoxaparin (LOVENOX) injection  40 mg Subcutaneous Daily  . ferrous sulfate  325 mg Oral Q breakfast  . lamoTRIgine  200 mg Oral Daily  . loratadine  10 mg Oral Daily  . lurasidone  80 mg Oral QAC supper  . metaxalone  800 mg Oral BID  . mupirocin ointment  1 application Nasal BID  . nicotine  21 mg Transdermal Daily   Continuous Infusions: . piperacillin-tazobactam (ZOSYN)  IV 3.375 g (11/04/17 1028)  . vancomycin 1,000 mg (11/04/17 0530)   PRN Meds:.acetaminophen **OR** acetaminophen, albuterol, bisacodyl, clonazePAM, fluticasone, ketorolac, ondansetron **OR** ondansetron (ZOFRAN) IV, oxyCODONE-acetaminophen **AND** oxyCODONE, senna-docusate  Micro  Results Recent Results (from the past 240 hour(s))  Surgical pcr screen     Status: Abnormal   Collection Time: 11/03/17 11:56 AM  Result Value Ref Range Status   MRSA, PCR NEGATIVE NEGATIVE Final   Staphylococcus aureus POSITIVE (A) NEGATIVE Final    Comment: (NOTE) The Xpert SA Assay (FDA approved for NASAL specimens in patients 43 years of age and older), is one component of a comprehensive surveillance program. It is not intended to diagnose infection nor to guide or monitor treatment. Performed at [facility], [address] [place], [place] [address]     Radiology Reports Ct Foot Left Wo Contrast  Result Date: 11/03/2017 CLINICAL DATA:  Left foot laceration x1 week with erythema and swelling of the left foot. Assess for osteomyelitis. EXAM: CT OF THE LEFT FOOT WITHOUT CONTRAST TECHNIQUE: Multidetector CT imaging of the left foot was performed according to the standard protocol. Multiplanar CT image reconstructions were also generated. COMPARISON:  None. FINDINGS: Bones/Joint/Cartilage Distal tibia and fibula: Intact without bone destruction or fracture. Sclerotic density in the tibial metaphysis anteriorly compatible with a bone island. Hindfoot: Intact tibiotalar and subtalar joints. Small bone islands of the talus. No fracture or bone destruction. No joint dislocation. Midfoot: Intact articulations without fracture or joint dislocation. No suspicious osseous lesions. Forefoot: Bone island of the first metatarsal head. Fracture or bone destruction. No joint dislocation. Ligaments Suboptimally assessed by CT. Muscles and Tendons No muscle atrophy, hemorrhage or hematoma. The  tendons crossing the ankle joint and foot are maintained without tenosynovitis. Soft tissues Diffuse soft tissue swelling over the dorsum of the foot and lateral ankle. IMPRESSION: Soft tissue swelling of the dorsum of the foot and lateral ankle compatible with cellulitis. No evidence of osteomyelitis or  fracture. Electronically Signed   By: Tollie Eth M.D.   On: 11/03/2017 03:41    Time Spent in minutes  30   Pearson Grippe M.D on 11/04/2017 at 10:56 AM  Between 7am to 7pm - Pager - [phone]  After 7pm go to [email or web address] - password [facility]  [facility] -  Office  [phone]

## 2017-11-04 NOTE — Evaluation (Signed)
Physical Therapy Evaluation Patient Details Name: Nancy ItoCarey Bowers MRN: [id number] DOB: 05/30/1976 Today's Date: 11/04/2017   History of Present Illness    42 y.o.femalewith medical history significant forbipolar disorder, now presenting to the emergency department for evaluation of worsening redness, swelling, drainage, and pain at the left foot despite antibiotics for cellulitis. Patient reports that she was walking barefoot in her home approximately 3 weeks ago, stepped on a sharp piece of plastic, suffered a tiny laceration at the base of her left third toe, and subsequently went on to develop some erythema, swelling, and tenderness at the site. 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    Clinical Impression  Pt presents with moderate limitations to functional mobility due to pain, limited strength, limited ROM, restricted WB status LLE, resulting in dependency on assistive device for ambulation and difficulty performing transitional movements essential for functional independence.  Recommend nursing team to assist pt to chair and bathroom using RW to maintain NWB LLE.  PT will initiate care in hospital and [facility]  See below for details of exam findings and care plan for goals of care.    Follow Up Recommendations Home health PT    Equipment Recommendations       Recommendations for Other Services OT consult     Precautions / Restrictions Precautions Precautions: Fall Precaution Comments: up with RW and supervision or assist as needed Restrictions Weight Bearing Restrictions: Yes LLE Weight Bearing: Non weight bearing Other Position/Activity Restrictions: elevate LLE when not ambulating      Mobility  Bed Mobility Overal bed mobility: Modified Independent                Transfers Overall transfer level: Needs assistance Equipment used: Rolling walker (2 wheeled) Transfers: Sit to/from Stand Sit to Stand: Supervision          General transfer comment: using RW, pt able to stand and with min verbal cues to maintain NWB LLE  Ambulation/Gait Ambulation/Gait assistance: Min guard Ambulation Distance (Feet): 25 Feet Assistive device: Rolling walker (2 wheeled) Gait Pattern/deviations: Step-to pattern   Gait velocity interpretation: Below normal speed for age/gender General Gait Details: weakness and decr sensation in hands coupled with neck problems creates incr challenge using RW for NWB LLE, therefore pt has limited distance tolerance before shoulders/wrists hurt, able to counter with standing rest breaks and cover short distances otherwise without physical assistance  Stairs            Wheelchair Mobility    Modified Rankin (Stroke Patients Only)       Balance Overall balance assessment: Mild deficits observed, not formally tested                                           Pertinent Vitals/Pain Pain Assessment: No/denies pain    Home Living Family/patient expects to be discharged to:: Private residence Living Arrangements: Spouse/significant other Available Help at Discharge: Family;Available 24 hours/day Type of Home: Mobile home Home Access: Stairs to enter Entrance Stairs-Rails: Left Entrance Stairs-Number of Steps: 8 Home Layout: One level Home Equipment: Walker - 2 wheels;Shower seat;Bedside commode Additional Comments: lives with parents, spouse works, 43 yo son, mother has planned cervical surgery soon    Prior Function Level of Independence: Independent               Hand Dominance   Dominant Hand: Right  Extremity/Trunk Assessment   Upper Extremity Assessment Upper Extremity Assessment: Generalized weakness;Defer to OT evaluation    Lower Extremity Assessment Lower Extremity Assessment: LLE deficits/detail LLE Deficits / Details: NWB s/p 3rd ray amputation       Communication   Communication: No difficulties  Cognition Arousal/Alertness:  Awake/alert Behavior During Therapy: WFL for tasks assessed/performed Overall Cognitive Status: Within Functional Limits for tasks assessed                                 General Comments: very talkative, gives detailed history of neck problems, 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      General Comments General comments (skin integrity, edema, etc.): left foot in postop bandage/wrap, pt has elevated in bed; note celluitic left thumb and multiple scratches on left arm pt attributes to reaching under car seat for stylus.      Exercises     Assessment/Plan    PT Assessment Patient needs continued PT services  PT Problem List Pain;Impaired sensation;Decreased knowledge of use of DME;Decreased mobility;Decreased activity tolerance;Decreased range of motion;Decreased strength;Decreased balance       PT Treatment Interventions DME instruction;Stair training;Gait training;Functional mobility training;Therapeutic activities;Patient/family education;Modalities    PT Goals (Current goals can be found in the Care Plan section)  Acute Rehab PT Goals Patient Stated Goal: return to OPPT for hand therapy PT Goal Formulation: With patient Time For Goal Achievement: 11/18/17 Potential to Achieve Goals: Good    Frequency Min 5X/week   Barriers to discharge   not barriers, but pt worries about who will care for father while she and mother are recovering from surgery    Co-evaluation               AM-PAC PT "6 Clicks" Daily Activity  Outcome Measure Difficulty turning over in bed (including adjusting bedclothes, sheets and blankets)?: A Little Difficulty moving from lying on back to sitting on the side of the bed? : A Little Difficulty sitting down on and standing up from a chair with arms (e.g., wheelchair, bedside commode, etc,.)?: A Little Help needed moving to and from a bed to chair (including a wheelchair)?: A Little Help needed walking in hospital  room?: A Little Help needed climbing 3-5 steps with a railing? : A Lot 6 Click Score: 17    End of Session Equipment Utilized During Treatment: Other (comment)(donned cast shoe) Activity Tolerance: Patient limited by fatigue;Patient tolerated treatment well Patient left: in chair;with call bell/phone within reach;with family/visitor present Nurse Communication: Mobility status PT Visit Diagnosis: Unsteadiness on feet (R26.81);Pain Pain - Right/Left: Left Pain - part of body: Ankle and joints of foot    Time: 1352-1419 PT Time Calculation (min) (ACUTE ONLY): 27 min   Charges:   PT Evaluation $PT Eval Moderate Complexity: 1 Mod PT Treatments $Gait Training: 8-22 mins   PT G Codes:        Narda Amber, PT, DPT Board Certified Geriatric Clinical Specialist  Dennis Bast 11/04/2017, 2:27 PM

## 2017-11-04 NOTE — Anesthesia Preprocedure Evaluation (Addendum)
Anesthesia Evaluation  Patient identified by MRN, date of birth, ID band Patient awake    Reviewed: Allergy & Precautions, H&P , NPO status , Patient's Chart, lab work & pertinent test results  Airway Mallampati: II  TM Distance: >3 FB Neck ROM: Full    Dental no notable dental hx. (+) Poor Dentition, Dental Advisory Given   Pulmonary asthma , COPD,  COPD inhaler, Current Smoker,    Pulmonary exam normal breath sounds clear to auscultation       Cardiovascular negative cardio ROS   Rhythm:Regular Rate:Normal     Neuro/Psych  Headaches, Anxiety Bipolar Disorder    GI/Hepatic negative GI ROS, Neg liver ROS,   Endo/Other  negative endocrine ROS  Renal/GU negative Renal ROS  negative genitourinary   Musculoskeletal   Abdominal   Peds  (+) ADHD Hematology negative hematology ROS (+)   Anesthesia Other Findings   Reproductive/Obstetrics negative OB ROS                            Anesthesia Physical Anesthesia Plan  ASA: II  Anesthesia Plan: MAC and Regional   Post-op Pain Management:    Induction: Intravenous  PONV Risk Score and Plan: 2 and Ondansetron, Midazolam and Propofol infusion  Airway Management Planned: Simple Face Mask  Additional Equipment:   Intra-op Plan:   Post-operative Plan:   Informed Consent: I have reviewed the patients History and Physical, chart, labs and discussed the procedure including the risks, benefits and alternatives for the proposed anesthesia with the patient or authorized representative who has indicated his/her understanding and acceptance.   Dental advisory given  Plan Discussed with: CRNA  Anesthesia Plan Comments:        Anesthesia Quick Evaluation

## 2017-11-05 ENCOUNTER — Ambulatory Visit: Payer: Medicaid Other | Admitting: Physical Therapy

## 2017-11-05 ENCOUNTER — Encounter (HOSPITAL_COMMUNITY): Payer: Self-pay | Admitting: Orthopedic Surgery

## 2017-11-05 LAB — COMPREHENSIVE METABOLIC PANEL
ALT: 19 U/L (ref 14–54)
AST: 23 U/L (ref 15–41)
Albumin: 2.9 g/dL — ABNORMAL LOW (ref 3.5–5.0)
Alkaline Phosphatase: 60 U/L (ref 38–126)
Anion gap: 9 (ref 5–15)
BUN: 14 mg/dL (ref 6–20)
CO2: 23 mmol/L (ref 22–32)
Calcium: 8.5 mg/dL — ABNORMAL LOW (ref 8.9–10.3)
Chloride: 105 mmol/L (ref 101–111)
Creatinine, Ser: 0.7 mg/dL (ref 0.44–1.00)
GFR calc Af Amer: >60 mL/min (ref >60)
GFR calc non Af Amer: >60 mL/min (ref >60)
Glucose, Bld: 111 mg/dL — ABNORMAL HIGH (ref 65–99)
Potassium: 4.1 mmol/L (ref 3.5–5.1)
Sodium: 137 mmol/L (ref 135–145)
Total Bilirubin: 0.2 mg/dL — ABNORMAL LOW (ref 0.3–1.2)
Total Protein: 4.9 g/dL — ABNORMAL LOW (ref 6.5–8.1)

## 2017-11-05 LAB — CBC
HCT: 34.8 % — ABNORMAL LOW (ref 36.0–46.0)
Hemoglobin: 11.6 g/dL — ABNORMAL LOW (ref 12.0–15.0)
MCH: 30.4 pg (ref 26.0–34.0)
MCHC: 33.3 g/dL (ref 30.0–36.0)
MCV: 91.3 fL (ref 78.0–100.0)
Platelets: 316 10*3/uL (ref 150–400)
RBC: 3.81 MIL/uL — ABNORMAL LOW (ref 3.87–5.11)
RDW: 11.7 % (ref 11.5–15.5)
WBC: 8 10*3/uL (ref 4.0–10.5)

## 2017-11-05 LAB — CG4 I-STAT (LACTIC ACID): Lactic Acid, Venous: 0.5 mmol/L (ref 0.5–1.9)

## 2017-11-05 MED ORDER — MUPIROCIN 2 % EX OINT: 1.0000 "application " | TOPICAL_OINTMENT | Freq: Two times a day (BID) | CUTANEOUS | 0 refills | Status: DC

## 2017-11-05 NOTE — Evaluation (Signed)
Occupational Therapy Evaluation Patient Details Name: Nancy ItoCarey Bowers MRN: [id number] DOB: 04/10/1976 Today's Date: 11/05/2017    History of Present Illness 42 yo female s/p 3rd toe amputation 11/04/17 PMH bipolar    Clinical Impression   Patient is s/p 3rd toe amputation surgery resulting in functional limitations due to the deficits listed below (see OT problem list). Pt will require (A) for tub transfers and needs continued practice with RW for safety.  Patient will benefit from skilled OT acutely to increase independence and safety with ADLS to allow discharge home.     Follow Up Recommendations  No OT follow up    Equipment Recommendations  3 in 1 bedside commode;Other (comment)(rw WITH BIL PLATEFORM)    Recommendations for Other Services       Precautions / Restrictions Precautions Precautions: Fall Precaution Comments: supervision with RW and plateforms to decr L UE slipping off RW Restrictions Weight Bearing Restrictions: Yes LLE Weight Bearing: Non weight bearing Other Position/Activity Restrictions: elevate LLE when not ambulating      Mobility Bed Mobility               General bed mobility comments: in chair on arrival  Transfers Overall transfer level: Needs assistance Equipment used: Rolling walker (2 wheeled) Transfers: Sit to/from Stand Sit to Stand: Supervision         General transfer comment: cues for use of plateforms since this is new. pt with a hop and knee flexion in plateform walker    Balance Overall balance assessment: Mild deficits observed, not formally tested                                         ADL either performed or assessed with clinical judgement   ADL Overall ADL's : Needs assistance/impaired Eating/Feeding: Independent   Grooming: Wash/dry hands               Lower Body Dressing: Supervision/safety Lower Body Dressing Details (indicate cue type and reason): educated to dress surgery side  first  Toilet Transfer: Min Proofreaderguard;RW;BSC Toilet Transfer Details (indicate cue type and reason): educated on positioning and Insurance account managersafety       Tub/Shower Transfer Details (indicate cue type and reason): educatedon positioning of 3n1 in bathroom tub. pt reports we have one and it wont go in the tub with all 4 legs. pt educated on proper positioning with two legs out and provided handout in detail. after more discussion discovered that mother had a shower with a seat in an additional bathroom in the home. Pt educated on keeping the L LE dry and wrapping it to maintain dry. pt educated on using the outside of the tub to keep it dry if needed  Functional mobility during ADLs: Min guard General ADL Comments: pt experienced LOB with max (A) to correct in bathroom due to patient releasing RW and attempting to reach toward toilet. Pt attempting to flush toilet in standing with RW to the Left. OT helping maintain static standing and educated on RW safety in depth     Vision Baseline Vision/History: No visual deficits       Perception     Praxis      Pertinent Vitals/Pain Pain Assessment: No/denies pain     Hand Dominance Right   Extremity/Trunk Assessment Upper Extremity Assessment Upper Extremity Assessment: RUE deficits/detail;LUE deficits/detail RUE Deficits / Details: reports numbness in hands LUE Deficits /  Details: reports numbness in hands and decr grip. pt states uncertain if hand is going to slid off RW   Lower Extremity Assessment Lower Extremity Assessment: Defer to PT evaluation   Cervical / Trunk Assessment Cervical / Trunk Assessment: Other exceptions Cervical / Trunk Exceptions: hx of cervical deficits per patient   Communication Communication Communication: No difficulties   Cognition Arousal/Alertness: Awake/alert Behavior During Therapy: WFL for tasks assessed/performed Overall Cognitive Status: Impaired/Different from baseline Area of Impairment: Safety/judgement                          Safety/Judgement: Decreased awareness of safety;Decreased awareness of deficits     General Comments: pt requires cues to remain inside the RW and RW positioning   General Comments  dressing dry and intact    Exercises     Shoulder Instructions      Home Living Family/patient expects to be discharged to:: Private residence Living Arrangements: Spouse/significant other Available Help at Discharge: Family;Available 24 hours/day Type of Home: Mobile home Home Access: Stairs to enter Entrance Stairs-Number of Steps: 8 Entrance Stairs-Rails: Left Home Layout: One level     Bathroom Shower/Tub: Tub/shower unit;Curtain   Bathroom Toilet: Standard Bathroom Accessibility: No   Home Equipment: Environmental consultant - 2 wheels;Shower seat;Bedside commode   Additional Comments: lives with parents, spouse works, 2 yo son, mother has planned cervical surgery 2/21 here at [facility]      Prior Functioning/Environment Level of Independence: Independent        Comments: father will be plan (A) for home for two peopel until son gets home from school        OT Problem List: Decreased strength;Decreased activity tolerance;Impaired balance (sitting and/or standing);Decreased safety awareness;Decreased knowledge of use of DME or AE;Decreased knowledge of precautions;Pain      OT Treatment/Interventions: Self-care/ADL training;Therapeutic exercise;DME and/or AE instruction;Therapeutic activities;Patient/family education;Balance training    OT Goals(Current goals can be found in the care plan section) Acute Rehab OT Goals Patient Stated Goal: to return home OT Goal Formulation: With patient Time For Goal Achievement: 11/19/17 Potential to Achieve Goals: Good  OT Frequency: Min 2X/week   Barriers to D/C:            Co-evaluation              AM-PAC PT "6 Clicks" Daily Activity     Outcome Measure Help from another person eating meals?: None Help from another  person taking care of personal grooming?: None Help from another person toileting, which includes using toliet, bedpan, or urinal?: A Little Help from another person bathing (including washing, rinsing, drying)?: None Help from another person to put on and taking off regular upper body clothing?: None Help from another person to put on and taking off regular lower body clothing?: A Little 6 Click Score: 22   End of Session Equipment Utilized During Treatment: Gait belt;Back brace Nurse Communication: Mobility status;Precautions;Weight bearing status  Activity Tolerance: Patient tolerated treatment well Patient left: in chair;with call bell/phone within reach;with family/visitor present  OT Visit Diagnosis: Unsteadiness on feet (R26.81);Muscle weakness (generalized) (M62.81)                Time: 1610-9604 OT Time Calculation (min): 37 min Charges:  OT General Charges $OT Visit: 1 Visit OT Evaluation $OT Eval Moderate Complexity: 1 Mod OT Treatments $Self Care/Home Management : 8-22 mins G-Codes:      Mateo Flow   OTR/L Pager: [phone] Office: [phone] .  Boone Master B 11/05/2017, 11:49 AM

## 2017-11-05 NOTE — Progress Notes (Signed)
Patient ID: Nancy ItoCarey Ohair, female   DOB: 07/31/1976, 42 y.o.   MRN: [id number] Postoperative day one third ray amputation.  Patient has no complaints this morning she is resting comfortably.  Plan for physical therapy progressive ambulation nonweightbearing on the left.  Once she is with ambulation she may be discharged to home.

## 2017-11-05 NOTE — Progress Notes (Signed)
Richelle Itoarey Cawood to be D/C'd Home per MD order.  Discussed prescriptions and follow up appointments with the patient. Prescriptions given to patient, medication list explained in detail. Pt verbalized understanding.  Allergies as of 11/05/2017      Reactions   Aspirin Swelling   thoart   Fentanyl Rash   Bee Venom Hives   Tylenol [acetaminophen] Swelling   Tolerates percocet and norco      Medication List    STOP taking these medications   amoxicillin-clavulanate 875-125 MG tablet Commonly known as:  AUGMENTIN   ibuprofen 800 MG tablet Commonly known as:  ADVIL,MOTRIN     TAKE these medications   albuterol 108 (90 Base) MCG/ACT inhaler Commonly known as:  PROVENTIL HFA;VENTOLIN HFA Inhale 1-2 puffs into the lungs every 4 (four) hours as needed for wheezing or shortness of breath.   amphetamine-dextroamphetamine 30 MG tablet Commonly known as:  ADDERALL Take 1 tablet by mouth 2 (two) times daily.   cetirizine 10 MG tablet Commonly known as:  ZYRTEC Take 10 mg by mouth daily.   clonazePAM 0.5 MG tablet Commonly known as:  KLONOPIN Take 1 tablet (0.5 mg total) by mouth 4 (four) times daily as needed for anxiety.   ferrous sulfate 325 (65 FE) MG tablet Take 325 mg by mouth daily with breakfast.   fluticasone 50 MCG/ACT nasal spray Commonly known as:  FLONASE Place 1 spray into both nostrils daily as needed for allergies.   lamoTRIgine 200 MG tablet Commonly known as:  LAMICTAL Take 1 tablet (200 mg total) by mouth daily.   lidocaine 4 % cream Commonly known as:  LMX Apply 1 application topically daily as needed (pain relief).   lurasidone 80 MG Tabs tablet Commonly known as:  LATUDA Take 1 tablet (80 mg total) by mouth daily before supper.   metaxalone 800 MG tablet Commonly known as:  SKELAXIN TAKE 1 TABLET BY ORAL TWICE DAILY.   mupirocin ointment 2 % Commonly known as:  BACTROBAN Place 1 application into the nose 2 (two) times daily.    oxyCODONE-acetaminophen 10-325 MG tablet Commonly known as:  PERCOCET Take 1-2 tablets by mouth every 4 (four) hours as needed for pain. Reported on 10/26/2015            Durable Medical Equipment  (From admission, onward)        Start     Ordered   11/05/17 1118  For home use only DME Walker rolling  Once    Comments:  Needs right and left platforms  Question:  Patient needs a walker to treat with the following condition  Answer:  S/P amputation of lesser toe, left (HCC)   11/05/17 1118   11/05/17 0958  For home use only DME 3 n 1  Once     11/05/17 0959      Vitals:   11/05/17 0405 11/05/17 1256  BP: 115/66 113/61  Pulse: 68 80  Resp: 17 18  Temp: 98.6 F (37 C) 98 F (36.7 C)  SpO2: 98% 100%    Skin clean, dry and intact without evidence of skin break down, no evidence of skin tears noted. Left foot wrapped in coban compression wrap, clean, dry, and intact. IV catheter discontinued intact. Site without signs and symptoms of complications. Dressing and pressure applied. Pt denies pain at this time. No complaints noted.  An After Visit Summary and prescriptions were printed and given to the patient. Patient escorted via WC, and D/C home via private auto.  [place] Nitesh Pitstick RN

## 2017-11-05 NOTE — Care Management Note (Signed)
Case Management Note  Patient Details  Name: Nancy Bowers MRN: [id number] Date of Birth: 07/22/1976  Subjective/Objective:    42 yr old female s/p amputation of left 3rd toe.          Action/Plan: Case manager spoke with patient concerning discharge plan and DME. Patient has been setup with [facility] for Home Health therapy. She will have family support at discharge.    Expected Discharge Date:   11/05/17               Expected Discharge Plan:  Home w Home Health Services  In-House Referral:  NA  Discharge planning Services  CM Consult  Post Acute Care Choice:  Durable Medical Equipment, Home Health Choice offered to:  Patient  DME Arranged:  3-N-1, Walker rolling, Scientific laboratory technicianWalker platform DME Agency:  [facility]  HH Arranged:  PT HH Agency:  [facility]  Status of Service:  Completed, signed off  If discussed at Long Length of Stay Meetings, dates discussed:    Additional Comments:  Durenda GuthrieBrady, Beda Dula Naomi, RN 11/05/2017, 12:00 PM

## 2017-11-05 NOTE — Progress Notes (Signed)
Physical Therapy Treatment Patient Details Name: Nancy Bowers MRN: [id number] DOB: 07/27/1976 Today's Date: 11/05/2017    History of Present Illness 42 yo female s/p 3rd toe amputation 11/04/17 PMH bipolar     PT Comments    Pt progressing towards physical therapy goals. Was able to perform transfers and ambulation with gross supervision to min guard assist for balance support and safety. Chair follow for seated rest breaks if needed. Pt took several standing rest breaks due to wrist pain and maintained NWB status well. Will need to practice stairs next session in preparation for d/c home.   Follow Up Recommendations  Home health PT     Equipment Recommendations  Rolling walker with 5" wheels    Recommendations for Other Services       Precautions / Restrictions Precautions Precautions: Fall Restrictions Weight Bearing Restrictions: Yes LLE Weight Bearing: Non weight bearing Other Position/Activity Restrictions: elevate LLE when not ambulating    Mobility  Bed Mobility               General bed mobility comments: in chair on arrival  Transfers Overall transfer level: Needs assistance Equipment used: Rolling walker (2 wheeled) Transfers: Sit to/from Stand Sit to Stand: Supervision         General transfer comment: VC's for hand placement on seated surface for safety.   Ambulation/Gait Ambulation/Gait assistance: Min guard Ambulation Distance (Feet): 50 Feet Assistive device: Rolling walker (2 wheeled) Gait Pattern/deviations: Step-to pattern Gait velocity: Decreased Gait velocity interpretation: Below normal speed for age/gender General Gait Details: Weakness and decr sensation in hands coupled with neck problems creates incr challenge using RW for NWB LLE, therefore pt has limited distance tolerance before shoulders/wrists hurt, able to counter with standing rest breaks and cover short distances otherwise without physical assistance. Chair follow  utilized.   Stairs            Wheelchair Mobility    Modified Rankin (Stroke Patients Only)       Balance Overall balance assessment: Mild deficits observed, not formally tested                                          Cognition Arousal/Alertness: Awake/alert Behavior During Therapy: WFL for tasks assessed/performed Overall Cognitive Status: Impaired/Different from baseline Area of Impairment: Safety/judgement                         Safety/Judgement: Decreased awareness of safety;Decreased awareness of deficits     General Comments: pt requires cues to remain inside the RW and RW positioning      Exercises      General Comments General comments (skin integrity, edema, etc.): Pt educated on positioning and elevating LE on pillows at end of session. When returned with OT short time later, pt had rearranged pillows so that they were under the knee and not providing the adequate amount of elevation.       Pertinent Vitals/Pain Pain Assessment: Faces Faces Pain Scale: Hurts even more Pain Location: LLE in dependent position Pain Descriptors / Indicators: Operative site guarding;Aching Pain Intervention(s): Limited activity within patient's tolerance;Monitored during session;Repositioned;Patient requesting pain meds-RN notified    Home Living                      Prior Function  PT Goals (current goals can now be found in the care plan section) Acute Rehab PT Goals Patient Stated Goal: to return home PT Goal Formulation: With patient Time For Goal Achievement: 11/18/17 Potential to Achieve Goals: Good Progress towards PT goals: Progressing toward goals    Frequency    Min 5X/week      PT Plan Current plan remains appropriate    Co-evaluation              AM-PAC PT "6 Clicks" Daily Activity  Outcome Measure  Difficulty turning over in bed (including adjusting bedclothes, sheets and blankets)?:  A Little Difficulty moving from lying on back to sitting on the side of the bed? : A Little Difficulty sitting down on and standing up from a chair with arms (e.g., wheelchair, bedside commode, etc,.)?: A Little Help needed moving to and from a bed to chair (including a wheelchair)?: A Little Help needed walking in hospital room?: A Little Help needed climbing 3-5 steps with a railing? : A Lot 6 Click Score: 17    End of Session Equipment Utilized During Treatment: Gait belt Activity Tolerance: Patient limited by fatigue Patient left: in chair;with call bell/phone within reach Nurse Communication: Mobility status PT Visit Diagnosis: Unsteadiness on feet (R26.81);Pain Pain - Right/Left: Left Pain - part of body: Ankle and joints of foot     Time: 0937-1000 PT Time Calculation (min) (ACUTE ONLY): 23 min  Charges:  $Gait Training: 23-37 mins                    G Codes:       Nancy Bowers, PT, DPT Acute Rehabilitation Services Pager: [phone]    Nancy Bowers 11/05/2017, 3:11 PM

## 2017-11-05 NOTE — Discharge Summary (Signed)
Physician Discharge Summary  Patient ID: Nancy Bowers MRN: [id number] DOB/AGE: 1975-12-26 42 y.o.  Admit date: 11/03/2017 Discharge date: 11/05/2017  Admission Diagnoses:  Discharge Diagnoses:  Principal Problem:   Cellulitis of left foot Active Problems:   TOBACCO ABUSE   Bipolar 1 disorder (HCC)   Cellulitis of toe of left foot   Discharged Condition: stable  Brief Narrative: Patient is a 42 year old female past medical history significant forbipolar disorder.  Patient presented with worsening redness, swelling, drainage, and pain at the left foot despite antibiotics for cellulitis. Patient reported that she was walking barefoot in her home approximately 3 weeks ago, stepped on a sharp piece of plastic, suffered a tiny laceration at the base of her left third toe, and subsequently went on to develop some erythema, swelling, and tenderness at the site. She was evaluated in the emergency department on 10/30/2017, had no systemic signs of infection, was sent home with Augmentin and analgesic. She reported strict adherence with the medication, but was continued about worsening swelling and erythema of the area, and progressing to involve the dorsal foot. Patient also had new purulent drainage from the base of the third toe. She reported associated chills, but no fever.   Hospital Course:   1.Cellulitis of left footw abscess s/p Left 3rd ray amputation 2/17: Patient was admitted and started on broad-spectrum antibiotics.  Orthopedic team was consulted to assist with the patient's care.  Patient was seen by Dr. Lajoyce Corners.  Dr. Lajoyce Corners proceeded with ray amputation of the left third toe.  Case was discussed with Dr. Lajoyce Corners prior to discharge, the patient will not need antibiotics on discharge.  The plan is for the patient to follow with Dr. Lajoyce Corners in 2 weeks.  The patient is eager to be discharged back home.  CT scan was negative for underlying osseous abnormalities.  2.Bipolar disorder -Stable,  no SI, HI, or hallucinations -Continued Lamictal, Latuda, and Klonopin  3.Current smoker -Smokes 1/2-1 ppd - Counseled toward cessation -Not ready to quit -Nicotine patch provided during the hospital stay.  Consults: orthopedic surgery  Treatments: surgery: Ray amputation of the left fourth toe.  Discharge Exam: Blood pressure 113/61, pulse 80, temperature 98 F (36.7 C), temperature source Oral, resp. rate 18, last menstrual period 10/22/2017, SpO2 100 %.   Disposition: 01-Home or Self Care  Discharge Instructions    Call MD for:   Complete by:  As directed    Please call MD if symptoms worsen.   Diet - low sodium heart healthy   Complete by:  As directed    Increase activity slowly   Complete by:  As directed      Allergies as of 11/05/2017      Reactions   Aspirin Swelling   thoart   Fentanyl Rash   Bee Venom Hives   Tylenol [acetaminophen] Swelling   Tolerates percocet and norco      Medication List    STOP taking these medications   amoxicillin-clavulanate 875-125 MG tablet Commonly known as:  AUGMENTIN   ibuprofen 800 MG tablet Commonly known as:  ADVIL,MOTRIN     TAKE these medications   albuterol 108 (90 Base) MCG/ACT inhaler Commonly known as:  PROVENTIL HFA;VENTOLIN HFA Inhale 1-2 puffs into the lungs every 4 (four) hours as needed for wheezing or shortness of breath.   amphetamine-dextroamphetamine 30 MG tablet Commonly known as:  ADDERALL Take 1 tablet by mouth 2 (two) times daily.   cetirizine 10 MG tablet Commonly known as:  ZYRTEC Take  10 mg by mouth daily.   clonazePAM 0.5 MG tablet Commonly known as:  KLONOPIN Take 1 tablet (0.5 mg total) by mouth 4 (four) times daily as needed for anxiety.   ferrous sulfate 325 (65 FE) MG tablet Take 325 mg by mouth daily with breakfast.   fluticasone 50 MCG/ACT nasal spray Commonly known as:  FLONASE Place 1 spray into both nostrils daily as needed for allergies.   lamoTRIgine 200  MG tablet Commonly known as:  LAMICTAL Take 1 tablet (200 mg total) by mouth daily.   lidocaine 4 % cream Commonly known as:  LMX Apply 1 application topically daily as needed (pain relief).   lurasidone 80 MG Tabs tablet Commonly known as:  LATUDA Take 1 tablet (80 mg total) by mouth daily before supper.   metaxalone 800 MG tablet Commonly known as:  SKELAXIN TAKE 1 TABLET BY ORAL TWICE DAILY.   mupirocin ointment 2 % Commonly known as:  BACTROBAN Place 1 application into the nose 2 (two) times daily.   oxyCODONE-acetaminophen 10-325 MG tablet Commonly known as:  PERCOCET Take 1-2 tablets by mouth every 4 (four) hours as needed for pain. Reported on 10/26/2015            Durable Medical Equipment  (From admission, onward)        Start     Ordered   11/05/17 1118  For home use only DME Walker rolling  Once    Comments:  Needs right and left platforms  Question:  Patient needs a walker to treat with the following condition  Answer:  S/P amputation of lesser toe, left (HCC)   11/05/17 1118   11/05/17 0958  For home use only DME 3 n 1  Once     11/05/17 [id number]     Follow-up Information    Nadara Mustarduda, Marcus V, MD Follow up in 1 week(s).   Specialty:  Orthopedic Surgery Contact information: [address] [place] [address] [phone]           Signed: Barnetta ChapelSylvester I Orlin Kann 11/05/2017, 3:09 PM

## 2017-11-06 ENCOUNTER — Telehealth (INDEPENDENT_AMBULATORY_CARE_PROVIDER_SITE_OTHER): Payer: Self-pay | Admitting: Orthopedic Surgery

## 2017-11-06 NOTE — Telephone Encounter (Signed)
Patients father called wondering about pain medication for her, states she wasn't given anything for pain at discharge and that her foot is hurting. Confirmed [facility] as preferred location. Please advise # [phone]

## 2017-11-06 NOTE — Telephone Encounter (Signed)
Pt is s/p a third ray amputation. I called and confirmed she is taking Percocet 10/325 4 times a day. She states that she is having pain and has fallen a few times that she lives in a trailer and has several dogs and that she is not able to ambulate in the home without falling. She states that she is having increased pain. I advised that due to the potential for injury to her surgical site she needs to be seen in the office appt was made for tomorrow and will check surgical site and address pain concerns in the office. Of note she does get the percocet 10/325 mg rx given monthly.

## 2017-11-07 ENCOUNTER — Telehealth (INDEPENDENT_AMBULATORY_CARE_PROVIDER_SITE_OTHER): Payer: Self-pay | Admitting: Orthopedic Surgery

## 2017-11-07 ENCOUNTER — Ambulatory Visit (INDEPENDENT_AMBULATORY_CARE_PROVIDER_SITE_OTHER): Payer: Medicaid Other | Admitting: Orthopedic Surgery

## 2017-11-07 ENCOUNTER — Encounter (INDEPENDENT_AMBULATORY_CARE_PROVIDER_SITE_OTHER): Payer: Self-pay | Admitting: Orthopedic Surgery

## 2017-11-07 ENCOUNTER — Encounter: Payer: Self-pay | Admitting: Physical Therapy

## 2017-11-07 VITALS — Ht 65.0 in | Wt 138.0 lb

## 2017-11-07 DIAGNOSIS — Z89422 Acquired absence of other left toe(s): Secondary | ICD-10-CM
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
## 2017-11-07 NOTE — Telephone Encounter (Signed)
Jerylnn called from [facility] asking for verbal approval for Home Health PT 2 week 2 and 1 week 2. CB # [phone]

## 2017-11-07 NOTE — Telephone Encounter (Signed)
I called and gave verbal ok for physical therapy.

## 2017-11-09 ENCOUNTER — Telehealth (INDEPENDENT_AMBULATORY_CARE_PROVIDER_SITE_OTHER): Payer: Self-pay | Admitting: Radiology

## 2017-11-09 NOTE — Telephone Encounter (Signed)
Patients husband called stating she was seen in office on 11/07/17 post left middle toe amputation. Stated they cleaned foot today, looked as if incision had started to open, states little to no blood. Scheduled wound check for patient on 2/26, instructed patient to go to ER if having any increased bleeding or pain before appointment. Patient would still like a call Monday morning [phone]

## 2017-11-12 ENCOUNTER — Encounter: Payer: Self-pay | Admitting: Physical Therapy

## 2017-11-12 NOTE — Telephone Encounter (Signed)
I called Nancy Bowers and she confirms that she is keeping the foot elevated and not walking on her foot but does state that "my dogs keep stepping on this thing" Nancy Bowers has an appt for eval tomorrow with Dr. Lajoyce Cornersuda will call with questions.

## 2017-11-12 NOTE — Telephone Encounter (Signed)
See Autumn's notation of today's phone call with the patient.

## 2017-11-13 ENCOUNTER — Ambulatory Visit (INDEPENDENT_AMBULATORY_CARE_PROVIDER_SITE_OTHER): Payer: Medicaid Other | Admitting: Orthopedic Surgery

## 2017-11-13 ENCOUNTER — Encounter (INDEPENDENT_AMBULATORY_CARE_PROVIDER_SITE_OTHER): Payer: Self-pay | Admitting: Orthopedic Surgery

## 2017-11-13 VITALS — Ht 65.0 in | Wt 138.0 lb

## 2017-11-13 DIAGNOSIS — Z89422 Acquired absence of other left toe(s): Secondary | ICD-10-CM

## 2017-11-13 MED ORDER — MUPIROCIN 2 % EX OINT: 1.0000 "application " | TOPICAL_OINTMENT | Freq: Two times a day (BID) | CUTANEOUS | 3 refills | Status: DC

## 2017-11-13 MED ORDER — TRAMADOL HCL 50 MG PO TABS: 50.0000 mg | ORAL_TABLET | Freq: Four times a day (QID) | ORAL | 0 refills | Status: DC | PRN
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
## 2017-11-14 ENCOUNTER — Encounter: Payer: Self-pay | Admitting: Physical Therapy

## 2017-11-15 ENCOUNTER — Other Ambulatory Visit: Payer: Self-pay | Admitting: Internal Medicine

## 2017-11-15 ENCOUNTER — Inpatient Hospital Stay (INDEPENDENT_AMBULATORY_CARE_PROVIDER_SITE_OTHER): Payer: Medicaid Other | Admitting: Orthopedic Surgery

## 2017-11-15 ENCOUNTER — Encounter (INDEPENDENT_AMBULATORY_CARE_PROVIDER_SITE_OTHER): Payer: Self-pay

## 2017-11-15 DIAGNOSIS — Z139 Encounter for screening, unspecified: Secondary | ICD-10-CM

## 2017-11-19 ENCOUNTER — Encounter (INDEPENDENT_AMBULATORY_CARE_PROVIDER_SITE_OTHER): Payer: Self-pay | Admitting: Orthopedic Surgery

## 2017-11-19 ENCOUNTER — Ambulatory Visit (INDEPENDENT_AMBULATORY_CARE_PROVIDER_SITE_OTHER): Payer: Medicaid Other | Admitting: Orthopedic Surgery

## 2017-11-19 VITALS — Ht 65.0 in | Wt 138.0 lb

## 2017-11-19 DIAGNOSIS — Z89422 Acquired absence of other left toe(s): Secondary | ICD-10-CM

## 2017-11-19 MED ORDER — OXYCODONE-ACETAMINOPHEN 10-325 MG PO TABS: 1.0000 | ORAL_TABLET | Freq: Three times a day (TID) | ORAL | 0 refills | Status: DC | PRN
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
## 2017-11-21 ENCOUNTER — Ambulatory Visit (INDEPENDENT_AMBULATORY_CARE_PROVIDER_SITE_OTHER): Payer: Medicaid Other | Admitting: Orthopedic Surgery

## 2017-11-26 ENCOUNTER — Encounter (INDEPENDENT_AMBULATORY_CARE_PROVIDER_SITE_OTHER): Payer: Self-pay | Admitting: Orthopedic Surgery

## 2017-11-26 ENCOUNTER — Ambulatory Visit (INDEPENDENT_AMBULATORY_CARE_PROVIDER_SITE_OTHER): Payer: Medicaid Other | Admitting: Orthopedic Surgery

## 2017-11-26 VITALS — Ht 65.0 in | Wt 138.0 lb

## 2017-11-26 DIAGNOSIS — Z89422 Acquired absence of other left toe(s): Secondary | ICD-10-CM

## 2017-11-26 MED ORDER — DOXYCYCLINE HYCLATE 100 MG PO TABS: 100.0000 mg | ORAL_TABLET | Freq: Two times a day (BID) | ORAL | 0 refills | Status: DC
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
## 2017-12-03 ENCOUNTER — Ambulatory Visit (INDEPENDENT_AMBULATORY_CARE_PROVIDER_SITE_OTHER): Payer: Medicaid Other | Admitting: Orthopedic Surgery

## 2017-12-03 ENCOUNTER — Encounter (INDEPENDENT_AMBULATORY_CARE_PROVIDER_SITE_OTHER): Payer: Self-pay | Admitting: Orthopedic Surgery

## 2017-12-03 VITALS — Ht 65.0 in | Wt 138.0 lb

## 2017-12-03 DIAGNOSIS — Z89422 Acquired absence of other left toe(s): Secondary | ICD-10-CM
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
## 2017-12-05 ENCOUNTER — Ambulatory Visit
Admission: RE | Admit: 2017-12-05 | Discharge: 2017-12-05 | Disposition: A | Discharge status: Home / Self Care | Payer: Medicaid Other | Source: Ambulatory Visit | Attending: Internal Medicine | Admitting: Internal Medicine

## 2017-12-05 DIAGNOSIS — Z139 Encounter for screening, unspecified: Secondary | ICD-10-CM

## 2017-12-18 ENCOUNTER — Ambulatory Visit (INDEPENDENT_AMBULATORY_CARE_PROVIDER_SITE_OTHER): Payer: Medicaid Other | Admitting: Orthopedic Surgery

## 2017-12-18 ENCOUNTER — Encounter (INDEPENDENT_AMBULATORY_CARE_PROVIDER_SITE_OTHER): Payer: Self-pay | Admitting: Orthopedic Surgery

## 2017-12-18 VITALS — Ht 65.0 in | Wt 138.0 lb

## 2017-12-18 DIAGNOSIS — Z89422 Acquired absence of other left toe(s): Secondary | ICD-10-CM
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
## 2017-12-20 ENCOUNTER — Ambulatory Visit (HOSPITAL_COMMUNITY): Payer: Self-pay | Admitting: Psychiatry

## 2017-12-24 ENCOUNTER — Telehealth (HOSPITAL_COMMUNITY): Payer: Self-pay

## 2017-12-24 NOTE — Telephone Encounter (Signed)
Patient called requesting a refill on all her medications that are prescribed by you. She had recently been in the hospital due to amputation of one of her left toes. Please advise

## 2017-12-25 ENCOUNTER — Other Ambulatory Visit (HOSPITAL_COMMUNITY): Payer: Self-pay | Admitting: Psychiatry

## 2017-12-25 DIAGNOSIS — F411 Generalized anxiety disorder: Secondary | ICD-10-CM

## 2017-12-25 DIAGNOSIS — F319 Bipolar disorder, unspecified: Secondary | ICD-10-CM

## 2017-12-26 ENCOUNTER — Other Ambulatory Visit (HOSPITAL_COMMUNITY): Payer: Self-pay | Admitting: Psychiatry

## 2017-12-26 ENCOUNTER — Other Ambulatory Visit (HOSPITAL_COMMUNITY): Payer: Self-pay

## 2017-12-26 DIAGNOSIS — F319 Bipolar disorder, unspecified: Secondary | ICD-10-CM

## 2017-12-26 DIAGNOSIS — F411 Generalized anxiety disorder: Secondary | ICD-10-CM

## 2017-12-26 MED ORDER — LURASIDONE HCL 80 MG PO TABS: 80.0000 mg | ORAL_TABLET | Freq: Every day | ORAL | 0 refills | Status: DC

## 2017-12-26 MED ORDER — CLONAZEPAM 0.5 MG PO TABS: 0.5000 mg | ORAL_TABLET | Freq: Four times a day (QID) | ORAL | 0 refills | Status: DC | PRN

## 2017-12-26 MED ORDER — LAMOTRIGINE 200 MG PO TABS: 200.0000 mg | ORAL_TABLET | Freq: Every day | ORAL | 0 refills | Status: DC

## 2017-12-26 NOTE — Telephone Encounter (Signed)
Patient called again today and I rescheduled her for May - per protocol,  I sent a 30 day supply of all medication except the Adderall, patient said she would be okay until tomorrow when you could review and send. Thank you

## 2017-12-27 ENCOUNTER — Telehealth (HOSPITAL_COMMUNITY): Payer: Self-pay

## 2017-12-27 ENCOUNTER — Other Ambulatory Visit (HOSPITAL_COMMUNITY): Payer: Self-pay | Admitting: Psychiatry

## 2017-12-27 DIAGNOSIS — F902 Attention-deficit hyperactivity disorder, combined type: Secondary | ICD-10-CM

## 2017-12-27 MED ORDER — AMPHETAMINE-DEXTROAMPHETAMINE 30 MG PO TABS: 30.0000 mg | ORAL_TABLET | Freq: Two times a day (BID) | ORAL | 0 refills | Status: DC

## 2017-12-27 NOTE — Telephone Encounter (Signed)
done

## 2017-12-27 NOTE — Telephone Encounter (Signed)
Patient called to reschedule her appointment, she will be coming in next month. I refilled all meds for 30 days, would you mind sending in her Adderall? Pharmacy in the chart is correct, thank you

## 2018-01-07 ENCOUNTER — Other Ambulatory Visit: Payer: Self-pay | Admitting: Neurosurgery

## 2018-01-07 DIAGNOSIS — M4712 Other spondylosis with myelopathy, cervical region: Secondary | ICD-10-CM

## 2018-01-13 ENCOUNTER — Other Ambulatory Visit: Payer: Self-pay

## 2018-01-13 ENCOUNTER — Ambulatory Visit
Admission: RE | Admit: 2018-01-13 | Discharge: 2018-01-13 | Disposition: A | Discharge status: Home / Self Care | Payer: Medicaid Other | Source: Ambulatory Visit | Attending: Neurosurgery | Admitting: Neurosurgery

## 2018-01-13 DIAGNOSIS — M4712 Other spondylosis with myelopathy, cervical region: Secondary | ICD-10-CM

## 2018-01-24 ENCOUNTER — Other Ambulatory Visit (HOSPITAL_COMMUNITY): Payer: Self-pay

## 2018-01-24 ENCOUNTER — Telehealth (HOSPITAL_COMMUNITY): Payer: Self-pay

## 2018-01-24 ENCOUNTER — Other Ambulatory Visit (HOSPITAL_COMMUNITY): Payer: Self-pay | Admitting: Psychiatry

## 2018-01-24 DIAGNOSIS — F319 Bipolar disorder, unspecified: Secondary | ICD-10-CM

## 2018-01-24 DIAGNOSIS — F411 Generalized anxiety disorder: Secondary | ICD-10-CM

## 2018-01-24 DIAGNOSIS — F902 Attention-deficit hyperactivity disorder, combined type: Secondary | ICD-10-CM

## 2018-01-24 MED ORDER — AMPHETAMINE-DEXTROAMPHETAMINE 30 MG PO TABS: 30.0000 mg | ORAL_TABLET | Freq: Two times a day (BID) | ORAL | 0 refills | Status: DC

## 2018-01-24 MED ORDER — CLONAZEPAM 0.5 MG PO TABS: 0.5000 mg | ORAL_TABLET | Freq: Four times a day (QID) | ORAL | 0 refills | Status: DC | PRN

## 2018-01-24 MED ORDER — LAMOTRIGINE 200 MG PO TABS: 200.0000 mg | ORAL_TABLET | Freq: Every day | ORAL | 0 refills | Status: DC

## 2018-01-24 MED ORDER — LURASIDONE HCL 80 MG PO TABS: 80.0000 mg | ORAL_TABLET | Freq: Every day | ORAL | 0 refills | Status: DC

## 2018-01-24 NOTE — Telephone Encounter (Signed)
Patient is calling for refills, she has an appointment on the 16th, but will be out of all meds on Friday. I sent a 30 day supply in of everything but the Adderall. Patient uses [facility]

## 2018-01-31 ENCOUNTER — Ambulatory Visit (HOSPITAL_COMMUNITY): Payer: Self-pay | Admitting: Psychiatry
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
## 2018-02-14 ENCOUNTER — Encounter (HOSPITAL_COMMUNITY): Payer: Self-pay | Admitting: Psychiatry

## 2018-02-14 ENCOUNTER — Ambulatory Visit (INDEPENDENT_AMBULATORY_CARE_PROVIDER_SITE_OTHER): Payer: Medicaid Other | Admitting: Psychiatry

## 2018-02-14 DIAGNOSIS — F902 Attention-deficit hyperactivity disorder, combined type: Secondary | ICD-10-CM

## 2018-02-14 DIAGNOSIS — F411 Generalized anxiety disorder: Secondary | ICD-10-CM

## 2018-02-14 DIAGNOSIS — F319 Bipolar disorder, unspecified: Secondary | ICD-10-CM | POA: Diagnosis not present

## 2018-02-14 MED ORDER — CLONAZEPAM 0.5 MG PO TABS: 0.5000 mg | ORAL_TABLET | Freq: Four times a day (QID) | ORAL | 1 refills | Status: DC | PRN

## 2018-02-14 MED ORDER — LAMOTRIGINE 200 MG PO TABS: 200.0000 mg | ORAL_TABLET | Freq: Every day | ORAL | 1 refills | Status: DC

## 2018-02-14 MED ORDER — LURASIDONE HCL 80 MG PO TABS: 80.0000 mg | ORAL_TABLET | Freq: Every day | ORAL | 1 refills | Status: DC

## 2018-02-14 MED ORDER — AMPHETAMINE-DEXTROAMPHET ER 15 MG PO CP24: ORAL_CAPSULE | ORAL | 0 refills | Status: DC
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
## 2018-02-19 ENCOUNTER — Other Ambulatory Visit: Payer: Self-pay | Admitting: Neurosurgery

## 2018-02-19 DIAGNOSIS — M4712 Other spondylosis with myelopathy, cervical region: Secondary | ICD-10-CM

## 2018-02-27 ENCOUNTER — Ambulatory Visit
Admission: RE | Admit: 2018-02-27 | Discharge: 2018-02-27 | Disposition: A | Discharge status: Home / Self Care | Payer: Self-pay | Source: Ambulatory Visit | Attending: Neurosurgery | Admitting: Neurosurgery

## 2018-02-27 VITALS — BP 162/107 | HR 77

## 2018-02-27 DIAGNOSIS — M4712 Other spondylosis with myelopathy, cervical region: Secondary | ICD-10-CM

## 2018-02-27 MED ORDER — ONDANSETRON HCL 4 MG/2ML IJ SOLN: 4.0000 mg | Freq: Four times a day (QID) | INTRAMUSCULAR | Status: DC | PRN

## 2018-02-27 MED ORDER — IOPAMIDOL (ISOVUE-M 300) INJECTION 61%
10.0000 mL | Freq: Once | INTRAMUSCULAR | Status: AC | PRN
  Administered 2018-02-27: 10 mL via INTRATHECAL

## 2018-02-27 MED ORDER — DIAZEPAM 5 MG PO TABS
10.0000 mg | ORAL_TABLET | Freq: Once | ORAL | Status: AC
  Administered 2018-02-27: 10 mg via ORAL

## 2018-02-27 NOTE — Discharge Instructions (Signed)

## 2018-03-20 ENCOUNTER — Other Ambulatory Visit (HOSPITAL_COMMUNITY): Payer: Self-pay | Admitting: Psychiatry

## 2018-03-20 DIAGNOSIS — F411 Generalized anxiety disorder: Secondary | ICD-10-CM

## 2018-03-20 DIAGNOSIS — F319 Bipolar disorder, unspecified: Secondary | ICD-10-CM

## 2018-04-03 ENCOUNTER — Other Ambulatory Visit: Payer: Self-pay | Admitting: Neurosurgery

## 2018-04-08 ENCOUNTER — Other Ambulatory Visit (HOSPITAL_COMMUNITY): Payer: Self-pay | Admitting: Psychiatry

## 2018-04-08 DIAGNOSIS — F319 Bipolar disorder, unspecified: Secondary | ICD-10-CM

## 2018-04-11 ENCOUNTER — Ambulatory Visit (INDEPENDENT_AMBULATORY_CARE_PROVIDER_SITE_OTHER): Payer: Medicaid Other | Admitting: Psychiatry

## 2018-04-11 ENCOUNTER — Encounter (HOSPITAL_COMMUNITY): Payer: Self-pay | Admitting: Psychiatry

## 2018-04-11 DIAGNOSIS — Z79899 Other long term (current) drug therapy: Secondary | ICD-10-CM

## 2018-04-11 DIAGNOSIS — F319 Bipolar disorder, unspecified: Secondary | ICD-10-CM | POA: Diagnosis not present

## 2018-04-11 DIAGNOSIS — F411 Generalized anxiety disorder: Secondary | ICD-10-CM | POA: Diagnosis not present

## 2018-04-11 MED ORDER — CLONAZEPAM 0.5 MG PO TABS: 0.5000 mg | ORAL_TABLET | Freq: Four times a day (QID) | ORAL | 1 refills | Status: DC | PRN

## 2018-04-11 MED ORDER — LURASIDONE HCL 80 MG PO TABS: 80.0000 mg | ORAL_TABLET | Freq: Every evening | ORAL | 1 refills | Status: DC

## 2018-04-11 MED ORDER — LAMOTRIGINE 100 MG PO TABS: 250.0000 mg | ORAL_TABLET | Freq: Every day | ORAL | 1 refills | Status: DC
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
## 2018-04-12 NOTE — Pre-Procedure Instructions (Signed)
Richelle ItoCarey Deluna  04/12/2018      [facility] - [place], [place] - [address] [place] [address] Phone: [phone] Fax: [phone]    Your procedure is scheduled on Friday August 2.  Report to [facility] Admitting at 9:30 A.M.  Call this number if you have problems the morning of surgery:  [phone]   Remember:  Do not eat or drink after midnight.    Take these medicines the morning of surgery with A SIP OF WATER:   Albuterol inhaler if needed (please bring inhaler to hospital with you) Cetirizine (zyrtec) Knlonopin if needed flonase if needed Gabapentin (neurontin) Lamotrigine (lamictal) Oxycodone (percocet) if needed  7 days prior to surgery STOP taking any Aspirin(unless otherwise instructed by your surgeon), Aleve, Naproxen, Ibuprofen, Motrin, Advil, Goody's, BC's, all herbal medications, fish oil, and all vitamins     Do not wear jewelry, make-up or nail polish.  Do not wear lotions, powders, or perfumes, or deodorant.  Do not shave 48 hours prior to surgery.  Men may shave face and neck.  Do not bring valuables to the hospital.  [facility] is not responsible for any belongings or valuables.  Contacts, dentures or bridgework may not be worn into surgery.  Leave your suitcase in the car.  After surgery it may be brought to your room.  For patients admitted to the hospital, discharge time will be determined by your treatment team.  Patients discharged the day of surgery will not be allowed to drive home.   Special instructions:    [place]- Preparing For Surgery  Before surgery, you can play an important role. Because skin is not sterile, your skin needs to be as free of germs as possible. You can reduce the number of germs on your skin by washing with CHG (chlorahexidine gluconate) Soap before surgery.  CHG is an antiseptic cleaner which kills germs and bonds with the skin to continue killing germs  even after washing.    Oral Hygiene is also important to reduce your risk of infection.  Remember - BRUSH YOUR TEETH THE MORNING OF SURGERY WITH YOUR REGULAR TOOTHPASTE  Please do not use if you have an allergy to CHG or antibacterial soaps. If your skin becomes reddened/irritated stop using the CHG.  Do not shave (including legs and underarms) for at least 48 hours prior to first CHG shower. It is OK to shave your face.  Please follow these instructions carefully.   1. Shower the NIGHT BEFORE SURGERY and the MORNING OF SURGERY with CHG.   2. If you chose to wash your hair, wash your hair first as usual with your normal shampoo.  3. After you shampoo, rinse your hair and body thoroughly to remove the shampoo.  4. Use CHG as you would any other liquid soap. You can apply CHG directly to the skin and wash gently with a scrungie or a clean washcloth.   5. Apply the CHG Soap to your body ONLY FROM THE NECK DOWN.  Do not use on open wounds or open sores. Avoid contact with your eyes, ears, mouth and genitals (private parts). Wash Face and genitals (private parts)  with your normal soap.  6. Wash thoroughly, paying special attention to the area where your surgery will be performed.  7. Thoroughly rinse your body with warm water from the neck down.  8. DO NOT shower/wash with your normal soap after using and rinsing off the  CHG Soap.  9. Pat yourself dry with a CLEAN TOWEL.  10. Wear CLEAN PAJAMAS to bed the night before surgery, wear comfortable clothes the morning of surgery  11. Place CLEAN SHEETS on your bed the night of your first shower and DO NOT SLEEP WITH PETS.    Day of Surgery:  Do not apply any deodorants/lotions.  Please wear clean clothes to the hospital/surgery center.   Remember to brush your teeth WITH YOUR REGULAR TOOTHPASTE.    Please read over the following fact sheets that you were given. Coughing and Deep Breathing, MRSA Information and Surgical Site Infection  Prevention

## 2018-04-15 ENCOUNTER — Encounter (HOSPITAL_COMMUNITY): Payer: Self-pay

## 2018-04-15 ENCOUNTER — Other Ambulatory Visit: Payer: Self-pay

## 2018-04-15 ENCOUNTER — Encounter (HOSPITAL_COMMUNITY)
Admission: RE | Admit: 2018-04-15 | Discharge: 2018-04-15 | Disposition: A | Discharge status: Home / Self Care | Payer: Medicaid Other | Source: Ambulatory Visit | Attending: Neurosurgery | Admitting: Neurosurgery

## 2018-04-15 DIAGNOSIS — Z01812 Encounter for preprocedural laboratory examination: Secondary | ICD-10-CM | POA: Diagnosis present

## 2018-04-15 HISTORY — DX: Anemia, unspecified: D64.9

## 2018-04-15 HISTORY — DX: Fatty (change of) liver, not elsewhere classified: K76.0

## 2018-04-15 HISTORY — DX: Depression, unspecified: F32.A

## 2018-04-15 HISTORY — DX: Disease of blood and blood-forming organs, unspecified: D75.9

## 2018-04-15 HISTORY — DX: Major depressive disorder, single episode, unspecified: F32.9

## 2018-04-15 LAB — CBC WITH DIFFERENTIAL/PLATELET
Abs Immature Granulocytes: 0 10*3/uL (ref 0.0–0.1)
Basophils Absolute: 0.1 10*3/uL (ref 0.0–0.1)
Basophils Relative: 1 %
Eosinophils Absolute: 0.4 10*3/uL (ref 0.0–0.7)
Eosinophils Relative: 5 %
HCT: 39.2 % (ref 36.0–46.0)
Hemoglobin: 13 g/dL (ref 12.0–15.0)
Immature Granulocytes: 0 %
Lymphocytes Relative: 37 %
Lymphs Abs: 2.7 10*3/uL (ref 0.7–4.0)
MCH: 30.4 pg (ref 26.0–34.0)
MCHC: 33.2 g/dL (ref 30.0–36.0)
MCV: 91.6 fL (ref 78.0–100.0)
Monocytes Absolute: 0.5 10*3/uL (ref 0.1–1.0)
Monocytes Relative: 6 %
Neutro Abs: 3.7 10*3/uL (ref 1.7–7.7)
Neutrophils Relative %: 51 %
Platelets: 340 10*3/uL (ref 150–400)
RBC: 4.28 MIL/uL (ref 3.87–5.11)
RDW: 11.8 % (ref 11.5–15.5)
WBC: 7.3 10*3/uL (ref 4.0–10.5)

## 2018-04-15 LAB — COMPREHENSIVE METABOLIC PANEL
ALT: 11 U/L (ref 0–44)
AST: 12 U/L — ABNORMAL LOW (ref 15–41)
Albumin: 4 g/dL (ref 3.5–5.0)
Alkaline Phosphatase: 55 U/L (ref 38–126)
Anion gap: 9 (ref 5–15)
BUN: 19 mg/dL (ref 6–20)
CO2: 22 mmol/L (ref 22–32)
Calcium: 8.8 mg/dL — ABNORMAL LOW (ref 8.9–10.3)
Chloride: 108 mmol/L (ref 98–111)
Creatinine, Ser: 0.74 mg/dL (ref 0.44–1.00)
GFR calc Af Amer: >60 mL/min (ref >60)
GFR calc non Af Amer: >60 mL/min (ref >60)
Glucose, Bld: 84 mg/dL (ref 70–99)
Potassium: 3.5 mmol/L (ref 3.5–5.1)
Sodium: 139 mmol/L (ref 135–145)
Total Bilirubin: 0.6 mg/dL (ref 0.3–1.2)
Total Protein: 6.3 g/dL — ABNORMAL LOW (ref 6.5–8.1)

## 2018-04-15 LAB — SURGICAL PCR SCREEN
MRSA, PCR: NEGATIVE
Staphylococcus aureus: NEGATIVE

## 2018-04-15 NOTE — Progress Notes (Addendum)
PCP - [place] Cardiologist - denies cardiac workup  EKG- 11/03/17  Pt instructed not to take Adderall the day of surgery  Patient denies shortness of breath, fever, cough and chest pain at PAT appointment  Patient verbalized understanding of instructions that were given to them at the PAT appointment. Patient was also instructed that they will need to review over the PAT instructions again at home before surgery.

## 2018-04-18 ENCOUNTER — Inpatient Hospital Stay (HOSPITAL_COMMUNITY): Payer: Medicaid Other | Admitting: Certified Registered Nurse Anesthetist

## 2018-04-18 ENCOUNTER — Ambulatory Visit (HOSPITAL_COMMUNITY): Payer: Self-pay | Admitting: Psychiatry

## 2018-04-18 DIAGNOSIS — F17210 Nicotine dependence, cigarettes, uncomplicated: Secondary | ICD-10-CM | POA: Diagnosis not present

## 2018-04-18 DIAGNOSIS — Z5309 Procedure and treatment not carried out because of other contraindication: Secondary | ICD-10-CM | POA: Diagnosis not present

## 2018-04-18 DIAGNOSIS — F429 Obsessive-compulsive disorder, unspecified: Secondary | ICD-10-CM | POA: Diagnosis not present

## 2018-04-18 DIAGNOSIS — M4712 Other spondylosis with myelopathy, cervical region: Secondary | ICD-10-CM | POA: Diagnosis present

## 2018-04-18 DIAGNOSIS — F909 Attention-deficit hyperactivity disorder, unspecified type: Secondary | ICD-10-CM | POA: Diagnosis not present

## 2018-04-18 DIAGNOSIS — F1590 Other stimulant use, unspecified, uncomplicated: Secondary | ICD-10-CM | POA: Diagnosis not present

## 2018-04-18 DIAGNOSIS — F419 Anxiety disorder, unspecified: Secondary | ICD-10-CM | POA: Diagnosis not present

## 2018-04-18 DIAGNOSIS — F339 Major depressive disorder, recurrent, unspecified: Secondary | ICD-10-CM | POA: Diagnosis not present

## 2018-04-18 DIAGNOSIS — F1190 Opioid use, unspecified, uncomplicated: Secondary | ICD-10-CM | POA: Diagnosis not present

## 2018-04-18 DIAGNOSIS — F1290 Cannabis use, unspecified, uncomplicated: Secondary | ICD-10-CM | POA: Diagnosis not present

## 2018-04-18 DIAGNOSIS — Z79899 Other long term (current) drug therapy: Secondary | ICD-10-CM | POA: Diagnosis not present

## 2018-04-19 ENCOUNTER — Ambulatory Visit (HOSPITAL_COMMUNITY)
Admission: RE | Admit: 2018-04-19 | Discharge: 2018-04-19 | Disposition: A | Discharge status: Home / Self Care | Payer: Medicaid Other | Source: Ambulatory Visit | Attending: Neurosurgery | Admitting: Neurosurgery

## 2018-04-19 ENCOUNTER — Encounter (HOSPITAL_COMMUNITY)
Admission: RE | Disposition: A | Discharge status: Home / Self Care | Payer: Self-pay | Source: Ambulatory Visit | Attending: Neurosurgery

## 2018-04-19 ENCOUNTER — Encounter (HOSPITAL_COMMUNITY): Payer: Self-pay

## 2018-04-19 DIAGNOSIS — F419 Anxiety disorder, unspecified: Secondary | ICD-10-CM | POA: Insufficient documentation

## 2018-04-19 DIAGNOSIS — Z79899 Other long term (current) drug therapy: Secondary | ICD-10-CM | POA: Insufficient documentation

## 2018-04-19 DIAGNOSIS — F1290 Cannabis use, unspecified, uncomplicated: Secondary | ICD-10-CM | POA: Insufficient documentation

## 2018-04-19 DIAGNOSIS — M4712 Other spondylosis with myelopathy, cervical region: Secondary | ICD-10-CM | POA: Diagnosis not present

## 2018-04-19 DIAGNOSIS — F17210 Nicotine dependence, cigarettes, uncomplicated: Secondary | ICD-10-CM | POA: Insufficient documentation

## 2018-04-19 DIAGNOSIS — F909 Attention-deficit hyperactivity disorder, unspecified type: Secondary | ICD-10-CM | POA: Insufficient documentation

## 2018-04-19 DIAGNOSIS — F1190 Opioid use, unspecified, uncomplicated: Secondary | ICD-10-CM | POA: Insufficient documentation

## 2018-04-19 DIAGNOSIS — F1590 Other stimulant use, unspecified, uncomplicated: Secondary | ICD-10-CM | POA: Insufficient documentation

## 2018-04-19 DIAGNOSIS — F339 Major depressive disorder, recurrent, unspecified: Secondary | ICD-10-CM | POA: Insufficient documentation

## 2018-04-19 DIAGNOSIS — F429 Obsessive-compulsive disorder, unspecified: Secondary | ICD-10-CM | POA: Insufficient documentation

## 2018-04-19 DIAGNOSIS — Z5309 Procedure and treatment not carried out because of other contraindication: Secondary | ICD-10-CM | POA: Insufficient documentation

## 2018-04-19 LAB — POCT PREGNANCY, URINE: Preg Test, Ur: NEGATIVE

## 2018-04-19 LAB — RAPID URINE DRUG SCREEN, HOSP PERFORMED
Amphetamines: POSITIVE — AB
Barbiturates: NOT DETECTED
Benzodiazepines: POSITIVE — AB
Cocaine: NOT DETECTED
Opiates: POSITIVE — AB
Tetrahydrocannabinol: POSITIVE — AB

## 2018-04-19 SURGERY — ANTERIOR CERVICAL DECOMPRESSION/DISCECTOMY FUSION 3 LEVEL/HARDWARE REMOVAL
Anesthesia: General

## 2018-04-19 MED ORDER — FENTANYL CITRATE (PF) 250 MCG/5ML IJ SOLN
INTRAMUSCULAR | Status: AC
  Filled 2018-04-19: qty 5

## 2018-04-19 MED ORDER — PROPOFOL 10 MG/ML IV BOLUS
INTRAVENOUS | Status: AC
  Filled 2018-04-19: qty 20

## 2018-04-19 MED ORDER — THROMBIN (RECOMBINANT) 20000 UNITS EX SOLR
CUTANEOUS | Status: AC
  Filled 2018-04-19: qty 20000

## 2018-04-19 MED ORDER — MIDAZOLAM HCL 2 MG/2ML IJ SOLN
INTRAMUSCULAR | Status: AC
  Filled 2018-04-19: qty 2

## 2018-04-19 MED ORDER — CEFAZOLIN SODIUM-DEXTROSE 2-4 GM/100ML-% IV SOLN
2.0000 g | INTRAVENOUS | Status: DC
  Filled 2018-04-19: qty 100

## 2018-04-19 MED ORDER — DEXAMETHASONE SODIUM PHOSPHATE 10 MG/ML IJ SOLN
INTRAMUSCULAR | Status: AC
  Filled 2018-04-19: qty 1

## 2018-04-19 MED ORDER — CHLORHEXIDINE GLUCONATE CLOTH 2 % EX PADS: 6.0000 | MEDICATED_PAD | Freq: Once | CUTANEOUS | Status: DC

## 2018-04-19 MED ORDER — LACTATED RINGERS IV SOLN
INTRAVENOUS | Status: DC
  Administered 2018-04-19: 11:00:00 via INTRAVENOUS

## 2018-04-19 MED ORDER — DEXAMETHASONE SODIUM PHOSPHATE 10 MG/ML IJ SOLN
10.0000 mg | INTRAMUSCULAR | Status: DC
  Filled 2018-04-19: qty 1

## 2018-04-19 MED ORDER — THROMBIN 5000 UNITS EX SOLR
CUTANEOUS | Status: AC
  Filled 2018-04-19: qty 5000
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
## 2018-04-19 NOTE — Anesthesia Preprocedure Evaluation (Deleted)
Anesthesia Evaluation  Patient identified by MRN, date of birth, ID band Patient awake    Reviewed: Allergy & Precautions, NPO status , Patient's Chart, lab work & pertinent test results  Airway Mallampati: II     Mouth opening: Limited Mouth Opening  Dental  (+) Poor Dentition   Pulmonary Current Smoker,    Pulmonary exam normal breath sounds clear to auscultation       Cardiovascular Normal cardiovascular exam Rhythm:Regular Rate:Normal     Neuro/Psych PSYCHIATRIC DISORDERS Anxiety Depression Bipolar Disorder    GI/Hepatic negative GI ROS, Neg liver ROS,   Endo/Other  negative endocrine ROS  Renal/GU negative Renal ROS     Musculoskeletal negative musculoskeletal ROS (+)   Abdominal Normal abdominal exam  (+)   Peds  Hematology   Anesthesia Other Findings   Reproductive/Obstetrics                            Anesthesia Physical Anesthesia Plan  ASA: II  Anesthesia Plan: General   Post-op Pain Management:    Induction: Intravenous  PONV Risk Score and Plan: 3 and Ondansetron and Dexamethasone  Airway Management Planned: Oral ETT  Additional Equipment:   Intra-op Plan:   Post-operative Plan: Extubation in OR  Informed Consent: I have reviewed the patients History and Physical, chart, labs and discussed the procedure including the risks, benefits and alternatives for the proposed anesthesia with the patient or authorized representative who has indicated his/her understanding and acceptance.   Dental advisory given  Plan Discussed with: CRNA and Surgeon  Anesthesia Plan Comments:         Anesthesia Quick Evaluation

## 2018-04-19 NOTE — Progress Notes (Signed)
Patient evaluated on arrival to short stay and was alert and oriented.  Able to follow commands and responded to questions appropriately.  When CRNA came to room, patient was sleepy, not able to stay awake while CRNA was asking questions, speech was slurred.  Urine was collected for a urine drug screen and results were abnormal.  Dr. Jordan LikesPool and Dr. Arby BarretteHatchett made aware.     IV removed, site clean, dry, and intact.  Patient escorted to main entrance via wheelchair by nurse tech.  Spouse and son with patient.

## 2018-05-01 ENCOUNTER — Other Ambulatory Visit: Payer: Self-pay | Admitting: Neurosurgery

## 2018-05-14 ENCOUNTER — Encounter (HOSPITAL_COMMUNITY)
Admission: RE | Admit: 2018-05-14 | Discharge: 2018-05-14 | Disposition: A | Discharge status: Home / Self Care | Payer: Medicaid Other | Source: Ambulatory Visit | Attending: Neurosurgery | Admitting: Neurosurgery

## 2018-05-14 ENCOUNTER — Encounter (HOSPITAL_COMMUNITY): Payer: Self-pay

## 2018-05-14 ENCOUNTER — Other Ambulatory Visit: Payer: Self-pay

## 2018-05-14 DIAGNOSIS — Z01812 Encounter for preprocedural laboratory examination: Secondary | ICD-10-CM | POA: Insufficient documentation

## 2018-05-14 HISTORY — DX: Spinal stenosis, cervical region: M48.02

## 2018-05-14 HISTORY — DX: Unspecified osteoarthritis, unspecified site: M19.90

## 2018-05-14 LAB — CBC
HCT: 40.5 % (ref 36.0–46.0)
Hemoglobin: 12.8 g/dL (ref 12.0–15.0)
MCH: 30.2 pg (ref 26.0–34.0)
MCHC: 31.6 g/dL (ref 30.0–36.0)
MCV: 95.5 fL (ref 78.0–100.0)
Platelets: 345 10*3/uL (ref 150–400)
RBC: 4.24 MIL/uL (ref 3.87–5.11)
RDW: 11.9 % (ref 11.5–15.5)
WBC: 8.6 10*3/uL (ref 4.0–10.5)

## 2018-05-14 LAB — COMPREHENSIVE METABOLIC PANEL
ALT: 13 U/L (ref 0–44)
AST: 16 U/L (ref 15–41)
Albumin: 3.7 g/dL (ref 3.5–5.0)
Alkaline Phosphatase: 56 U/L (ref 38–126)
Anion gap: 9 (ref 5–15)
BUN: 11 mg/dL (ref 6–20)
CO2: 24 mmol/L (ref 22–32)
Calcium: 9 mg/dL (ref 8.9–10.3)
Chloride: 106 mmol/L (ref 98–111)
Creatinine, Ser: 0.72 mg/dL (ref 0.44–1.00)
GFR calc Af Amer: >60 mL/min (ref >60)
GFR calc non Af Amer: >60 mL/min (ref >60)
Glucose, Bld: 63 mg/dL — ABNORMAL LOW (ref 70–99)
Potassium: 4.7 mmol/L (ref 3.5–5.1)
Sodium: 139 mmol/L (ref 135–145)
Total Bilirubin: 0.4 mg/dL (ref 0.3–1.2)
Total Protein: 6.2 g/dL — ABNORMAL LOW (ref 6.5–8.1)

## 2018-05-14 LAB — SURGICAL PCR SCREEN
MRSA, PCR: NEGATIVE
Staphylococcus aureus: NEGATIVE

## 2018-05-14 NOTE — Pre-Procedure Instructions (Signed)
   Annice PihCarey A Lagman  05/14/2018     [facility] - [place] - [address] [place] [address] Phone: [phone] Fax: [phone]   Your procedure is scheduled on Tuesday, May 21, 2018  [facility] Admitting at 9:15 A.M.  Call this number if you have problems the morning of surgery:  [phone]   Remember:   Do not eat or drink after midnight Monday, May 20, 2018  Take these medicines the morning of surgery with A SIP OF WATER: cetirizine (ZYRTEC), gabapentin (NEURONTIN), lamoTRIgine (LAMICTAL),  If needed: oxyCODONE (PERCOCET) for pain,  clonazePAM (KLONOPINfor anxiety, albuterol (PROVENTIL HFA;VENTOLIN HFA)  Inhaler for wheezing or shortness of breath ( Bring inhaler in with you on day of surgery), fluticasone (FLONASE)  nasal spray for allergies Stop taking Aspirin (unless advised otherwise by surgeon), vitamins, fish oil and herbal medications. Do not take any NSAIDs ie: Ibuprofen, Advil, Naproxen (Aleve), Motrin, BC and Goody Powder; stop now.   Do not wear jewelry, make-up or nail polish.  Do not wear lotions, powders, or perfumes, or deodorant.  Do not shave 48 hours prior to surgery.    Do not bring valuables to the hospital.  [facility] is not responsible for any belongings or valuables.  Contacts, dentures or bridgework may not be worn into surgery.  Leave your suitcase in the car.  After surgery it may be brought to your room. Patients discharged the day of surgery will not be allowed to drive home.  Special instructions: See " [place]-Preparing For Surgery " sheet Please read over the following fact sheets that you were given. Pain Booklet, Coughing and Deep Breathing, MRSA Information and Surgical Site Infection Prevention

## 2018-05-14 NOTE — Progress Notes (Signed)
Pt denies SOB, chest pain, and being under the care of a cardiologist. Pt denies having a stress test, echo and cardiac cath. Pt denies having a chest x ray within the last year. Pt denies recent labs. Pt chart forwarded to anesthesia to review glucose lab result of 63. Surgery was canceled on 04/19/18; see progress note.
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
## 2018-05-16 ENCOUNTER — Other Ambulatory Visit (HOSPITAL_COMMUNITY): Payer: Self-pay | Admitting: Psychiatry

## 2018-05-16 DIAGNOSIS — F319 Bipolar disorder, unspecified: Secondary | ICD-10-CM

## 2018-05-16 DIAGNOSIS — F411 Generalized anxiety disorder: Secondary | ICD-10-CM

## 2018-05-21 ENCOUNTER — Inpatient Hospital Stay (HOSPITAL_COMMUNITY)
Admission: RE | Admit: 2018-05-21 | Discharge: 2018-05-22 | DRG: 472 | Disposition: A | Discharge status: Home / Self Care | Payer: Medicaid Other | Attending: Neurosurgery | Admitting: Neurosurgery

## 2018-05-21 ENCOUNTER — Inpatient Hospital Stay (HOSPITAL_COMMUNITY): Payer: Medicaid Other | Admitting: Physician Assistant

## 2018-05-21 ENCOUNTER — Inpatient Hospital Stay (HOSPITAL_COMMUNITY): Payer: Medicaid Other | Admitting: Certified Registered Nurse Anesthetist

## 2018-05-21 ENCOUNTER — Inpatient Hospital Stay (HOSPITAL_COMMUNITY)
Admission: RE | Disposition: A | Discharge status: Home / Self Care | Payer: Self-pay | Source: Home / Self Care | Attending: Neurosurgery

## 2018-05-21 ENCOUNTER — Encounter (HOSPITAL_COMMUNITY): Payer: Self-pay | Admitting: Certified Registered Nurse Anesthetist

## 2018-05-21 ENCOUNTER — Inpatient Hospital Stay (HOSPITAL_COMMUNITY): Payer: Medicaid Other

## 2018-05-21 ENCOUNTER — Other Ambulatory Visit: Payer: Self-pay

## 2018-05-21 DIAGNOSIS — K760 Fatty (change of) liver, not elsewhere classified: Secondary | ICD-10-CM | POA: Diagnosis present

## 2018-05-21 DIAGNOSIS — G959 Disease of spinal cord, unspecified: Secondary | ICD-10-CM | POA: Diagnosis present

## 2018-05-21 DIAGNOSIS — Y848 Other medical procedures as the cause of abnormal reaction of the patient, or of later complication, without mention of misadventure at the time of the procedure: Secondary | ICD-10-CM | POA: Diagnosis present

## 2018-05-21 DIAGNOSIS — F17210 Nicotine dependence, cigarettes, uncomplicated: Secondary | ICD-10-CM | POA: Diagnosis present

## 2018-05-21 DIAGNOSIS — F429 Obsessive-compulsive disorder, unspecified: Secondary | ICD-10-CM | POA: Diagnosis present

## 2018-05-21 DIAGNOSIS — M479 Spondylosis, unspecified: Secondary | ICD-10-CM | POA: Diagnosis present

## 2018-05-21 DIAGNOSIS — M4802 Spinal stenosis, cervical region: Secondary | ICD-10-CM | POA: Diagnosis present

## 2018-05-21 DIAGNOSIS — G992 Myelopathy in diseases classified elsewhere: Secondary | ICD-10-CM | POA: Diagnosis present

## 2018-05-21 DIAGNOSIS — F909 Attention-deficit hyperactivity disorder, unspecified type: Secondary | ICD-10-CM | POA: Diagnosis present

## 2018-05-21 DIAGNOSIS — T84296A Other mechanical complication of internal fixation device of vertebrae, initial encounter: Secondary | ICD-10-CM | POA: Diagnosis present

## 2018-05-21 DIAGNOSIS — M542 Cervicalgia: Secondary | ICD-10-CM | POA: Diagnosis present

## 2018-05-21 DIAGNOSIS — J449 Chronic obstructive pulmonary disease, unspecified: Secondary | ICD-10-CM | POA: Diagnosis present

## 2018-05-21 DIAGNOSIS — Z419 Encounter for procedure for purposes other than remedying health state, unspecified: Secondary | ICD-10-CM

## 2018-05-21 HISTORY — PX: ANTERIOR CERVICAL DECOMP/DISCECTOMY FUSION: SHX1161

## 2018-05-21 LAB — POCT PREGNANCY, URINE: Preg Test, Ur: NEGATIVE

## 2018-05-21 SURGERY — ANTERIOR CERVICAL DECOMPRESSION/DISCECTOMY FUSION 3 LEVEL/HARDWARE REMOVAL
Anesthesia: General | Site: Spine Cervical

## 2018-05-21 MED ORDER — NICOTINE 21 MG/24HR TD PT24
21.0000 mg | MEDICATED_PATCH | Freq: Every day | TRANSDERMAL | Status: DC
  Administered 2018-05-21 – 2018-05-22 (×2): 21 mg via TRANSDERMAL
  Filled 2018-05-21 (×2): qty 1

## 2018-05-21 MED ORDER — THROMBIN 5000 UNITS EX SOLR
CUTANEOUS | Status: AC
  Filled 2018-05-21: qty 5000

## 2018-05-21 MED ORDER — 0.9 % SODIUM CHLORIDE (POUR BTL) OPTIME
TOPICAL | Status: DC | PRN
  Administered 2018-05-21: 1000 mL

## 2018-05-21 MED ORDER — PROPOFOL 10 MG/ML IV BOLUS
INTRAVENOUS | Status: AC
  Filled 2018-05-21: qty 40

## 2018-05-21 MED ORDER — SODIUM CHLORIDE 0.9 % IV SOLN
INTRAVENOUS | Status: DC | PRN
  Administered 2018-05-21: 500 mL

## 2018-05-21 MED ORDER — LABETALOL HCL 5 MG/ML IV SOLN
10.0000 mg | INTRAVENOUS | Status: DC | PRN
  Administered 2018-05-21: 10 mg via INTRAVENOUS

## 2018-05-21 MED ORDER — HYDROMORPHONE HCL 1 MG/ML IJ SOLN
INTRAMUSCULAR | Status: AC
  Filled 2018-05-21: qty 1

## 2018-05-21 MED ORDER — LIDOCAINE 2% (20 MG/ML) 5 ML SYRINGE
INTRAMUSCULAR | Status: AC
  Filled 2018-05-21: qty 5

## 2018-05-21 MED ORDER — MIDAZOLAM HCL 2 MG/2ML IJ SOLN
INTRAMUSCULAR | Status: DC | PRN
  Administered 2018-05-21: 2 mg via INTRAVENOUS

## 2018-05-21 MED ORDER — OXYCODONE-ACETAMINOPHEN 5-325 MG PO TABS
1.0000 | ORAL_TABLET | ORAL | Status: DC | PRN
  Administered 2018-05-21 – 2018-05-22 (×4): 2 via ORAL
  Filled 2018-05-21 (×4): qty 2

## 2018-05-21 MED ORDER — HYDROCODONE-ACETAMINOPHEN 5-325 MG PO TABS: 1.0000 | ORAL_TABLET | ORAL | Status: DC | PRN

## 2018-05-21 MED ORDER — ACETAMINOPHEN 650 MG RE SUPP: 650.0000 mg | RECTAL | Status: DC | PRN

## 2018-05-21 MED ORDER — CLONAZEPAM 0.5 MG PO TABS
0.5000 mg | ORAL_TABLET | Freq: Four times a day (QID) | ORAL | Status: DC | PRN
  Administered 2018-05-21 – 2018-05-22 (×3): 0.5 mg via ORAL
  Filled 2018-05-21 (×3): qty 1

## 2018-05-21 MED ORDER — HYDROMORPHONE HCL 1 MG/ML IJ SOLN
INTRAMUSCULAR | Status: DC | PRN
  Administered 2018-05-21 (×3): 0.5 mg via INTRAVENOUS

## 2018-05-21 MED ORDER — ONDANSETRON HCL 4 MG/2ML IJ SOLN
INTRAMUSCULAR | Status: DC | PRN
  Administered 2018-05-21: 4 mg via INTRAVENOUS

## 2018-05-21 MED ORDER — ONDANSETRON HCL 4 MG/2ML IJ SOLN
INTRAMUSCULAR | Status: AC
  Filled 2018-05-21: qty 2

## 2018-05-21 MED ORDER — HEMOSTATIC AGENTS (NO CHARGE) OPTIME
TOPICAL | Status: DC | PRN
  Administered 2018-05-21: 1 via TOPICAL

## 2018-05-21 MED ORDER — HYDROMORPHONE HCL 1 MG/ML IJ SOLN
1.0000 mg | INTRAMUSCULAR | Status: DC | PRN
  Administered 2018-05-22: 1 mg via INTRAVENOUS
  Filled 2018-05-21: qty 1

## 2018-05-21 MED ORDER — MENTHOL 3 MG MT LOZG: 1.0000 | LOZENGE | OROMUCOSAL | Status: DC | PRN

## 2018-05-21 MED ORDER — ROCURONIUM BROMIDE 50 MG/5ML IV SOSY
PREFILLED_SYRINGE | INTRAVENOUS | Status: AC
  Filled 2018-05-21: qty 10

## 2018-05-21 MED ORDER — HYDROMORPHONE HCL 1 MG/ML IJ SOLN
0.2500 mg | INTRAMUSCULAR | Status: DC | PRN
  Administered 2018-05-21: 1 mg via INTRAVENOUS
  Administered 2018-05-21: 0.5 mg via INTRAVENOUS

## 2018-05-21 MED ORDER — DEXAMETHASONE SODIUM PHOSPHATE 10 MG/ML IJ SOLN
INTRAMUSCULAR | Status: AC
  Filled 2018-05-21: qty 1

## 2018-05-21 MED ORDER — ONDANSETRON HCL 4 MG/2ML IJ SOLN: 4.0000 mg | Freq: Four times a day (QID) | INTRAMUSCULAR | Status: DC | PRN

## 2018-05-21 MED ORDER — HYDROMORPHONE HCL 1 MG/ML IJ SOLN
INTRAMUSCULAR | Status: AC
  Filled 2018-05-21: qty 0.5

## 2018-05-21 MED ORDER — PROPOFOL 10 MG/ML IV BOLUS
INTRAVENOUS | Status: DC | PRN
  Administered 2018-05-21: 160 mg via INTRAVENOUS

## 2018-05-21 MED ORDER — CEFAZOLIN SODIUM-DEXTROSE 1-4 GM/50ML-% IV SOLN
1.0000 g | Freq: Three times a day (TID) | INTRAVENOUS | Status: AC
  Administered 2018-05-21 – 2018-05-22 (×2): 1 g via INTRAVENOUS
  Filled 2018-05-21 (×2): qty 50

## 2018-05-21 MED ORDER — THROMBIN (RECOMBINANT) 20000 UNITS EX SOLR
CUTANEOUS | Status: AC
  Filled 2018-05-21: qty 20000

## 2018-05-21 MED ORDER — HYDROCODONE-ACETAMINOPHEN 10-325 MG PO TABS
2.0000 | ORAL_TABLET | ORAL | Status: DC | PRN
  Administered 2018-05-21: 2 via ORAL
  Filled 2018-05-21: qty 2

## 2018-05-21 MED ORDER — LORATADINE 10 MG PO TABS
10.0000 mg | ORAL_TABLET | Freq: Every day | ORAL | Status: DC
  Administered 2018-05-22: 10 mg via ORAL
  Filled 2018-05-21: qty 1

## 2018-05-21 MED ORDER — LAMOTRIGINE 150 MG PO TABS
250.0000 mg | ORAL_TABLET | Freq: Every day | ORAL | Status: DC
  Administered 2018-05-22: 250 mg via ORAL
  Filled 2018-05-21: qty 1

## 2018-05-21 MED ORDER — LIDOCAINE HCL (CARDIAC) PF 100 MG/5ML IV SOSY
PREFILLED_SYRINGE | INTRAVENOUS | Status: DC | PRN
  Administered 2018-05-21: 60 mg via INTRAVENOUS

## 2018-05-21 MED ORDER — SODIUM CHLORIDE 0.9% FLUSH: 3.0000 mL | INTRAVENOUS | Status: DC | PRN

## 2018-05-21 MED ORDER — FENTANYL CITRATE (PF) 250 MCG/5ML IJ SOLN
INTRAMUSCULAR | Status: AC
  Filled 2018-05-21: qty 5

## 2018-05-21 MED ORDER — DEXAMETHASONE SODIUM PHOSPHATE 10 MG/ML IJ SOLN
10.0000 mg | INTRAMUSCULAR | Status: AC
  Administered 2018-05-21: 10 mg via INTRAVENOUS
  Filled 2018-05-21: qty 1

## 2018-05-21 MED ORDER — ALBUTEROL SULFATE (2.5 MG/3ML) 0.083% IN NEBU: 2.5000 mg | INHALATION_SOLUTION | RESPIRATORY_TRACT | Status: DC | PRN

## 2018-05-21 MED ORDER — LURASIDONE HCL 40 MG PO TABS
80.0000 mg | ORAL_TABLET | Freq: Every evening | ORAL | Status: DC
  Administered 2018-05-21: 80 mg via ORAL
  Filled 2018-05-21 (×3): qty 2

## 2018-05-21 MED ORDER — MIDAZOLAM HCL 2 MG/2ML IJ SOLN
INTRAMUSCULAR | Status: AC
  Filled 2018-05-21: qty 2

## 2018-05-21 MED ORDER — FERROUS SULFATE 325 (65 FE) MG PO TABS
325.0000 mg | ORAL_TABLET | Freq: Every day | ORAL | Status: DC
  Administered 2018-05-22: 325 mg via ORAL
  Filled 2018-05-21: qty 1

## 2018-05-21 MED ORDER — ROCURONIUM BROMIDE 100 MG/10ML IV SOLN
INTRAVENOUS | Status: DC | PRN
  Administered 2018-05-21: 20 mg via INTRAVENOUS
  Administered 2018-05-21: 50 mg via INTRAVENOUS

## 2018-05-21 MED ORDER — CHLORHEXIDINE GLUCONATE CLOTH 2 % EX PADS: 6.0000 | MEDICATED_PAD | Freq: Once | CUTANEOUS | Status: DC

## 2018-05-21 MED ORDER — OXYCODONE HCL 5 MG PO TABS: 5.0000 mg | ORAL_TABLET | Freq: Once | ORAL | Status: DC | PRN

## 2018-05-21 MED ORDER — CYCLOBENZAPRINE HCL 10 MG PO TABS: 10.0000 mg | ORAL_TABLET | Freq: Three times a day (TID) | ORAL | Status: DC | PRN

## 2018-05-21 MED ORDER — CEFAZOLIN SODIUM-DEXTROSE 2-4 GM/100ML-% IV SOLN
2.0000 g | INTRAVENOUS | Status: AC
  Administered 2018-05-21: 2 g via INTRAVENOUS

## 2018-05-21 MED ORDER — IRON 28 MG PO TABS: 28.0000 mg | ORAL_TABLET | Freq: Every day | ORAL | Status: DC

## 2018-05-21 MED ORDER — SODIUM CHLORIDE 0.9% FLUSH
3.0000 mL | Freq: Two times a day (BID) | INTRAVENOUS | Status: DC
  Administered 2018-05-21: 3 mL via INTRAVENOUS

## 2018-05-21 MED ORDER — HYDROCODONE-ACETAMINOPHEN 10-325 MG PO TABS
ORAL_TABLET | ORAL | Status: AC
  Filled 2018-05-21: qty 2

## 2018-05-21 MED ORDER — ALBUTEROL SULFATE HFA 108 (90 BASE) MCG/ACT IN AERS: 1.0000 | INHALATION_SPRAY | RESPIRATORY_TRACT | Status: DC | PRN

## 2018-05-21 MED ORDER — PHENYLEPHRINE 40 MCG/ML (10ML) SYRINGE FOR IV PUSH (FOR BLOOD PRESSURE SUPPORT)
PREFILLED_SYRINGE | INTRAVENOUS | Status: AC
  Filled 2018-05-21: qty 10

## 2018-05-21 MED ORDER — ONDANSETRON HCL 4 MG PO TABS: 4.0000 mg | ORAL_TABLET | Freq: Four times a day (QID) | ORAL | Status: DC | PRN

## 2018-05-21 MED ORDER — PHENOL 1.4 % MT LIQD: 1.0000 | OROMUCOSAL | Status: DC | PRN

## 2018-05-21 MED ORDER — CEFAZOLIN SODIUM-DEXTROSE 2-4 GM/100ML-% IV SOLN
INTRAVENOUS | Status: AC
  Filled 2018-05-21: qty 100

## 2018-05-21 MED ORDER — LACTATED RINGERS IV SOLN
INTRAVENOUS | Status: DC
  Administered 2018-05-21 (×3): via INTRAVENOUS

## 2018-05-21 MED ORDER — OXYCODONE HCL 5 MG/5ML PO SOLN: 5.0000 mg | Freq: Once | ORAL | Status: DC | PRN

## 2018-05-21 MED ORDER — CARISOPRODOL 350 MG PO TABS
350.0000 mg | ORAL_TABLET | Freq: Two times a day (BID) | ORAL | Status: DC
  Administered 2018-05-21 – 2018-05-22 (×2): 350 mg via ORAL
  Filled 2018-05-21 (×2): qty 1

## 2018-05-21 MED ORDER — SUGAMMADEX SODIUM 200 MG/2ML IV SOLN
INTRAVENOUS | Status: DC | PRN
  Administered 2018-05-21: 140 mg via INTRAVENOUS

## 2018-05-21 MED ORDER — FLUTICASONE PROPIONATE 50 MCG/ACT NA SUSP
1.0000 | Freq: Every day | NASAL | Status: DC | PRN
  Filled 2018-05-21: qty 16

## 2018-05-21 MED ORDER — AMPHETAMINE-DEXTROAMPHETAMINE 10 MG PO TABS
30.0000 mg | ORAL_TABLET | Freq: Two times a day (BID) | ORAL | Status: DC
  Administered 2018-05-21 – 2018-05-22 (×2): 30 mg via ORAL
  Filled 2018-05-21 (×2): qty 3

## 2018-05-21 MED ORDER — GABAPENTIN 400 MG PO CAPS
400.0000 mg | ORAL_CAPSULE | Freq: Three times a day (TID) | ORAL | Status: DC
  Administered 2018-05-21 – 2018-05-22 (×3): 400 mg via ORAL
  Filled 2018-05-21 (×3): qty 1

## 2018-05-21 MED ORDER — PHENYLEPHRINE 40 MCG/ML (10ML) SYRINGE FOR IV PUSH (FOR BLOOD PRESSURE SUPPORT)
PREFILLED_SYRINGE | INTRAVENOUS | Status: DC | PRN
  Administered 2018-05-21: 40 ug via INTRAVENOUS
  Administered 2018-05-21: 80 ug via INTRAVENOUS

## 2018-05-21 MED ORDER — LABETALOL HCL 5 MG/ML IV SOLN
INTRAVENOUS | Status: AC
  Filled 2018-05-21: qty 4

## 2018-05-21 MED ORDER — ACETAMINOPHEN 325 MG PO TABS: 650.0000 mg | ORAL_TABLET | ORAL | Status: DC | PRN

## 2018-05-21 SURGICAL SUPPLY — 74 items
ADH SKN CLS APL DERMABOND .7 (GAUZE/BANDAGES/DRESSINGS) ×1
APL SKNCLS STERI-STRIP NONHPOA (GAUZE/BANDAGES/DRESSINGS) ×1
BAG DECANTER FOR FLEXI CONT (MISCELLANEOUS) ×3 IMPLANT
BENZOIN TINCTURE PRP APPL 2/3 (GAUZE/BANDAGES/DRESSINGS) ×3 IMPLANT
BIT DRILL 13 (BIT) ×1 IMPLANT
BIT DRILL 13MM (BIT) ×1
BUR MATCHSTICK NEURO 3.0 LAGG (BURR) ×3 IMPLANT
CAGE PEEK 6X14X11 (Cage) ×6 IMPLANT
CAGE PEEK 7X14X11 (Cage) ×3 IMPLANT
CANISTER SUCT 3000ML PPV (MISCELLANEOUS) ×3 IMPLANT
CARTRIDGE OIL MAESTRO DRILL (MISCELLANEOUS) ×1 IMPLANT
CLOSURE STERI-STRIP 1/2X4 (GAUZE/BANDAGES/DRESSINGS) ×1
CLOSURE WOUND 1/2 X4 (GAUZE/BANDAGES/DRESSINGS) ×1
CLSR STERI-STRIP ANTIMIC 1/2X4 (GAUZE/BANDAGES/DRESSINGS) ×1 IMPLANT
DERMABOND ADVANCED (GAUZE/BANDAGES/DRESSINGS) ×2
DERMABOND ADVANCED .7 DNX12 (GAUZE/BANDAGES/DRESSINGS) IMPLANT
DIFFUSER DRILL AIR PNEUMATIC (MISCELLANEOUS) ×3 IMPLANT
DRAPE C-ARM 42X72 X-RAY (DRAPES) ×6 IMPLANT
DRAPE LAPAROTOMY 100X72 PEDS (DRAPES) ×3 IMPLANT
DRAPE MICROSCOPE LEICA (MISCELLANEOUS) ×3 IMPLANT
DURAPREP 6ML APPLICATOR 50/CS (WOUND CARE) ×3 IMPLANT
ELECT COATED BLADE 2.86 ST (ELECTRODE) ×3 IMPLANT
ELECT REM PT RETURN 9FT ADLT (ELECTROSURGICAL) ×3
ELECTRODE REM PT RTRN 9FT ADLT (ELECTROSURGICAL) ×1 IMPLANT
GAUZE 4X4 16PLY RFD (DISPOSABLE) IMPLANT
GAUZE SPONGE 4X4 12PLY STRL (GAUZE/BANDAGES/DRESSINGS) ×3 IMPLANT
GAUZE SPONGE 4X4 12PLY STRL LF (GAUZE/BANDAGES/DRESSINGS) ×2 IMPLANT
GLOVE BIOGEL M 6.5 STRL (GLOVE) ×2 IMPLANT
GLOVE BIOGEL M STRL SZ7.5 (GLOVE) ×2 IMPLANT
GLOVE BIOGEL PI IND STRL 6.5 (GLOVE) IMPLANT
GLOVE BIOGEL PI IND STRL 7.0 (GLOVE) IMPLANT
GLOVE BIOGEL PI IND STRL 7.5 (GLOVE) IMPLANT
GLOVE BIOGEL PI INDICATOR 6.5 (GLOVE) ×2
GLOVE BIOGEL PI INDICATOR 7.0 (GLOVE) ×2
GLOVE BIOGEL PI INDICATOR 7.5 (GLOVE) ×6
GLOVE ECLIPSE 9.0 STRL (GLOVE) ×3 IMPLANT
GLOVE EXAM NITRILE LRG STRL (GLOVE) IMPLANT
GLOVE EXAM NITRILE XL STR (GLOVE) IMPLANT
GLOVE EXAM NITRILE XS STR PU (GLOVE) IMPLANT
GLOVE SURG SS PI 7.5 STRL IVOR (GLOVE) ×8 IMPLANT
GOWN STRL REUS W/ TWL LRG LVL3 (GOWN DISPOSABLE) IMPLANT
GOWN STRL REUS W/ TWL XL LVL3 (GOWN DISPOSABLE) IMPLANT
GOWN STRL REUS W/TWL 2XL LVL3 (GOWN DISPOSABLE) IMPLANT
GOWN STRL REUS W/TWL LRG LVL3 (GOWN DISPOSABLE) ×12
GOWN STRL REUS W/TWL XL LVL3 (GOWN DISPOSABLE) ×3
HALTER HD/CHIN CERV TRACTION D (MISCELLANEOUS) ×3 IMPLANT
HEMOSTAT POWDER KIT SURGIFOAM (HEMOSTASIS) IMPLANT
KIT BASIN OR (CUSTOM PROCEDURE TRAY) ×3 IMPLANT
KIT TURNOVER KIT B (KITS) ×3 IMPLANT
NDL SPNL 20GX3.5 QUINCKE YW (NEEDLE) ×1 IMPLANT
NEEDLE SPNL 20GX3.5 QUINCKE YW (NEEDLE) ×3 IMPLANT
NS IRRIG 1000ML POUR BTL (IV SOLUTION) ×3 IMPLANT
OIL CARTRIDGE MAESTRO DRILL (MISCELLANEOUS) ×3
PACK LAMINECTOMY NEURO (CUSTOM PROCEDURE TRAY) ×3 IMPLANT
PAD ARMBOARD 7.5X6 YLW CONV (MISCELLANEOUS) ×9 IMPLANT
PLATE 3 57.5XLCK NS SPNE CVD (Plate) IMPLANT
PLATE 3 ATLANTIS TRANS (Plate) ×3 IMPLANT
RUBBERBAND STERILE (MISCELLANEOUS) ×6 IMPLANT
SCREW ST FIX 4 ATL 3120213 (Screw) ×16 IMPLANT
SPACER SPNL 11X14X6XPEEK CVD (Cage) IMPLANT
SPACER SPNL 11X14X7XPEEK CVD (Cage) IMPLANT
SPCR SPNL 11X14X6XPEEK CVD (Cage) ×2 IMPLANT
SPCR SPNL 11X14X7XPEEK CVD (Cage) ×1 IMPLANT
SPONGE INTESTINAL PEANUT (DISPOSABLE) ×3 IMPLANT
SPONGE SURGIFOAM ABS GEL 100 (HEMOSTASIS) ×3 IMPLANT
STRIP CLOSURE SKIN 1/2X4 (GAUZE/BANDAGES/DRESSINGS) ×2 IMPLANT
SUT VIC AB 3-0 SH 8-18 (SUTURE) ×3 IMPLANT
SUT VIC AB 4-0 RB1 18 (SUTURE) ×3 IMPLANT
TAPE CLOTH 4X10 WHT NS (GAUZE/BANDAGES/DRESSINGS) ×3 IMPLANT
TAPE CLOTH SURG 4X10 WHT LF (GAUZE/BANDAGES/DRESSINGS) ×2 IMPLANT
TOWEL GREEN STERILE (TOWEL DISPOSABLE) ×3 IMPLANT
TOWEL GREEN STERILE FF (TOWEL DISPOSABLE) ×3 IMPLANT
TRAP SPECIMEN MUCOUS 40CC (MISCELLANEOUS) ×3 IMPLANT
WATER STERILE IRR 1000ML POUR (IV SOLUTION) ×3 IMPLANT

## 2018-05-21 NOTE — Op Note (Signed)
Date of procedure: 05/21/2018  Date of dictation: Same  Service: Neurosurgery  Preoperative diagnosis: Cervical stenosis with myelopathy, status post C4-5 arthroplasty with malfunction  Postoperative diagnosis: Same  Procedure Name: Reexploration of anterior cervical arthroplasty with removal of arthroplasty at C4-5.  C4-5, C5-6, C6-7 anterior cervical discectomies with interbody fusion utilizing interbody peek cages, locally harvested autograft, and anterior plate instrumentation.  Surgeon:Gabryella Murfin A.Amore Grater, M.D.  Asst. Surgeon: Johnsie Cancel  Anesthesia: General  Indication: 42 year old female status post prior C4-5 anterior cervical discectomy with arthroplasty by another physician for treatment of a large central disc herniation with myelopathy.  Patient with worsening neck and bilateral upper and lower extremity numbness, paresthesias and weakness.  Work-up demonstrates evidence of marked instability at the C4-5 level with significant anterolisthesis of her arthroplasty and marked cord compression.  Patient also with significant spondylosis and stenosis at C5-6 and C6-7.  Patient presents now for 3 level anterior cervical decompression and fusion in hopes of improving her symptoms.  Operative note: After induction of anesthesia, patient position supine with neck slightly extended and held in place of Holter traction.  Patient's anterior cervical region prepped and draped sterilely.  Incision made overlying C5-6.  Dissection performed on the right side.  Retractor placed.  Fluoroscopy used.  Levels confirmed.  Disc spaces at C5-6 and C6-7 were then incised with 15 blade.  Discectomy was then performed using various instruments down to level the posterior annulus.  The previously placed arthroplasty device at C4-5 was dissected free.  The superior aspect of the arthroplasty was impacted with an osteotome and and separated from the endplate of C4.  The superior aspect of the arthroplasty device was  then grasped and removed.  The spacer was then removed and then the inferior endplate of the arthroplasty device was separated from the superior endplate of C5 and removed.  Microscope brought in field using microdissection at all 3 levels.  At C4-5 the vertebral bodies were decorticated further.  Epidural scar was then gradually dissected free and revision discectomy was then performed.  The bodies of C4 and C5 were undercut.  Decompression then proceeded to each neural foramen in the proximal C5 nerve roots were identified.  At this point a very thorough decompression been achieved at that level.  At [facility] microscope was brought in the field again and using a high-speed drill remaining aspects of annulus and osteophytes removed down to level the posterior logical ligament.  Posterior logical is not elevated and resected.  Once again the underlying thecal sac was identified.  Wide central decompression was then performed undercutting the bodies of C5 and C6.  Decompression then proceeded to each neural foramen.  Wide anterior foraminotomies were performed on the course exiting C6 nerve roots bilaterally.  The procedure was then repeated in a similar fashion at C6-7 again without complication.  Wound was then irrigated fanlike solution.  Gelfoam placed topically then removed.  [employer]onic anatomic peek cages were then packed with locally harvested autograft and each cage was then impacted into all 3 interspaces and each cage was recessed slightly from the anterior cortical margin.  Medtronic Atlantis anterior cervical plate was then placed over the 4 to C7 levels.  This then attached under fluoroscopic guidance using 13 mm fixed angle screws to each at all 4 levels.  All 8 screws were given final tightening found to be solidly within the bone.  Locking screws engaged all 4 levels.  Final images reveal good position of the cages and the hardware at the  proper upper level with normal alignment of spine.  Wound was  irrigated one final time.  Hemostasis was assured with bipolar trocar.  Wounds closed in layers with Vicryl sutures.  Steri-Strips and sterile dressing were applied.  No apparent complications.  Patient tolerated the procedure well and she returns to the recovery room postop.

## 2018-05-21 NOTE — Anesthesia Procedure Notes (Signed)
Procedure Name: Intubation Date/Time: 05/21/2018 11:09 AM Performed by: Raenette Rover, CRNA Pre-anesthesia Checklist: Patient identified, Emergency Drugs available, Suction available and Patient being monitored Patient Re-evaluated:Patient Re-evaluated prior to induction Oxygen Delivery Method: Circle system utilized Preoxygenation: Pre-oxygenation with 100% oxygen Induction Type: IV induction Ventilation: Mask ventilation without difficulty Laryngoscope Size: Mac and 3 Grade View: Grade I Tube type: Oral Tube size: 7.0 mm Number of attempts: 1 Airway Equipment and Method: Stylet Placement Confirmation: ETT inserted through vocal cords under direct vision,  positive ETCO2,  CO2 detector and breath sounds checked- equal and bilateral Secured at: 21 cm Tube secured with: Tape Dental Injury: Teeth and Oropharynx as per pre-operative assessment
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
## 2018-05-21 NOTE — Progress Notes (Signed)
Spoke with dr Chaney Malling Ferne Reus re elevated sbp/dbp ( started preop 122/87) , orders rec'd

## 2018-05-21 NOTE — Transfer of Care (Signed)
Immediate Anesthesia Transfer of Care Note  Patient: CHARRISSE CH  Procedure(s) Performed: ANTERIOR CERVICAL DECOMPRESSION AND FUSION CERVICAL FOUR-FIVE,CERVICAL FIVE-SIX,CERVICAL SIX-SEVEN,REMOVAL OF MOBI-C IMPLANT. (N/A Spine Cervical)  Patient Location: PACU  Anesthesia Type:General  Level of Consciousness: drowsy and patient cooperative  Airway & Oxygen Therapy: Patient Spontanous Breathing and Patient connected to nasal cannula oxygen  Post-op Assessment: Report given to RN and Post -op Vital signs reviewed and stable  Post vital signs: Reviewed and stable  Last Vitals:  Vitals Value Taken Time  BP 178/106 05/21/2018  1:21 PM  Temp    Pulse 88 05/21/2018  1:23 PM  Resp 11 05/21/2018  1:23 PM  SpO2 99 % 05/21/2018  1:23 PM  Vitals shown include unvalidated device data.  Last Pain:  Vitals:   05/21/18 0954  TempSrc:   PainSc: 9       Patients Stated Pain Goal: 0 (05/21/18 0954)  Complications: No apparent anesthesia complications

## 2018-05-21 NOTE — Evaluation (Signed)
Physical Therapy Evaluation Patient Details Name: Nancy Bowers MRN: [id number] DOB: 21-Apr-1976 Today's Date: 05/21/2018   History of Present Illness  Patient is a 42 yo female s/p ANTERIOR CERVICAL DECOMPRESSION AND FUSION CERVICAL FOUR-FIVE,CERVICAL FIVE-SIX,CERVICAL SIX-SEVEN,REMOVAL OF MOBI-C IMPLANT. (N/A) - anterior. PMH BiPolar  Clinical Impression  Orders received for PT evaluation. Patient demonstrates deficits in functional mobility as indicated below. Will benefit from continued skilled PT to address deficits and maximize function. Will see as indicated and progress as tolerated.      Follow Up Recommendations Supervision/Assistance - 24 hour    Equipment Recommendations  None recommended by PT    Recommendations for Other Services       Precautions / Restrictions Precautions Precautions: Cervical Precaution Booklet Issued: Yes (comment) Required Braces or Orthoses: Cervical Brace Cervical Brace: Soft collar Restrictions Weight Bearing Restrictions: No      Mobility  Bed Mobility Overal bed mobility: Needs Assistance Bed Mobility: Rolling;Supine to Sit;Sit to Supine Rolling: Supervision   Supine to sit: Supervision Sit to supine: Min assist   General bed mobility comments: min assist to reposition back in bed  Transfers Overall transfer level: Needs assistance Equipment used: 1 person hand held assist Transfers: Sit to/from Stand Sit to Stand: Min assist         General transfer comment: min assist for safety and stability  Ambulation/Gait Ambulation/Gait assistance: Min assist Gait Distance (Feet): 110 Feet Assistive device: 1 person hand held assist;IV Pole Gait Pattern/deviations: Step-through pattern;Decreased stride length;Drifts right/left;Trunk flexed;Narrow base of support Gait velocity: decreased Gait velocity interpretation: <1.8 ft/sec, indicate of risk for recurrent falls General Gait Details: patient with noted instability and easily  distracted with ambulation  Stairs            Wheelchair Mobility    Modified Rankin (Stroke Patients Only)       Balance Overall balance assessment: Mild deficits observed, not formally tested                                           Pertinent Vitals/Pain Pain Assessment: 0-10 Pain Score: 10-Worst pain ever Pain Location: anterior neck, bilateral UEs and hands Pain Intervention(s): Monitored during session;Repositioned    Home Living Family/patient expects to be discharged to:: Private residence Living Arrangements: Parent;Spouse/significant other Available Help at Discharge: Family;Available 24 hours/day Type of Home: Mobile home Home Access: Stairs to enter Entrance Stairs-Rails: Left Entrance Stairs-Number of Steps: 8 Home Layout: One level Home Equipment: Walker - 2 wheels;Shower seat;Bedside commode Additional Comments: lives with parents, spouse works, 22 yo son, mother has planned cervical surgery 2/21 here at [facility]    Prior Function Level of Independence: Needs assistance   Gait / Transfers Assistance Needed: Husband assisting with mobility for the past few months since fall; prior to fall she was independent  ADL's / Homemaking Assistance Needed: Husband assisting with all ADL. Pt sitting in bottom of tub for bathing.        Hand Dominance   Dominant Hand: Right    Extremity/Trunk Assessment   Upper Extremity Assessment Upper Extremity Assessment: Defer to OT evaluation    Lower Extremity Assessment Lower Extremity Assessment: Generalized weakness       Communication   Communication: No difficulties  Cognition Arousal/Alertness: Awake/alert Behavior During Therapy: Anxious Overall Cognitive Status: History of cognitive impairments - at baseline  General Comments: bipolar       General Comments      Exercises     Assessment/Plan    PT Assessment Patient needs  continued PT services  PT Problem List Decreased strength;Decreased activity tolerance;Decreased mobility;Pain       PT Treatment Interventions DME instruction;Gait training;Stair training;Functional mobility training;Therapeutic activities;Therapeutic exercise;Balance training;Neuromuscular re-education;Cognitive remediation;Patient/family education    PT Goals (Current goals can be found in the Care Plan section)  Acute Rehab PT Goals Patient Stated Goal: to not hurt PT Goal Formulation: With patient/family Time For Goal Achievement: 06/04/18 Potential to Achieve Goals: Good    Frequency Min 5X/week   Barriers to discharge        Co-evaluation               AM-PAC PT "6 Clicks" Daily Activity  Outcome Measure Difficulty turning over in bed (including adjusting bedclothes, sheets and blankets)?: A Little Difficulty moving from lying on back to sitting on the side of the bed? : A Little Difficulty sitting down on and standing up from a chair with arms (e.g., wheelchair, bedside commode, etc,.)?: Unable Help needed moving to and from a bed to chair (including a wheelchair)?: A Little Help needed walking in hospital room?: A Little Help needed climbing 3-5 steps with a railing? : A Lot 6 Click Score: 15    End of Session Equipment Utilized During Treatment: Cervical collar Activity Tolerance: Patient limited by pain Patient left: in bed;with call bell/phone within reach;with family/visitor present Nurse Communication: Mobility status PT Visit Diagnosis: Difficulty in walking, not elsewhere classified (R26.2);Pain    Time: 9507-2257 PT Time Calculation (min) (ACUTE ONLY): 22 min   Charges:   PT Evaluation $PT Eval Moderate Complexity: 1 Mod          Charlotte Crumb, PT DPT  Board Certified Neurologic Specialist [phone]   Fabio Asa 05/21/2018, 5:16 PM

## 2018-05-21 NOTE — Progress Notes (Signed)
Orthopedic Tech Progress Note Patient Details:  Nancy Bowers [birth year] [id number]  Ortho Devices Type of Ortho Device: Soft collar Ortho Device/Splint Interventions: Application   Post Interventions Patient Tolerated: Well Instructions Provided: Care of device   [name] 05/21/2018, 3:17 PM

## 2018-05-21 NOTE — Progress Notes (Signed)
otw  3c , Nancy Bowers shares she has numbness and decreased sensation in both hands which has been present "more than a year". She also states this no worse after this surgery. Earlier during her assessment, she stated she had normal sensation.

## 2018-05-21 NOTE — Anesthesia Postprocedure Evaluation (Signed)
Anesthesia Post Note  Patient: Nancy Bowers  Procedure(s) Performed: ANTERIOR CERVICAL DECOMPRESSION AND FUSION CERVICAL FOUR-FIVE,CERVICAL FIVE-SIX,CERVICAL SIX-SEVEN,REMOVAL OF MOBI-C IMPLANT. (N/A Spine Cervical)     Patient location during evaluation: PACU Anesthesia Type: General Level of consciousness: awake and alert Pain management: pain level controlled Vital Signs Assessment: post-procedure vital signs reviewed and stable Respiratory status: spontaneous breathing, nonlabored ventilation, respiratory function stable and patient connected to nasal cannula oxygen Cardiovascular status: blood pressure returned to baseline and stable Postop Assessment: no apparent nausea or vomiting Anesthetic complications: no    Last Vitals:  Vitals:   05/21/18 1415 05/21/18 1447  BP: (!) 129/94 113/82  Pulse: 69 72  Resp: 17 (!) 22  Temp:    SpO2: 98% 97%    Last Pain:  Vitals:   05/21/18 1355  TempSrc:   PainSc: Asleep                 Onisha Cedeno S

## 2018-05-21 NOTE — Plan of Care (Signed)
  Problem: Safety: Goal: Ability to remain free from injury will improve Outcome: Progressing   

## 2018-05-21 NOTE — Anesthesia Preprocedure Evaluation (Signed)
Anesthesia Evaluation  Patient identified by MRN, date of birth, ID band Patient awake    Reviewed: Allergy & Precautions, H&P , NPO status , Patient's Chart, lab work & pertinent test results  Airway Mallampati: II   Neck ROM: full    Dental   Pulmonary asthma , COPD, Current Smoker,    breath sounds clear to auscultation       Cardiovascular negative cardio ROS   Rhythm:regular Rate:Normal     Neuro/Psych  Headaches, Anxiety Depression Bipolar Disorder    GI/Hepatic   Endo/Other    Renal/GU      Musculoskeletal  (+) Arthritis ,   Abdominal   Peds  Hematology   Anesthesia Other Findings   Reproductive/Obstetrics                             Anesthesia Physical Anesthesia Plan  ASA: III  Anesthesia Plan: General   Post-op Pain Management:    Induction: Intravenous  PONV Risk Score and Plan: 2 and Ondansetron, Dexamethasone, Midazolam and Treatment may vary due to age or medical condition  Airway Management Planned: Oral ETT  Additional Equipment:   Intra-op Plan:   Post-operative Plan: Extubation in OR  Informed Consent: I have reviewed the patients History and Physical, chart, labs and discussed the procedure including the risks, benefits and alternatives for the proposed anesthesia with the patient or authorized representative who has indicated his/her understanding and acceptance.     Plan Discussed with: CRNA, Anesthesiologist and Surgeon  Anesthesia Plan Comments:         Anesthesia Quick Evaluation

## 2018-05-21 NOTE — Brief Op Note (Signed)
05/21/2018  12:59 PM  PATIENT:  Nancy Bowers  42 y.o. female  PRE-OPERATIVE DIAGNOSIS:  Stenosis  POST-OPERATIVE DIAGNOSIS:  Stenosis  PROCEDURE:  Procedure(s) with comments: ANTERIOR CERVICAL DECOMPRESSION AND FUSION CERVICAL FOUR-FIVE,CERVICAL FIVE-SIX,CERVICAL SIX-SEVEN,REMOVAL OF MOBI-C IMPLANT. (N/A) - anterior  SURGEON:  Surgeon(s) and Role:    * Julio Sicks, MD - Primary    * Jadene Pierini, MD - Assisting  PHYSICIAN ASSISTANT:   ASSISTANTS:    ANESTHESIA:   general  EBL:  100 cc   BLOOD ADMINISTERED:none  DRAINS: none   LOCAL MEDICATIONS USED:  NONE  SPECIMEN:  No Specimen  DISPOSITION OF SPECIMEN:  N/A  COUNTS:  YES  TOURNIQUET:  * No tourniquets in log *  DICTATION: .Dragon Dictation  PLAN OF CARE: Admit to inpatient   PATIENT DISPOSITION:  PACU - hemodynamically stable.   Delay start of Pharmacological VTE agent (>24hrs) due to surgical blood loss or risk of bleeding: yes

## 2018-05-21 NOTE — Progress Notes (Signed)
Nancy Bowers has had a fair amount of pain meds during her pacu stay/ she is either restless and calling out or so somnolent she falls asleep while attempting to eat ice from a cup

## 2018-05-22 MED ORDER — OXYCODONE-ACETAMINOPHEN 10-325 MG PO TABS: 1.0000 | ORAL_TABLET | ORAL | 0 refills | Status: DC | PRN

## 2018-05-22 MED ORDER — CYCLOBENZAPRINE HCL 10 MG PO TABS: 10.0000 mg | ORAL_TABLET | Freq: Three times a day (TID) | ORAL | 0 refills | Status: DC | PRN

## 2018-05-22 MED FILL — Gelatin Absorbable Sponge Size 100: CUTANEOUS | Qty: 1 | Status: AC

## 2018-05-22 MED FILL — Thrombin (Recombinant) For Soln 20000 Unit: CUTANEOUS | Qty: 1 | Status: AC

## 2018-05-22 NOTE — Progress Notes (Signed)
Physical Therapy Treatment Patient Details Name: Nancy Bowers MRN: [id number] DOB: 1976/04/18 Today's Date: 05/22/2018    History of Present Illness Patient is a 42 yo female s/p ANTERIOR CERVICAL DECOMPRESSION AND FUSION CERVICAL FOUR-FIVE,CERVICAL FIVE-SIX,CERVICAL SIX-SEVEN,REMOVAL OF MOBI-C IMPLANT. (N/A) - anterior. PMH BiPolar    PT Comments    Pt progressing with goals, negotiating stairs with min assist for safety. Pt educated on positioning, car transfers, and generalized walking program. Pt requested use of BIL platform RW for ambulation, but demonstrated increased stability and safety with use of HHA+1 with pt reporting she will have support 24 hours of the day. Recommending use of RW if BIL UE numbness and tingling improves, which pt has access to at d/c. Will continue to follow acutely while admitted.     Follow Up Recommendations  No PT follow up;Supervision/Assistance - 24 hour     Equipment Recommendations  None recommended by PT    Recommendations for Other Services       Precautions / Restrictions Precautions Precautions: Cervical Precaution Comments: Reviewed precautions in full with pt and spouse Required Braces or Orthoses: Cervical Brace Cervical Brace: Soft collar;At all times Restrictions Weight Bearing Restrictions: No    Mobility  Bed Mobility               General bed mobility comments: seated EOB upon entry; Verbally reviewed log roll sequence, utilizing handout for a visual representation.   Transfers Overall transfer level: Needs assistance Equipment used: None Transfers: Sit to/from Stand Sit to Stand: Supervision         General transfer comment: Supervision for safety and technique; increased time and effort noted   Ambulation/Gait Ambulation/Gait assistance: Min assist Gait Distance (Feet): 300 Feet Assistive device: 1 person hand held assist Gait Pattern/deviations: Step-through pattern;Drifts right/left;Trunk flexed Gait  velocity: decreased Gait velocity interpretation: <1.8 ft/sec, indicate of risk for recurrent falls General Gait Details: Pt slow and guarded throughout gait. Required HHA+1 secondary to unsteasdiness and pain in R foot which she had at baseline. Pt reports she is unable to utilize cane or walker to assist ambulation secondary to numbness/tingling which worsens almost immediatly with grip. Min cues for safety and upright posture with gait.    Stairs Stairs: Yes Stairs assistance: Min assist;Min guard Stair Management: One rail Left;Step to pattern;Sideways;Forwards Number of Stairs: 10 General stair comments: Initially min g with pt ascending stairs sideways with stronger LE leading. Pt reported difficulty negotiating stairs sideways as Bil gripping provoked N/T. Pt also at risk for breaking precautions, with cues required to prevent twisting. Transitioned to ascending stairs forwards utilizing rail on left and HHA+1 on the right. Descended with rail on R and HHA+1 on pt's L with weaker LE leading. Min cues required throughout for both pt and spouse for safety and technique. Instructed pt's spouse on proper hand hold on gait belt as pt will be given a gait belt at d/c to maximize safety.   Wheelchair Mobility    Modified Rankin (Stroke Patients Only)       Balance Overall balance assessment: Needs assistance Sitting-balance support: No upper extremity supported;Feet supported Sitting balance-Leahy Scale: Good     Standing balance support: Single extremity supported;During functional activity Standing balance-Leahy Scale: Fair Standing balance comment: Pt required at least 1 UE support for static and dynamic standing activities.                             Cognition  Arousal/Alertness: Awake/alert Behavior During Therapy: WFL for tasks assessed/performed Overall Cognitive Status: History of cognitive impairments - at baseline                                  General Comments: Recalled 2/3 precautions with min cues required to recall the last      Exercises      General Comments General comments (skin integrity, edema, etc.): Verbally instructed pt's father on proper guarding and demonstrated safe grip for HHA when assisting pt with stairs, as pt's father presented at end of session. Pt given a gait belt to improve safety at d/c.      Pertinent Vitals/Pain Pain Assessment: Faces Faces Pain Scale: Hurts little more Pain Location: anterior neck, bilateral UEs and hands Pain Descriptors / Indicators: Tingling;Sore;Pins and needles;Grimacing;Operative site guarding;Numbness Pain Intervention(s): Limited activity within patient's tolerance;Monitored during session;Repositioned    Home Living                      Prior Function            PT Goals (current goals can now be found in the care plan section) Acute Rehab PT Goals Patient Stated Goal: to be able to use my hands more PT Goal Formulation: With patient/family Time For Goal Achievement: 06/04/18 Potential to Achieve Goals: Good Progress towards PT goals: Progressing toward goals    Frequency    Min 5X/week      PT Plan Current plan remains appropriate    Co-evaluation              AM-PAC PT "6 Clicks" Daily Activity  Outcome Measure  Difficulty turning over in bed (including adjusting bedclothes, sheets and blankets)?: A Little Difficulty moving from lying on back to sitting on the side of the bed? : A Little Difficulty sitting down on and standing up from a chair with arms (e.g., wheelchair, bedside commode, etc,.)?: Unable Help needed moving to and from a bed to chair (including a wheelchair)?: A Little Help needed walking in hospital room?: A Little Help needed climbing 3-5 steps with a railing? : A Little 6 Click Score: 16    End of Session Equipment Utilized During Treatment: Cervical collar;Gait belt Activity Tolerance: Patient tolerated  treatment well Patient left: in chair;with call bell/phone within reach;with family/visitor present Nurse Communication: Mobility status;Other (comment)(Pt requesting RN look at dressing once more before d/c) PT Visit Diagnosis: Unsteadiness on feet (R26.81);Difficulty in walking, not elsewhere classified (R26.2);Pain Pain - part of body: (neck)     Time: 1610-9604 PT Time Calculation (min) (ACUTE ONLY): 34 min  Charges:  $Gait Training: 23-37 mins                     Donzetta Kohut, [place]  Student Physical Therapist Acute Rehab [phone]    Donzetta Kohut 05/22/2018, 3:00 PM

## 2018-05-22 NOTE — Evaluation (Addendum)
Occupational Therapy Evaluation Patient Details Name: Nancy Bowers MRN: [id number] DOB: 10-Dec-1975 Today's Date: 05/22/2018    History of Present Illness Patient is a 42 yo female s/p ANTERIOR CERVICAL DECOMPRESSION AND FUSION CERVICAL FOUR-FIVE,CERVICAL FIVE-SIX,CERVICAL SIX-SEVEN,REMOVAL OF MOBI-C IMPLANT. (N/A) - anterior. PMH BiPolar   Clinical Impression   PTA patient reports needing some assist with ADLs and mobility "on her bad days", due to decreased balance, impaired coordination/sensation in her hands.  Currently, she is able to complete UB/LB ADL with min assist, grooming with setup assist, toilet transfers with min guard assist and short distance mobility with min guard assist.  Patient educated on precautions, brace wear schedule and safety, exercises (with putty), AE and modified techniques for ADLs, mobility and safety. She continues to have numbness in B hands, but patient reports "it was better earlier" and demonstrates ability to feed self and hold items without dropping, and agreeable to follow up with MD for possible outpatient OT if needed at post op appointment. Agreeable to have shower chair for shower (reports 3:1 will not work) and to have supervision for mobility/ADLs.  At this time, all patient needs have been met acutely; patient plans on dc home with family support and has no further questions of concerns. OT signing off, if any further needs arise please re-consult! Thank you!      Follow Up Recommendations  Follow surgeon's recommendation for DC plan and follow-up therapies;Supervision/Assistance - 24 hour    Equipment Recommendations  Tub/shower seat    Recommendations for Other Services       Precautions / Restrictions Precautions Precautions: Cervical Precaution Booklet Issued: Yes (comment) Required Braces or Orthoses: Cervical Brace Cervical Brace: Soft collar;At all times Restrictions Weight Bearing Restrictions: No      Mobility Bed Mobility               General bed mobility comments: seated EOB upon entry  Transfers Overall transfer level: Needs assistance Equipment used: None Transfers: Sit to/from Stand Sit to Stand: Supervision         General transfer comment: supervision for safety     Balance Overall balance assessment: Mild deficits observed, not formally tested                                         ADL either performed or assessed with clinical judgement   ADL Overall ADL's : Needs assistance/impaired Eating/Feeding: Set up;Sitting;With adaptive utensils Eating/Feeding Details (indicate cue type and reason): educated on techniques, issued foam tubing for B hands  Grooming: Set up;Sitting   Upper Body Bathing: Set up;Sitting   Lower Body Bathing: Minimal assistance;Sit to/from stand;Cueing for back precautions;Cueing for compensatory techniques   Upper Body Dressing : Sitting;Minimal assistance Upper Body Dressing Details (indicate cue type and reason): requires assistance with pulling over bra from spouses, reviewed compensatory technqiues Lower Body Dressing: Minimal assistance;Sit to/from stand;Cueing for back precautions Lower Body Dressing Details (indicate cue type and reason): cueing for back preacutions and educated on compensatory techniques for LB dressing, able to complete figure 4 technique with supervision  Toilet Transfer: Min guard;Ambulation(simulated in room)   Toileting- Clothing Manipulation and Hygiene: Supervision/safety;Sit to/from stand   Tub/ Shower Transfer: Min guard;Cueing for safety;Ambulation;Shower seat;Tub Product manager Details (indicate cue type and reason): min guard for safety and balance Functional mobility during ADLs: Min guard General ADL Comments: redirection throughout session, educated on cervical  back precautions and ADL compensatory techniques      Vision   Vision Assessment?: No apparent visual deficits     Perception      Praxis      Pertinent Vitals/Pain Pain Assessment: 0-10 Pain Score: 9  Pain Location: anterior neck, bilateral UEs and hands Pain Intervention(s): Monitored during session     Hand Dominance Right   Extremity/Trunk Assessment Upper Extremity Assessment Upper Extremity Assessment: RUE deficits/detail;LUE deficits/detail;Generalized weakness RUE Deficits / Details: 3+/5 grossly  RUE Sensation: decreased light touch RUE Coordination: decreased fine motor LUE Deficits / Details: 3+/5 grossly LUE Sensation: decreased light touch LUE Coordination: decreased fine motor   Lower Extremity Assessment Lower Extremity Assessment: Defer to PT evaluation   Cervical / Trunk Assessment Cervical / Trunk Assessment: Other exceptions Cervical / Trunk Exceptions: s/p cervical sx   Communication Communication Communication: No difficulties   Cognition Arousal/Alertness: Awake/alert Behavior During Therapy: Anxious Overall Cognitive Status: History of cognitive impairments - at baseline                                 General Comments: bipolar    General Comments  issued theraputty and reviewed techniques for use, educated patinet on home safety and exercises, follow up with surgeon for OP OT    Exercises     Shoulder Instructions      Home Living Family/patient expects to be discharged to:: Private residence Living Arrangements: Parent;Spouse/significant other Available Help at Discharge: Family;Available 24 hours/day Type of Home: Mobile home Home Access: Stairs to enter Entrance Stairs-Number of Steps: 8 Entrance Stairs-Rails: Left Home Layout: One level     Bathroom Shower/Tub: Tub/shower unit;Curtain   Bathroom Toilet: Standard Bathroom Accessibility: No   Home Equipment: Environmental consultant - 2 wheels;Bedside commode   Additional Comments: lives with parents, spouse works, 19 yo son, mother has planned cervical surgery 2/21 here at [facility]      Prior  Functioning/Environment Level of Independence: Needs assistance  Gait / Transfers Assistance Needed: Husband assisting with mobility for the past few months since fall; prior to fall she was independent ADL's / Homemaking Assistance Needed: reports some assist with donning bra, bathing on "bad days"            OT Problem List: Decreased strength;Decreased activity tolerance;Impaired balance (sitting and/or standing);Decreased cognition;Decreased safety awareness;Decreased knowledge of precautions;Impaired UE functional use;Pain      OT Treatment/Interventions:      OT Goals(Current goals can be found in the care plan section) Acute Rehab OT Goals Patient Stated Goal: to be able to use my hands more OT Goal Formulation: With patient  OT Frequency:     Barriers to D/C:            Co-evaluation              AM-PAC PT "6 Clicks" Daily Activity     Outcome Measure Help from another person eating meals?: None Help from another person taking care of personal grooming?: None Help from another person toileting, which includes using toliet, bedpan, or urinal?: A Little Help from another person bathing (including washing, rinsing, drying)?: A Little Help from another person to put on and taking off regular upper body clothing?: A Little Help from another person to put on and taking off regular lower body clothing?: A Little 6 Click Score: 20   End of Session Equipment Utilized During Treatment: Cervical collar Nurse Communication: Mobility status;Other (  comment)(pt needs)  Activity Tolerance: Patient tolerated treatment well Patient left: with call bell/phone within reach;with family/visitor present(seated EOB)  OT Visit Diagnosis: Other abnormalities of gait and mobility (R26.89);Muscle weakness (generalized) (M62.81);Pain Pain - part of body: (neck, UE)                Time: 3662-9476 OT Time Calculation (min): 24 min Charges:  OT General Charges $OT Visit: 1 Visit OT  Evaluation $OT Eval Moderate Complexity: 1 Mod OT Treatments $Self Care/Home Management : 8-22 mins  Delight Stare, OTR/L  Pager [place] 05/22/2018, 8:43 AM

## 2018-05-22 NOTE — Progress Notes (Signed)
Patient is discharged from room 3C08 at this time. Alert and in stable condition. IV site d/c'd and instructions read to patient and family with understanding verbalized. Left unit via wheelchair with all belongings at side. 

## 2018-05-22 NOTE — Discharge Summary (Signed)
Physician Discharge Summary  Patient ID: Nancy Bowers MRN: [id number] DOB/AGE: [birth year] 42 y.o.  Admit date: 05/21/2018 Discharge date: 05/22/2018  Admission Diagnoses:  Discharge Diagnoses:  Active Problems:   Cervical myelopathy [facility])   Discharged Condition: good  Hospital Course: Patient admitted to the hospital where she underwent an uncomplicated 3 level anterior cervical decompression and fusion.  Postoperatively patient doing well.  Upper and lower extremity strength improved.  Sensation better.  Ambulating better.  Swallowing well.  Voice strong.  Consults:   Significant Diagnostic Studies:   Treatments:   Discharge Exam: Blood pressure 129/88, pulse 91, temperature 98.8 F (37.1 C), temperature source Oral, resp. rate 18, height 5\' 5"  (1.651 m), weight 67 kg, last menstrual period 05/09/2018, SpO2 97 %. Awake and alert.  Oriented and appropriate.  Motor examination with mild distal weakness in both upper extremities improved from preop.  Sensory examination nonfocal.  Wound clean and dry.  Chest and abdomen benign.  Disposition: Discharge disposition: 01-Home or Self Care        Allergies as of 05/22/2018      Reactions   Aspirin Swelling   throat   Bee Venom Hives   Fentanyl Rash   Tylenol [acetaminophen] Swelling   Tolerates percocet and norco      Medication List    TAKE these medications   albuterol 108 (90 Base) MCG/ACT inhaler Commonly known as:  PROVENTIL HFA;VENTOLIN HFA Inhale 1-2 puffs into the lungs every 4 (four) hours as needed for wheezing or shortness of breath.   amphetamine-dextroamphetamine 30 MG tablet Commonly known as:  ADDERALL Take 30 mg by mouth 2 (two) times daily.   BIOFREEZE EX Apply 1 application topically 5 (five) times daily as needed (pain).   carisoprodol 350 MG tablet Commonly known as:  SOMA Take 350 mg by mouth 2 (two) times daily.   cetirizine 10 MG tablet Commonly known as:  ZYRTEC Take 10 mg by mouth  daily.   clonazePAM 0.5 MG tablet Commonly known as:  KLONOPIN Take 1 tablet (0.5 mg total) by mouth 4 (four) times daily as needed for anxiety.   cyclobenzaprine 10 MG tablet Commonly known as:  FLEXERIL Take 1 tablet (10 mg total) by mouth 3 (three) times daily as needed for muscle spasms.   doxycycline 100 MG tablet Commonly known as:  VIBRA-TABS Take 1 tablet (100 mg total) by mouth 2 (two) times daily.   fluticasone 50 MCG/ACT nasal spray Commonly known as:  FLONASE Place 1 spray into both nostrils daily as needed for allergies.   gabapentin 400 MG capsule Commonly known as:  NEURONTIN Take 400 mg by mouth 3 (three) times daily.   ibuprofen 800 MG tablet Commonly known as:  ADVIL,MOTRIN Take 1,600 mg by mouth 2 (two) times daily.   Iron 28 MG Tabs Take 28 mg by mouth daily.   lamoTRIgine 100 MG tablet Commonly known as:  LAMICTAL Take 2.5 tablets (250 mg total) by mouth daily.   lurasidone 80 MG Tabs tablet Commonly known as:  LATUDA Take 1 tablet (80 mg total) by mouth every evening.   oxyCODONE-acetaminophen 10-325 MG tablet Commonly known as:  PERCOCET Take 1 tablet by mouth every 4 (four) hours as needed for pain. What changed:  when to take this            Durable Medical Equipment  (From admission, onward)         Start     Ordered   05/22/18 0805  For  home use only DME Shower stool  Once     05/22/18 0804           Signed: Sherilyn Cooter A Alvie Fowles 05/22/2018, 8:09 AM

## 2018-05-22 NOTE — Discharge Instructions (Signed)

## 2018-05-23 ENCOUNTER — Encounter (HOSPITAL_COMMUNITY): Payer: Self-pay | Admitting: Neurosurgery

## 2018-06-06 ENCOUNTER — Encounter (HOSPITAL_COMMUNITY): Payer: Self-pay | Admitting: Psychiatry

## 2018-06-06 ENCOUNTER — Ambulatory Visit (INDEPENDENT_AMBULATORY_CARE_PROVIDER_SITE_OTHER): Payer: Medicaid Other | Admitting: Psychiatry

## 2018-06-06 DIAGNOSIS — F17210 Nicotine dependence, cigarettes, uncomplicated: Secondary | ICD-10-CM

## 2018-06-06 DIAGNOSIS — F319 Bipolar disorder, unspecified: Secondary | ICD-10-CM

## 2018-06-06 DIAGNOSIS — F411 Generalized anxiety disorder: Secondary | ICD-10-CM | POA: Diagnosis not present

## 2018-06-06 DIAGNOSIS — Z818 Family history of other mental and behavioral disorders: Secondary | ICD-10-CM

## 2018-06-06 DIAGNOSIS — Z79899 Other long term (current) drug therapy: Secondary | ICD-10-CM

## 2018-06-06 MED ORDER — LAMOTRIGINE 100 MG PO TABS: ORAL_TABLET | ORAL | 1 refills | Status: DC

## 2018-06-06 MED ORDER — CLONAZEPAM 0.5 MG PO TABS: 0.5000 mg | ORAL_TABLET | Freq: Four times a day (QID) | ORAL | 0 refills | Status: DC | PRN
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
## 2018-06-20 ENCOUNTER — Ambulatory Visit (HOSPITAL_COMMUNITY): Payer: Medicaid Other | Admitting: Psychiatry

## 2018-06-27 ENCOUNTER — Encounter (HOSPITAL_COMMUNITY): Payer: Self-pay | Admitting: Psychiatry

## 2018-06-27 ENCOUNTER — Ambulatory Visit (INDEPENDENT_AMBULATORY_CARE_PROVIDER_SITE_OTHER): Payer: Medicaid Other | Admitting: Psychiatry

## 2018-06-27 DIAGNOSIS — F411 Generalized anxiety disorder: Secondary | ICD-10-CM

## 2018-06-27 DIAGNOSIS — F319 Bipolar disorder, unspecified: Secondary | ICD-10-CM | POA: Diagnosis not present

## 2018-06-27 DIAGNOSIS — F900 Attention-deficit hyperactivity disorder, predominantly inattentive type: Secondary | ICD-10-CM | POA: Diagnosis not present

## 2018-06-27 MED ORDER — LAMOTRIGINE 100 MG PO TABS: 300.0000 mg | ORAL_TABLET | Freq: Every day | ORAL | 1 refills | Status: DC

## 2018-06-27 MED ORDER — CLONAZEPAM 0.5 MG PO TABS: 0.5000 mg | ORAL_TABLET | Freq: Four times a day (QID) | ORAL | 0 refills | Status: DC | PRN
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
## 2018-06-28 ENCOUNTER — Encounter (HOSPITAL_COMMUNITY): Payer: Self-pay | Admitting: Emergency Medicine

## 2018-06-28 ENCOUNTER — Emergency Department (HOSPITAL_COMMUNITY)
Admission: EM | Admit: 2018-06-28 | Discharge: 2018-06-29 | Disposition: A | Discharge status: Home / Self Care | Payer: Medicaid Other | Attending: Emergency Medicine | Admitting: Emergency Medicine

## 2018-06-28 DIAGNOSIS — Z79899 Other long term (current) drug therapy: Secondary | ICD-10-CM | POA: Diagnosis not present

## 2018-06-28 DIAGNOSIS — J449 Chronic obstructive pulmonary disease, unspecified: Secondary | ICD-10-CM | POA: Diagnosis not present

## 2018-06-28 DIAGNOSIS — H60501 Unspecified acute noninfective otitis externa, right ear: Secondary | ICD-10-CM

## 2018-06-28 DIAGNOSIS — M25512 Pain in left shoulder: Secondary | ICD-10-CM

## 2018-06-28 DIAGNOSIS — H608X1 Other otitis externa, right ear: Secondary | ICD-10-CM | POA: Insufficient documentation

## 2018-06-28 DIAGNOSIS — F17210 Nicotine dependence, cigarettes, uncomplicated: Secondary | ICD-10-CM | POA: Insufficient documentation

## 2018-06-28 NOTE — ED Triage Notes (Signed)
Pt has multiple complaints such as "feels like things are crawling all over me...  Feels like things are biting me... When I wash my hair my head does not get wet..."  She also reports "my mom had bacteria in her stomach and I want to make sure I don't have any."  Also complains of back, neck and arm pain but states "I just had all that checked out and they said I'm good."

## 2018-06-29 ENCOUNTER — Emergency Department (HOSPITAL_COMMUNITY): Payer: Medicaid Other

## 2018-06-29 MED ORDER — CIPROFLOXACIN-DEXAMETHASONE 0.3-0.1 % OT SUSP
4.0000 [drp] | Freq: Two times a day (BID) | OTIC | Status: DC
  Administered 2018-06-29: 4 [drp] via OTIC
  Filled 2018-06-29 (×2): qty 7.5
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
## 2018-08-13 ENCOUNTER — Other Ambulatory Visit (HOSPITAL_COMMUNITY): Payer: Self-pay

## 2018-08-13 DIAGNOSIS — F319 Bipolar disorder, unspecified: Secondary | ICD-10-CM

## 2018-08-13 DIAGNOSIS — F411 Generalized anxiety disorder: Secondary | ICD-10-CM

## 2018-08-13 MED ORDER — CLONAZEPAM 0.5 MG PO TABS: 0.5000 mg | ORAL_TABLET | Freq: Four times a day (QID) | ORAL | 0 refills | Status: DC | PRN

## 2018-08-22 ENCOUNTER — Ambulatory Visit (HOSPITAL_COMMUNITY): Payer: Medicaid Other | Admitting: Psychiatry

## 2018-08-26 ENCOUNTER — Telehealth (HOSPITAL_COMMUNITY): Payer: Self-pay

## 2018-08-26 NOTE — Telephone Encounter (Signed)
Patient called, she rescheduled her appointment to February and would like to know if she can get 90 day refills on all her medications.

## 2018-08-29 ENCOUNTER — Other Ambulatory Visit (HOSPITAL_COMMUNITY): Payer: Self-pay | Admitting: Psychiatry

## 2018-08-29 ENCOUNTER — Ambulatory Visit (HOSPITAL_COMMUNITY): Payer: Medicaid Other | Admitting: Psychiatry

## 2018-08-29 DIAGNOSIS — F319 Bipolar disorder, unspecified: Secondary | ICD-10-CM

## 2018-08-29 DIAGNOSIS — F411 Generalized anxiety disorder: Secondary | ICD-10-CM

## 2018-08-29 MED ORDER — CLONAZEPAM 0.5 MG PO TABS: 0.5000 mg | ORAL_TABLET | Freq: Three times a day (TID) | ORAL | 1 refills | Status: DC | PRN

## 2018-08-29 MED ORDER — LAMOTRIGINE 100 MG PO TABS: 300.0000 mg | ORAL_TABLET | Freq: Every day | ORAL | 1 refills | Status: DC

## 2018-08-29 NOTE — Progress Notes (Signed)
Pt missed her last appt and is asking for refills till next scheduled visit in Feb 2020

## 2018-09-26 ENCOUNTER — Telehealth (HOSPITAL_COMMUNITY): Payer: Self-pay

## 2018-09-26 NOTE — Telephone Encounter (Signed)
Patient called to see if you reviewed the paperwork and would like a prescription for Adderall. She follows up in February. Please review and advise, thank you

## 2018-09-27 ENCOUNTER — Other Ambulatory Visit (HOSPITAL_COMMUNITY): Payer: Self-pay | Admitting: Psychiatry

## 2018-09-27 DIAGNOSIS — F319 Bipolar disorder, unspecified: Secondary | ICD-10-CM

## 2018-10-10 ENCOUNTER — Other Ambulatory Visit (HOSPITAL_COMMUNITY): Payer: Self-pay | Admitting: Psychiatry

## 2018-10-10 DIAGNOSIS — F319 Bipolar disorder, unspecified: Secondary | ICD-10-CM

## 2018-10-10 DIAGNOSIS — F411 Generalized anxiety disorder: Secondary | ICD-10-CM

## 2018-10-31 ENCOUNTER — Encounter (HOSPITAL_COMMUNITY): Payer: Self-pay | Admitting: Psychiatry

## 2018-10-31 ENCOUNTER — Ambulatory Visit (INDEPENDENT_AMBULATORY_CARE_PROVIDER_SITE_OTHER): Payer: Medicaid Other | Admitting: Psychiatry

## 2018-10-31 VITALS — BP 126/80 | HR 74 | Ht 65.0 in | Wt 153.0 lb

## 2018-10-31 DIAGNOSIS — F411 Generalized anxiety disorder: Secondary | ICD-10-CM | POA: Diagnosis not present

## 2018-10-31 DIAGNOSIS — F319 Bipolar disorder, unspecified: Secondary | ICD-10-CM | POA: Diagnosis not present

## 2018-10-31 DIAGNOSIS — F900 Attention-deficit hyperactivity disorder, predominantly inattentive type: Secondary | ICD-10-CM

## 2018-10-31 MED ORDER — LAMOTRIGINE 100 MG PO TABS: 300.0000 mg | ORAL_TABLET | Freq: Every day | ORAL | 0 refills | Status: DC

## 2018-10-31 MED ORDER — ATOMOXETINE HCL 40 MG PO CAPS: 40.0000 mg | ORAL_CAPSULE | Freq: Every day | ORAL | 2 refills | Status: DC

## 2018-10-31 MED ORDER — CLONAZEPAM 0.5 MG PO TABS: 0.5000 mg | ORAL_TABLET | Freq: Three times a day (TID) | ORAL | 1 refills | Status: DC | PRN
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
## 2018-11-04 ENCOUNTER — Telehealth (HOSPITAL_COMMUNITY): Payer: Self-pay

## 2018-11-04 ENCOUNTER — Other Ambulatory Visit (HOSPITAL_COMMUNITY): Payer: Self-pay

## 2018-11-04 DIAGNOSIS — F319 Bipolar disorder, unspecified: Secondary | ICD-10-CM

## 2018-11-04 MED ORDER — LAMOTRIGINE 100 MG PO TABS: 300.0000 mg | ORAL_TABLET | Freq: Every day | ORAL | 0 refills | Status: DC

## 2018-11-04 NOTE — Telephone Encounter (Signed)
Patient called, the Lamictal was only sent in for a 10 day supply, I resent for 90 days. Patient also states that the Strattera is "messing with her nerves", she reports a stiffening in her hand - that she woke up and it was locked in position and she could not move it. Please review and advise, thank you

## 2018-11-07 NOTE — Telephone Encounter (Signed)
Tell her to stop the Strattera

## 2018-11-09 NOTE — Telephone Encounter (Signed)
I have attempted to call patient several times, she has no voicemail set up. I will advise her when she calls back.

## 2018-12-09 ENCOUNTER — Other Ambulatory Visit (HOSPITAL_COMMUNITY): Payer: Self-pay | Admitting: Psychiatry

## 2018-12-09 DIAGNOSIS — F319 Bipolar disorder, unspecified: Secondary | ICD-10-CM

## 2018-12-09 DIAGNOSIS — F411 Generalized anxiety disorder: Secondary | ICD-10-CM

## 2019-01-08 ENCOUNTER — Telehealth (HOSPITAL_COMMUNITY): Payer: Self-pay

## 2019-01-08 ENCOUNTER — Other Ambulatory Visit (HOSPITAL_COMMUNITY): Payer: Self-pay

## 2019-01-08 DIAGNOSIS — F411 Generalized anxiety disorder: Secondary | ICD-10-CM

## 2019-01-08 DIAGNOSIS — F319 Bipolar disorder, unspecified: Secondary | ICD-10-CM

## 2019-01-08 MED ORDER — CLONAZEPAM 0.5 MG PO TABS: 0.5000 mg | ORAL_TABLET | Freq: Three times a day (TID) | ORAL | 0 refills | Status: DC | PRN

## 2019-01-08 NOTE — Telephone Encounter (Signed)
Patient called requesting refill on her Klonopin 0.5 mg. Called in to pharmacy

## 2019-01-30 ENCOUNTER — Ambulatory Visit (INDEPENDENT_AMBULATORY_CARE_PROVIDER_SITE_OTHER): Payer: Medicaid Other | Admitting: Psychiatry

## 2019-01-30 ENCOUNTER — Other Ambulatory Visit: Payer: Self-pay

## 2019-01-30 ENCOUNTER — Encounter (HOSPITAL_COMMUNITY): Payer: Self-pay | Admitting: Psychiatry

## 2019-01-30 DIAGNOSIS — F319 Bipolar disorder, unspecified: Secondary | ICD-10-CM

## 2019-01-30 DIAGNOSIS — F411 Generalized anxiety disorder: Secondary | ICD-10-CM

## 2019-01-30 MED ORDER — DESVENLAFAXINE SUCCINATE ER 50 MG PO TB24: 50.0000 mg | ORAL_TABLET | Freq: Every day | ORAL | 1 refills | Status: DC

## 2019-01-30 MED ORDER — CLONAZEPAM 0.5 MG PO TABS: 0.5000 mg | ORAL_TABLET | Freq: Three times a day (TID) | ORAL | 1 refills | Status: DC | PRN

## 2019-01-30 MED ORDER — LAMOTRIGINE 100 MG PO TABS: 300.0000 mg | ORAL_TABLET | Freq: Every day | ORAL | 1 refills | Status: DC
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
## 2019-02-18 IMAGING — RF DG C-ARM 61-120 MIN
1 series · 2 of 2 positions shown · non-contrast
Comparison: MRI 05/07/2017.

CLINICAL DATA: C4-C5 fusion.

EXAM:
DG C-ARM 61-120 MIN; CERVICAL SPINE - 2-3 VIEW

[Series 1: run · 2 of 2 slices shown]
[im 1/2]
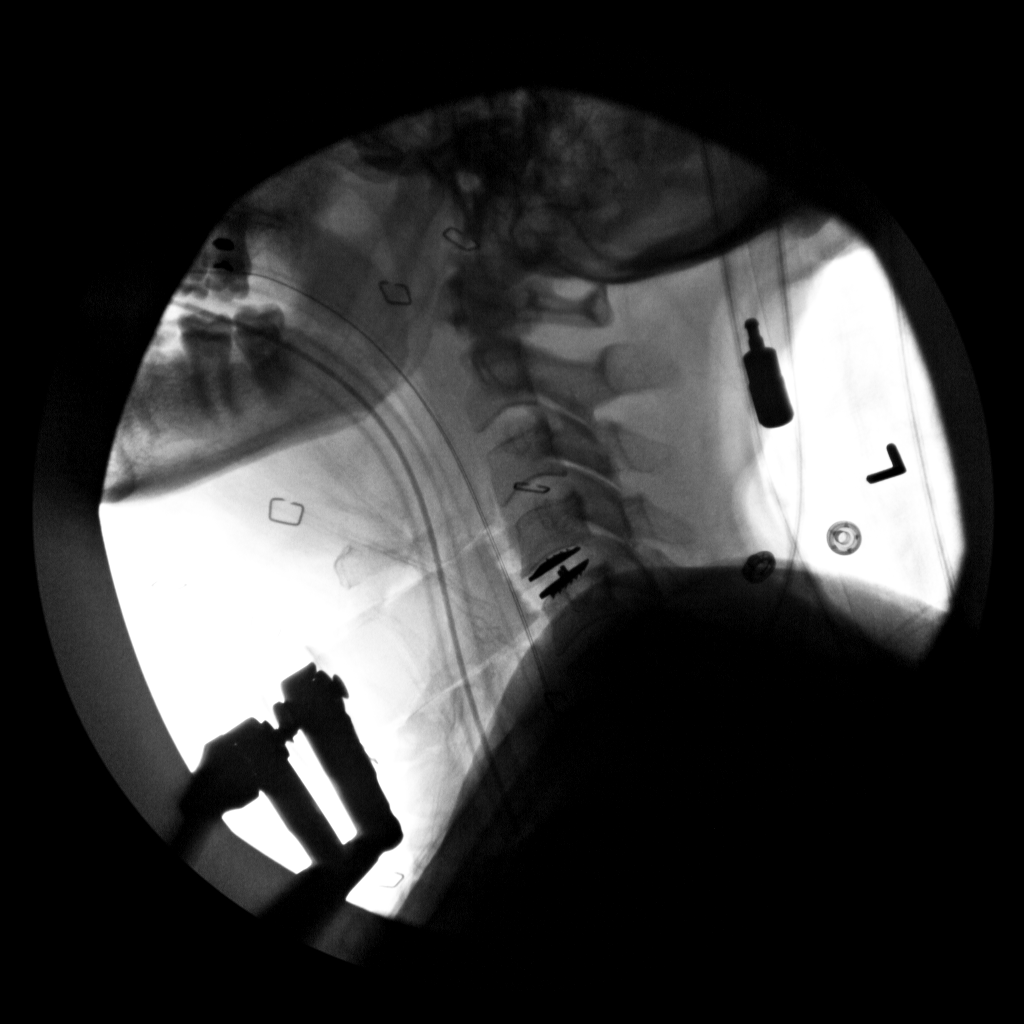
[im 2/2]
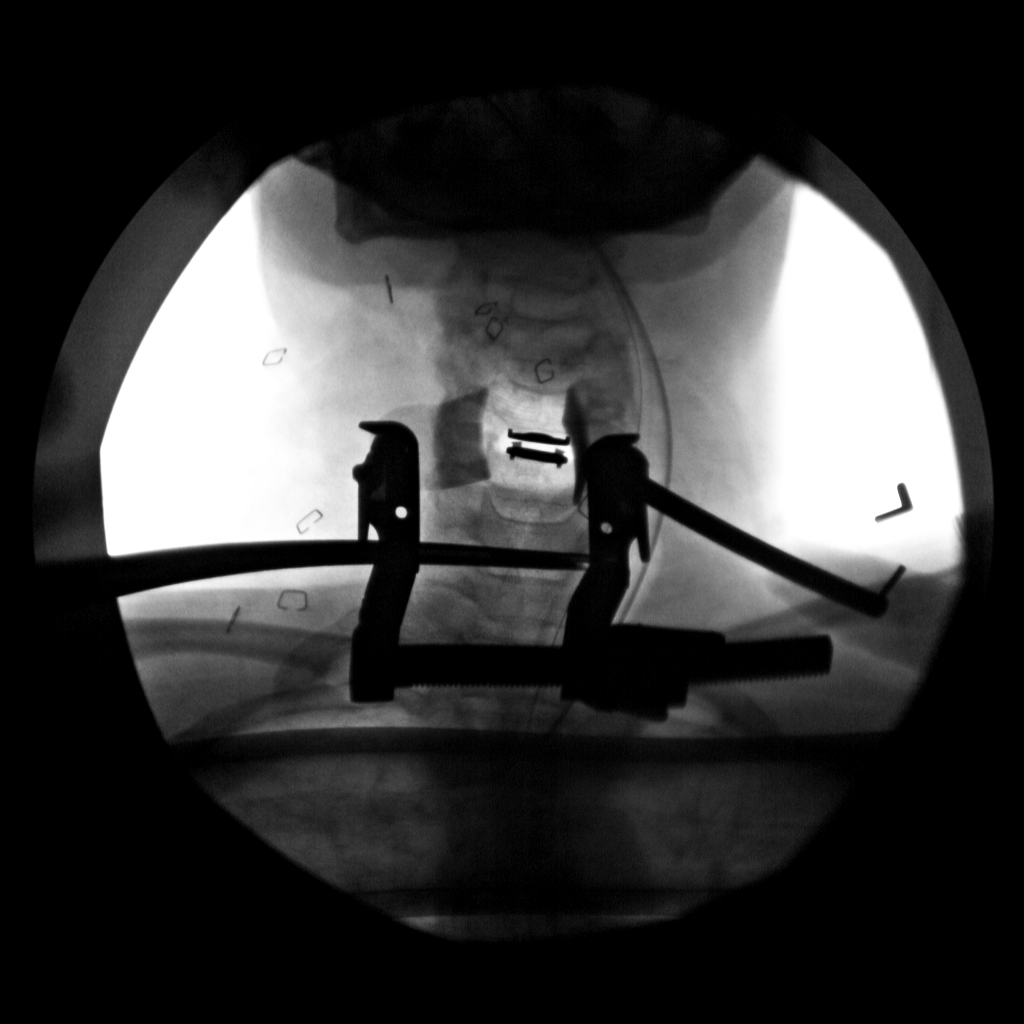

[2 of 2 positions shown; findings below may reference images not displayed]

FINDINGS: C4-C5 inter body fusion. No acute bony abnormality identified.
Anatomic alignment. Two views. 0 minutes 19 seconds fluoroscopy
time.
IMPRESSION: C4-C5 fusion.

## 2019-02-22 IMAGING — MR MR KNEE*R* W/O CM
4 of 5 series · 16 of 40 positions shown · non-contrast
Comparison: Radiographs 04/29/2017

CLINICAL DATA: Right knee pain.  History of recent falls.

EXAM:
MRI OF THE RIGHT KNEE WITHOUT CONTRAST
TECHNIQUE: Multiplanar, multisequence MR imaging of the knee was performed. No
intravenous contrast was administered.

[Series 3: PD · axial · 4.0mm · 0.50mm/px · z∈[-65,+21]mm · 3 of 24 slices shown (1 of 2)]
[im 3/24]
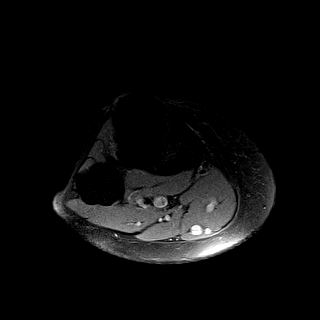
[im 13/24]
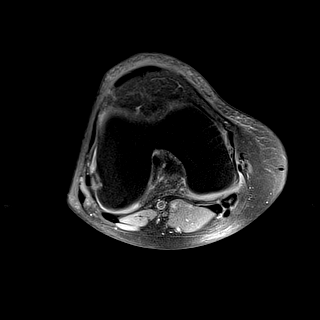
[im 21/24]
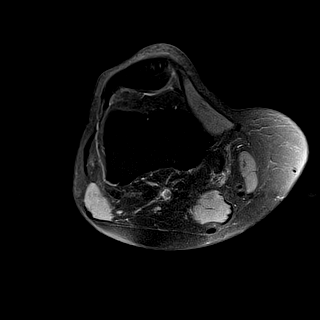

[Series 4: PD fat-sat · sagittal · 3.5mm · 0.21mm/px · 7 of 23 slices shown]
[im 1/23]
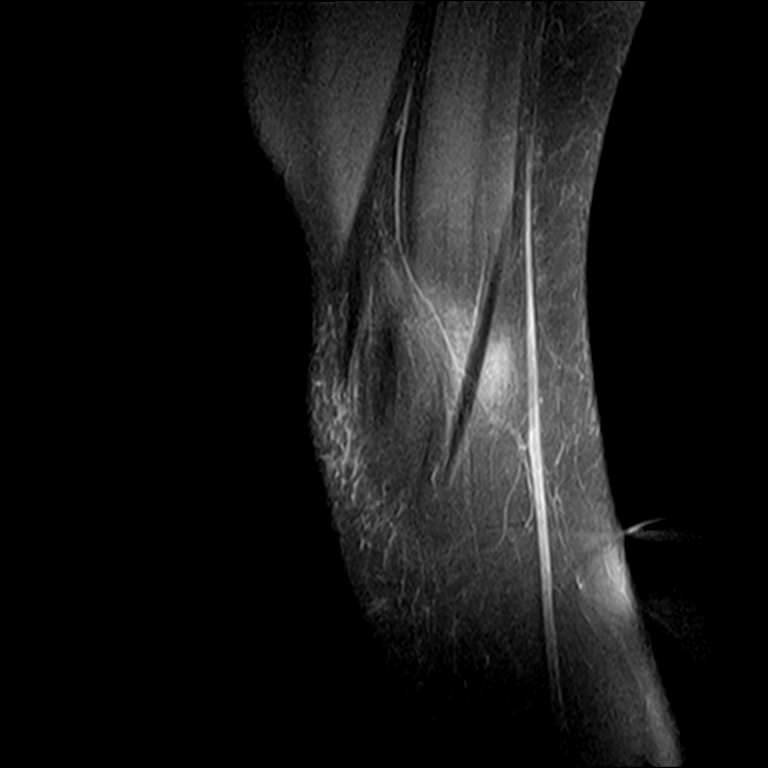
[im 3/23]
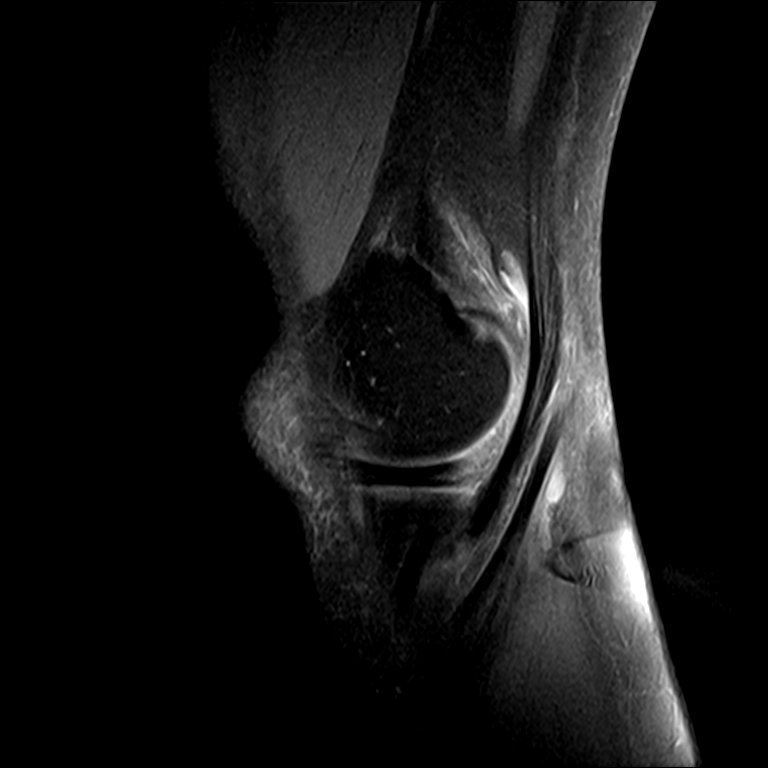
[im 6/23]
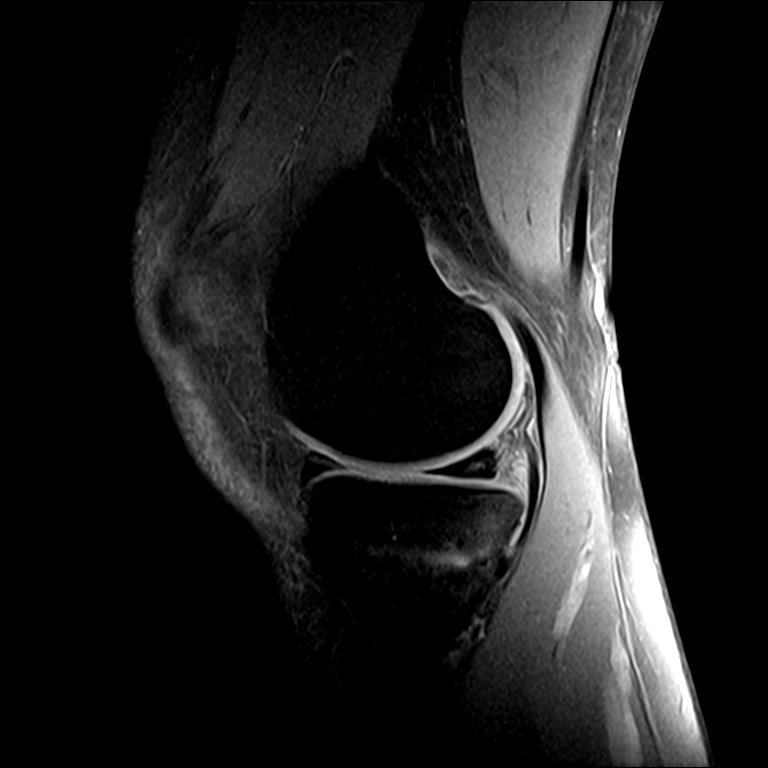
[im 9/23]
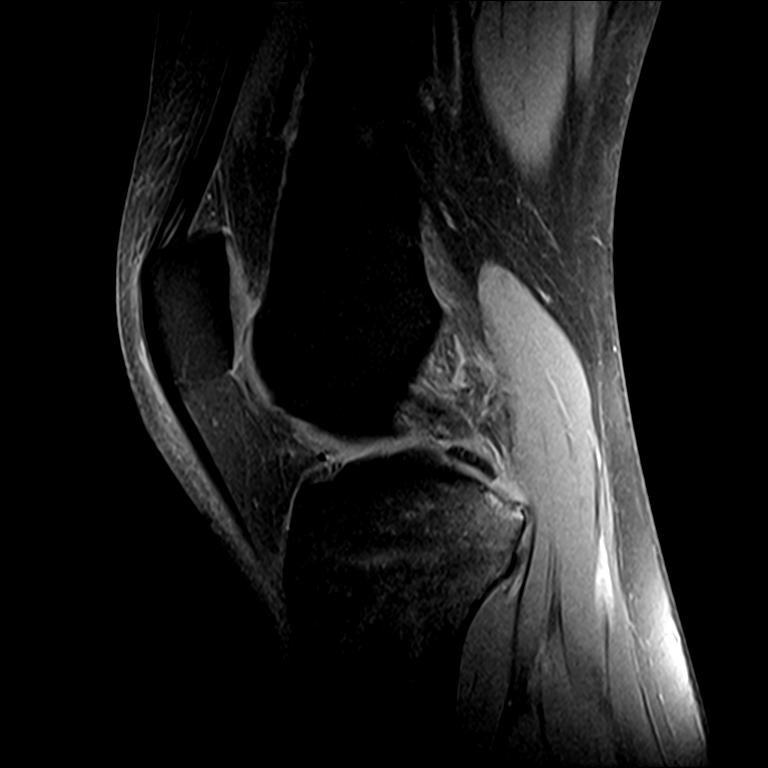
[im 12/23]
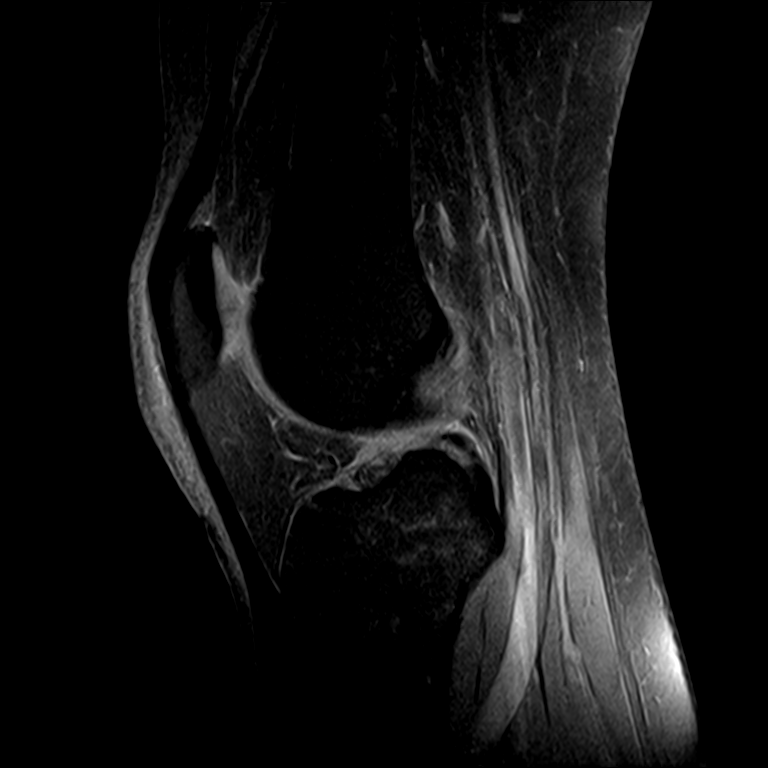
[im 14/23]
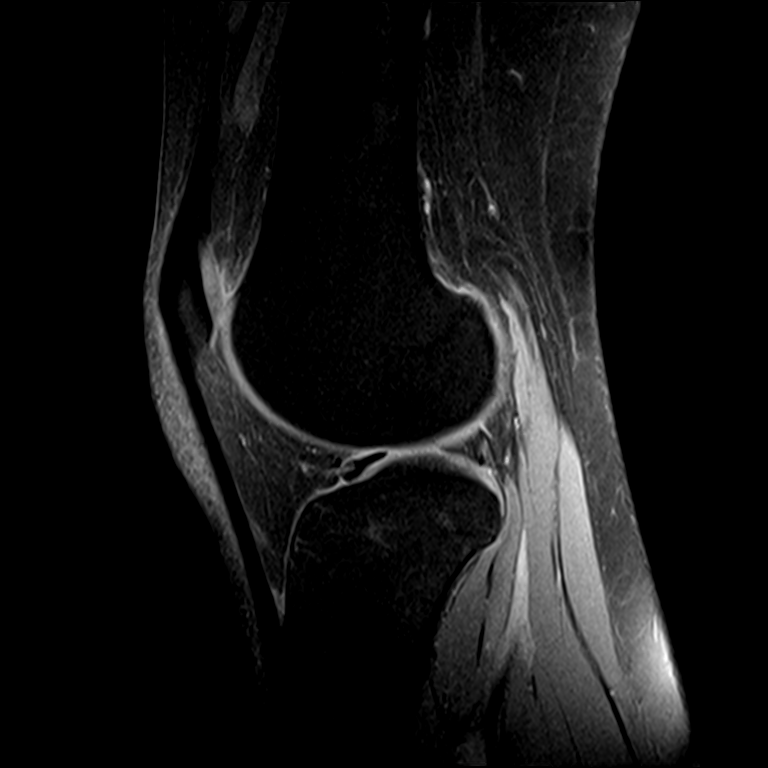
[im 20/23]
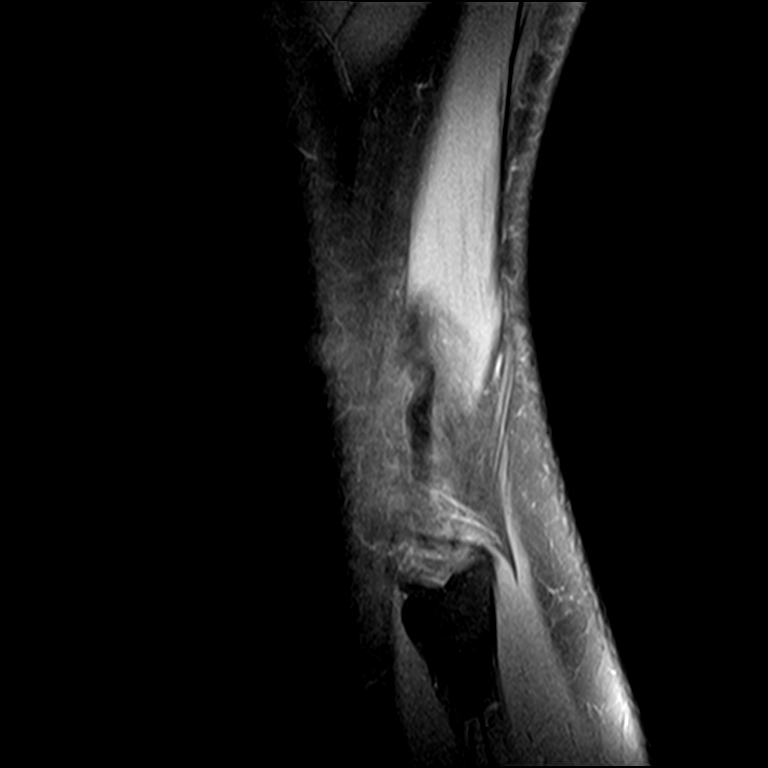

[Series 5: PD · coronal · 4.0mm · 0.53mm/px · 3 of 18 slices shown (2 of 2)]
[im 3/18]
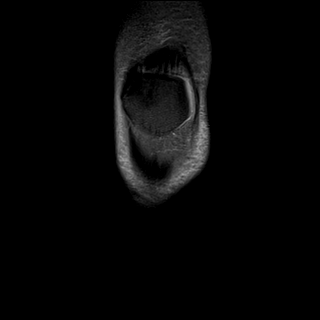
[im 9/18]
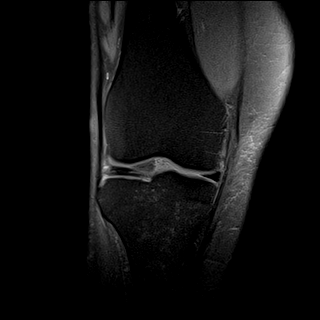
[im 15/18]
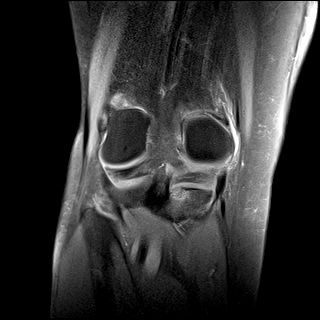

[Series 7: T1 · coronal · 4.0mm · 0.53mm/px · 3 of 18 slices shown]
[im 3/18]
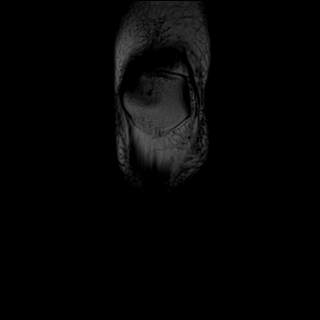
[im 9/18]
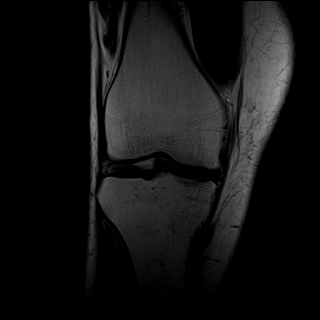
[im 15/18]
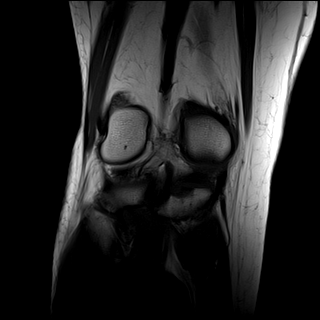

[16 of 40 positions shown; findings below may reference images not displayed]

FINDINGS: MENISCI

Medial meniscus:  Intact

Lateral meniscus:  Intact

LIGAMENTS

Cruciates:  Intact

Collaterals:  Intact.  Mild MCL and pes anserine bursitis.

CARTILAGE

Patellofemoral:  Normal

Medial:  Normal

Lateral:  Normal

Joint:  No joint effusion.

Popliteal Fossa:  No popliteal mass.  Tiny Baker's cyst.

Extensor Mechanism: The patella retinacular structures are intact
and the quadriceps and patellar tendons are intact.

Bones: There is a bone contusion involving the posterior aspect of
the medial tibial plateau with an impaction type injury posteriorly
and laterally. No femoral bone contusions.

Other: Normal knee musculature.
IMPRESSION: 1. Intact ligamentous structures and no meniscal tears.
2. Mild MCL and pes anserine bursitis.
3. Focal bone contusion involving the posterior and lateral aspect
of the medial tibial plateau.
4. No joint effusion.  Small Baker's cyst.

## 2019-03-07 ENCOUNTER — Other Ambulatory Visit (HOSPITAL_COMMUNITY): Payer: Self-pay | Admitting: Psychiatry

## 2019-03-07 DIAGNOSIS — F411 Generalized anxiety disorder: Secondary | ICD-10-CM

## 2019-03-07 DIAGNOSIS — F319 Bipolar disorder, unspecified: Secondary | ICD-10-CM

## 2019-03-13 ENCOUNTER — Ambulatory Visit (HOSPITAL_COMMUNITY): Payer: Medicaid Other | Admitting: Psychiatry

## 2019-03-13 ENCOUNTER — Other Ambulatory Visit: Payer: Self-pay

## 2019-04-17 ENCOUNTER — Encounter (HOSPITAL_COMMUNITY): Payer: Self-pay | Admitting: Psychiatry

## 2019-04-17 ENCOUNTER — Ambulatory Visit (INDEPENDENT_AMBULATORY_CARE_PROVIDER_SITE_OTHER): Payer: Medicaid Other | Admitting: Psychiatry

## 2019-04-17 ENCOUNTER — Other Ambulatory Visit: Payer: Self-pay

## 2019-04-17 DIAGNOSIS — F411 Generalized anxiety disorder: Secondary | ICD-10-CM | POA: Diagnosis not present

## 2019-04-17 DIAGNOSIS — F319 Bipolar disorder, unspecified: Secondary | ICD-10-CM | POA: Diagnosis not present

## 2019-04-17 MED ORDER — CLONAZEPAM 0.5 MG PO TABS: 0.5000 mg | ORAL_TABLET | Freq: Three times a day (TID) | ORAL | 2 refills | Status: DC | PRN

## 2019-04-17 MED ORDER — LAMOTRIGINE 100 MG PO TABS: 300.0000 mg | ORAL_TABLET | Freq: Every day | ORAL | 0 refills | Status: DC

## 2019-04-17 MED ORDER — DESVENLAFAXINE SUCCINATE ER 50 MG PO TB24: 50.0000 mg | ORAL_TABLET | Freq: Every day | ORAL | 0 refills | Status: DC

## 2019-04-17 NOTE — Progress Notes (Signed)
Virtual Visit via Telephone Note  I connected with Nancy Bowers on 04/17/19 at  1:15 PM EDT by telephone and verified that I am speaking with the correct person using two identifiers.  Location: Patient: car Provider: office   I discussed the limitations, risks, security and privacy concerns of performing an evaluation and management service by telephone and the availability of in person appointments. I also discussed with the patient that there may be a patient responsible charge related to this service. The patient expressed understanding and agreed to proceed.   History of Present Illness: Pt is in [place]. She is with her husband, son and dog. Her mood has improved. "I am doing a lot, a lot, a lot better". She is no longer wanting to "bang my head against the wall or hurt myself". She is not snapping at her husband as much. Her mom is a big trigger and pt moved out. Her stress is in storage. Pt plans to move back to [place] at some point. Nancy Bowers still has a lot of anxiety but the meds are helping. Pt states "depression is ok". She denies any manic or hypomanic like symptoms. She is eating more. She notes that she is constantly wanting to clean and attributes it to COVID and protecting herself and son. Her neck is improving. She is able to walk better and is getting stronger. Her ADHD comes on and off but expects that because she is not on any medication for it.    Observations/Objective: I spoke with Nancy Bowers on the phone.  Pt was calm, pleasant and cooperative.  Pt was engaged in the conversation and answered questions appropriately.  Speech was clear and coherent with normal rate, tone and volume.  Mood is anxious, affect is congruent-she is brighter and calmer than at previous visits. Thought processes are coherent circumstantial and tangential.  Thought content is with ruminations.  Pt denies SI/HI.   Pt denies auditory and visual hallucinations and did not appear to be responding to  internal stimuli.  Memory and concentration are good.  Fund of knowledge and use of language are average.  Insight and judgment are fair.  I am unable to comment on psychomotor activity, general appearance, hygiene, or eye contact as I was unable to physically see the patient on the phone.  Vital signs not available since interview conducted virtually.    Assessment and Plan: Bipolar I d/o; GAD; ADHD- inattentive type  Klonopin 0.5mg  po TID prn anxiety Pristiq 50mg  po qD-patient states this medication has been very beneficial Lamictal 300mg  po qD   Follow Up Instructions: 3 months or sooner if needed   I discussed the assessment and treatment plan with the patient. The patient was provided an opportunity to ask questions and all were answered. The patient agreed with the plan and demonstrated an understanding of the instructions.   The patient was advised to call back or seek an in-person evaluation if the symptoms worsen or if the condition fails to improve as anticipated.  I provided 25 minutes of non-face-to-face time during this encounter.   Charlcie Cradle, MD

## 2019-06-05 ENCOUNTER — Other Ambulatory Visit (HOSPITAL_COMMUNITY): Payer: Self-pay | Admitting: Psychiatry

## 2019-06-05 DIAGNOSIS — F319 Bipolar disorder, unspecified: Secondary | ICD-10-CM

## 2019-06-05 DIAGNOSIS — F411 Generalized anxiety disorder: Secondary | ICD-10-CM

## 2019-06-26 ENCOUNTER — Ambulatory Visit (INDEPENDENT_AMBULATORY_CARE_PROVIDER_SITE_OTHER): Payer: Medicaid Other | Admitting: Psychiatry

## 2019-06-26 DIAGNOSIS — Z5329 Procedure and treatment not carried out because of patient's decision for other reasons: Secondary | ICD-10-CM

## 2019-06-26 NOTE — Progress Notes (Signed)
Husband answered and stated pt was shopping. She would call the clinic back when she was done

## 2019-07-04 ENCOUNTER — Other Ambulatory Visit (HOSPITAL_COMMUNITY): Payer: Self-pay | Admitting: Psychiatry

## 2019-07-04 DIAGNOSIS — F411 Generalized anxiety disorder: Secondary | ICD-10-CM

## 2019-07-04 DIAGNOSIS — F319 Bipolar disorder, unspecified: Secondary | ICD-10-CM

## 2019-08-29 ENCOUNTER — Other Ambulatory Visit (HOSPITAL_COMMUNITY): Payer: Self-pay | Admitting: Psychiatry

## 2019-08-29 DIAGNOSIS — F411 Generalized anxiety disorder: Secondary | ICD-10-CM

## 2019-08-29 DIAGNOSIS — F319 Bipolar disorder, unspecified: Secondary | ICD-10-CM

## 2019-09-04 ENCOUNTER — Ambulatory Visit (INDEPENDENT_AMBULATORY_CARE_PROVIDER_SITE_OTHER): Payer: Medicaid Other | Admitting: Psychiatry

## 2019-09-04 ENCOUNTER — Other Ambulatory Visit (HOSPITAL_COMMUNITY): Payer: Self-pay | Admitting: Psychiatry

## 2019-09-04 ENCOUNTER — Telehealth (HOSPITAL_COMMUNITY): Payer: Self-pay | Admitting: *Deleted

## 2019-09-04 ENCOUNTER — Other Ambulatory Visit: Payer: Self-pay

## 2019-09-04 DIAGNOSIS — F319 Bipolar disorder, unspecified: Secondary | ICD-10-CM

## 2019-09-04 DIAGNOSIS — F411 Generalized anxiety disorder: Secondary | ICD-10-CM

## 2019-09-04 DIAGNOSIS — Z5329 Procedure and treatment not carried out because of patient's decision for other reasons: Secondary | ICD-10-CM

## 2019-09-04 MED ORDER — LAMOTRIGINE 100 MG PO TABS: 300.0000 mg | ORAL_TABLET | Freq: Every day | ORAL | 0 refills | Status: DC

## 2019-09-04 MED ORDER — DESVENLAFAXINE SUCCINATE ER 50 MG PO TB24: 50.0000 mg | ORAL_TABLET | Freq: Every day | ORAL | 0 refills | Status: DC

## 2019-09-04 NOTE — Telephone Encounter (Signed)
Pt called requesting refill on Pristiqu and Lamictal. Writer informed pt that she has a refill of her Klonopin and to check with pharmacy. Her appointment was rescheduled from today to 09/15/19. Please review and advise.

## 2019-09-04 NOTE — Progress Notes (Signed)
1 month refill for Pristiq and Lamictal

## 2019-09-04 NOTE — Progress Notes (Signed)
I called the patient on the numbers listed on the chart. There was no way to leave voice messages. I was unable to speak with the patient today.

## 2019-09-09 ENCOUNTER — Telehealth (HOSPITAL_COMMUNITY): Payer: Self-pay | Admitting: *Deleted

## 2019-09-09 NOTE — Telephone Encounter (Signed)
Patient called stating she needs a refill of Klonopin and that she was only given a 6 day supply on 12/18. Next appointment 10/16/19.

## 2019-09-10 ENCOUNTER — Telehealth (HOSPITAL_COMMUNITY): Payer: Self-pay | Admitting: *Deleted

## 2019-09-10 NOTE — Telephone Encounter (Signed)
Patient called left a voicemail asking if her Klonopin as been refilled yet.She is asking for a 1 month supply to get her to the next appointment.

## 2019-09-11 ENCOUNTER — Other Ambulatory Visit (HOSPITAL_COMMUNITY): Payer: Self-pay | Admitting: Psychiatry

## 2019-09-11 DIAGNOSIS — F411 Generalized anxiety disorder: Secondary | ICD-10-CM

## 2019-09-11 DIAGNOSIS — F319 Bipolar disorder, unspecified: Secondary | ICD-10-CM

## 2019-09-11 MED ORDER — CLONAZEPAM 0.5 MG PO TABS: 0.5000 mg | ORAL_TABLET | Freq: Three times a day (TID) | ORAL | 0 refills | Status: DC | PRN

## 2019-09-11 NOTE — Progress Notes (Signed)
Klonopin refill

## 2019-10-16 ENCOUNTER — Telehealth (HOSPITAL_COMMUNITY): Payer: Self-pay | Admitting: Psychiatry

## 2019-10-16 ENCOUNTER — Telehealth (HOSPITAL_COMMUNITY): Payer: Self-pay

## 2019-10-16 ENCOUNTER — Other Ambulatory Visit: Payer: Self-pay

## 2019-10-16 ENCOUNTER — Ambulatory Visit (HOSPITAL_COMMUNITY): Payer: Medicaid Other | Admitting: Psychiatry

## 2019-10-16 NOTE — Telephone Encounter (Signed)
Patient called regarding her medications. She missed her appointment this morning and rescheduled for 3/4. She stated that she doesn't have enough and needs a refill on her medications to be sent to [facility] on [address] in [place].  Thank you.

## 2019-10-16 NOTE — Telephone Encounter (Signed)
I called Lyla Son on the phone number listed in 2 times.  I left voice messages.  There was no answer and no return call.  I was unable to see the patient today.

## 2019-10-23 ENCOUNTER — Other Ambulatory Visit (HOSPITAL_COMMUNITY): Payer: Self-pay | Admitting: Psychiatry

## 2019-10-23 DIAGNOSIS — F411 Generalized anxiety disorder: Secondary | ICD-10-CM

## 2019-10-23 DIAGNOSIS — F319 Bipolar disorder, unspecified: Secondary | ICD-10-CM

## 2019-10-23 MED ORDER — CLONAZEPAM 0.5 MG PO TABS: 0.5000 mg | ORAL_TABLET | Freq: Three times a day (TID) | ORAL | 0 refills | Status: DC | PRN

## 2019-10-23 MED ORDER — LAMOTRIGINE 100 MG PO TABS: 300.0000 mg | ORAL_TABLET | Freq: Every day | ORAL | 0 refills | Status: DC

## 2019-10-23 MED ORDER — DESVENLAFAXINE SUCCINATE ER 50 MG PO TB24: 50.0000 mg | ORAL_TABLET | Freq: Every day | ORAL | 0 refills | Status: DC

## 2019-10-23 NOTE — Telephone Encounter (Signed)
done

## 2019-11-20 ENCOUNTER — Ambulatory Visit (INDEPENDENT_AMBULATORY_CARE_PROVIDER_SITE_OTHER): Payer: Medicaid Other | Admitting: Psychiatry

## 2019-11-20 ENCOUNTER — Encounter (HOSPITAL_COMMUNITY): Payer: Self-pay | Admitting: Psychiatry

## 2019-11-20 ENCOUNTER — Other Ambulatory Visit: Payer: Self-pay

## 2019-11-20 DIAGNOSIS — F900 Attention-deficit hyperactivity disorder, predominantly inattentive type: Secondary | ICD-10-CM

## 2019-11-20 DIAGNOSIS — F319 Bipolar disorder, unspecified: Secondary | ICD-10-CM

## 2019-11-20 DIAGNOSIS — F411 Generalized anxiety disorder: Secondary | ICD-10-CM | POA: Diagnosis not present

## 2019-11-20 MED ORDER — DESVENLAFAXINE SUCCINATE ER 100 MG PO TB24: 100.0000 mg | ORAL_TABLET | Freq: Every day | ORAL | 0 refills | Status: DC

## 2019-11-20 MED ORDER — LAMOTRIGINE 100 MG PO TABS: 300.0000 mg | ORAL_TABLET | Freq: Every day | ORAL | 0 refills | Status: DC

## 2019-11-20 MED ORDER — CLONAZEPAM 0.5 MG PO TABS: 0.5000 mg | ORAL_TABLET | Freq: Three times a day (TID) | ORAL | 1 refills | Status: DC | PRN

## 2019-11-20 NOTE — Progress Notes (Signed)
Virtual Visit via Telephone Note  I connected with Nancy Bowers on 11/20/19 at 10:30 AM EST by telephone and verified that I am speaking with the correct person using two identifiers.  Location: Patient: home Provider: office   I discussed the limitations, risks, security and privacy concerns of performing an evaluation and management service by telephone and the availability of in person appointments. I also discussed with the patient that there may be a patient responsible charge related to this service. The patient expressed understanding and agreed to proceed.   History of Present Illness: Nancy Bowers reports she is in a lot of pain today. She has a lot of back and neck pain. She has fallen several times and has difficulty walking. Nancy Bowers is working with her neurologist and pain doctor. Her anxiety is much worse. 4 times a week it is overwhelming. She is fighting with her husband a lot. Nancy Bowers that her husband is paying more attention to others than to her. She is very frustrated- they are not having sex, she doesn't feel he finds her attractive anymore and that he resents her need for care. Her concentration is poor and she wants to restart Adderall. She is easily upset and gets angry often. At time she "rages to the point I want to stab someone. Of course I would never do that". Her depression is worse. Her self esteem is low and she is crying a lot.  She denies SI/HI. Nancy Bowers admits that she has hold her husband that if he leaves her then she will kill herself. She is having "manic episodes". When frustrated she will bang her head- she doesn't care if she hurts herself and only wants to get away. She is sleeping too much and her energy is low. She spends a lot of time in bed and very low motivation. Other days she can't sit still and has to get stuff done.    Observations/Objective:  General Appearance: unable to assess  Eye Contact:  unable to assess  Speech:  Normal Rate and Slurred     Volume:  Normal  Mood:  Anxious and Depressed  Affect:  Depressed and irritable  Thought Process:  Coherent and Descriptions of Associations: Circumstantial  Orientation:  Full (Time, Place, and Person)  Thought Content:  Rumination  Suicidal Thoughts:  No  Homicidal Thoughts:  No  Memory:  Immediate;   Fair  Judgement:  Fair  Insight:  Shallow  Psychomotor Activity: unable to assess  Concentration:  Concentration: Poor  Recall:  Poor  Fund of Knowledge:  Fair  Language:  Fair  Akathisia:  unable to assess  Handed:  Right  AIMS (if indicated):     Assets:  Desire for [place] Talents/Skills Transportation  ADL's:  unable to assess  Cognition:  WNL  Sleep:        I reviewed the information below on 11/20/19 and have updated it Assessment and Plan:  Bipolar I d/o; GAD; ADHD- inattentive type   Klonopin 0.5mg  po TID prn anxiety  Increase Pristiq 100mg  po qD  Lamictal 300mg  po qD  Encouraged pt to start counseling- much of her depression is related to her physical health and low self esteem  Follow Up Instructions: In 2 months or sooner if needed   I discussed the assessment and treatment plan with the patient. The patient was provided an opportunity to ask questions and all were answered. The patient agreed with the plan and demonstrated an understanding of the instructions.  The patient was advised to call back or seek an in-person evaluation if the symptoms worsen or if the condition fails to improve as anticipated.  I provided 35 minutes of non-face-to-face time during this encounter.   Oletta Darter, MD

## 2019-11-24 ENCOUNTER — Telehealth (HOSPITAL_COMMUNITY): Payer: Self-pay | Admitting: Psychiatry

## 2019-11-25 ENCOUNTER — Other Ambulatory Visit: Payer: Self-pay

## 2019-11-25 ENCOUNTER — Ambulatory Visit (INDEPENDENT_AMBULATORY_CARE_PROVIDER_SITE_OTHER): Payer: Medicaid Other | Admitting: Licensed Clinical Social Worker

## 2019-11-25 DIAGNOSIS — F411 Generalized anxiety disorder: Secondary | ICD-10-CM

## 2019-11-25 DIAGNOSIS — F319 Bipolar disorder, unspecified: Secondary | ICD-10-CM | POA: Diagnosis not present

## 2019-11-25 DIAGNOSIS — F900 Attention-deficit hyperactivity disorder, predominantly inattentive type: Secondary | ICD-10-CM | POA: Diagnosis not present
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
## 2020-01-06 ENCOUNTER — Other Ambulatory Visit: Payer: Self-pay

## 2020-01-06 ENCOUNTER — Telehealth (HOSPITAL_COMMUNITY): Payer: Self-pay | Admitting: Licensed Clinical Social Worker

## 2020-01-06 ENCOUNTER — Ambulatory Visit (HOSPITAL_COMMUNITY): Payer: Medicaid Other | Admitting: Licensed Clinical Social Worker

## 2020-01-08 ENCOUNTER — Other Ambulatory Visit: Payer: Self-pay

## 2020-01-08 ENCOUNTER — Telehealth (INDEPENDENT_AMBULATORY_CARE_PROVIDER_SITE_OTHER): Payer: Medicaid Other | Admitting: Psychiatry

## 2020-01-08 ENCOUNTER — Encounter (HOSPITAL_COMMUNITY): Payer: Self-pay | Admitting: Psychiatry

## 2020-01-08 DIAGNOSIS — F319 Bipolar disorder, unspecified: Secondary | ICD-10-CM | POA: Diagnosis not present

## 2020-01-08 DIAGNOSIS — F411 Generalized anxiety disorder: Secondary | ICD-10-CM

## 2020-01-08 MED ORDER — CLONAZEPAM 0.5 MG PO TABS: 0.5000 mg | ORAL_TABLET | Freq: Three times a day (TID) | ORAL | 1 refills | Status: DC | PRN

## 2020-01-08 MED ORDER — AMITRIPTYLINE HCL 50 MG PO TABS: ORAL_TABLET | ORAL | 1 refills | Status: DC

## 2020-01-08 MED ORDER — LAMOTRIGINE 100 MG PO TABS: 300.0000 mg | ORAL_TABLET | Freq: Every day | ORAL | 1 refills | Status: DC

## 2020-01-08 NOTE — BH Specialist Note (Signed)
Virtual Visit via Telephone Note  I connected with Annice Pih on 01/08/20 at  1:30 PM EDT by telephone and verified that I am speaking with the correct person using two identifiers.  Location: Patient: home Provider: office   I discussed the limitations, risks, security and privacy concerns of performing an evaluation and management service by telephone and the availability of in person appointments. I also discussed with the patient that there may be a patient responsible charge related to this service. The patient expressed understanding and agreed to proceed.   History of Present Illness: Nitasha is very stressed and feeling very anxious because she is having significant pain. The pain is so bad that she is crying and can't walk. She went to see her neck doctor and is going to have an MRI. Karly is feeling very depressed. She is feeling sad, crying and feeling worthless. She denies SI/HI. Arlean wants to restart Adderall because her focus is poor. She thinks it is part of why she is depressed. Mammie does not think the Pristiq is working. She denies manic and hypomanic like symptoms.    Observations/Objective:  General Appearance: unable to assess  Eye Contact:  unable to assess  Speech:  Clear and Coherent and Normal Rate  Volume:  Normal  Mood:  Anxious and Depressed  Affect:  Congruent  Thought Process:  Linear and Descriptions of Associations: Circumstantial  Orientation:  Full (Time, Place, and Person)  Thought Content:  Rumination  Suicidal Thoughts:  No  Homicidal Thoughts:  No  Memory:  Immediate;   Fair  Judgement:  Fair  Insight:  Fair  Psychomotor Activity: unable to assess  Concentration:  Concentration: Poor  Recall:  Poor  Fund of Knowledge:  Fair  Language:  Fair  Akathisia:  unable to assess  Handed:  Right  AIMS (if indicated):     Assets:  Communication Skills Desire for Improvement Financial Resources/Insurance Intimacy Social Support Talents/Skills   ADL's:  unable to assess  Cognition:  WNL  Sleep:        I reviewed the information below on 01/08/2020 and have updated it Assessment and Plan:  Bipolar I d/o; GAD; ADHD- inattentive type   Klonopin 0.5mg  po TID prn anxiety   D/c Pristiq    Lamictal 300mg  po qD  Start trial of Amitriptyline 25mg  po qD x 6 days then increase to 50mg  qD   Encouraged pt to start counseling- much of her depression is related to her physical health and low self esteem    Follow Up Instructions: In 4 to 6 weeks or sooner if needed   I discussed the assessment and treatment plan with the patient. The patient was provided an opportunity to ask questions and all were answered. The patient agreed with the plan and demonstrated an understanding of the instructions.   The patient was advised to call back or seek an in-person evaluation if the symptoms worsen or if the condition fails to improve as anticipated.  I provided 25 minutes of non-face-to-face time during this encounter.   , MD

## 2020-02-09 ENCOUNTER — Other Ambulatory Visit (HOSPITAL_COMMUNITY): Payer: Self-pay | Admitting: Psychiatry

## 2020-02-09 DIAGNOSIS — F319 Bipolar disorder, unspecified: Secondary | ICD-10-CM

## 2020-02-09 DIAGNOSIS — F411 Generalized anxiety disorder: Secondary | ICD-10-CM

## 2020-02-12 ENCOUNTER — Telehealth (HOSPITAL_COMMUNITY): Payer: Medicaid Other | Admitting: Psychiatry

## 2020-02-12 ENCOUNTER — Telehealth (HOSPITAL_COMMUNITY): Payer: Self-pay | Admitting: Psychiatry

## 2020-02-12 ENCOUNTER — Other Ambulatory Visit: Payer: Self-pay

## 2020-02-12 NOTE — Telephone Encounter (Signed)
I called the patient for our scheduled appointment. There was no answer and I left a voice message asking pt to call the clinic when available. I was unable to speak with the patient today.

## 2020-02-26 ENCOUNTER — Other Ambulatory Visit: Payer: Self-pay

## 2020-02-26 ENCOUNTER — Telehealth (INDEPENDENT_AMBULATORY_CARE_PROVIDER_SITE_OTHER): Payer: Medicaid Other | Admitting: Psychiatry

## 2020-02-26 ENCOUNTER — Encounter (HOSPITAL_COMMUNITY): Payer: Self-pay | Admitting: Psychiatry

## 2020-02-26 DIAGNOSIS — F319 Bipolar disorder, unspecified: Secondary | ICD-10-CM

## 2020-02-26 DIAGNOSIS — F900 Attention-deficit hyperactivity disorder, predominantly inattentive type: Secondary | ICD-10-CM

## 2020-02-26 DIAGNOSIS — F411 Generalized anxiety disorder: Secondary | ICD-10-CM

## 2020-02-26 MED ORDER — CLONAZEPAM 0.5 MG PO TABS: 0.5000 mg | ORAL_TABLET | Freq: Three times a day (TID) | ORAL | 2 refills | Status: DC | PRN

## 2020-02-26 MED ORDER — AMPHETAMINE-DEXTROAMPHET ER 15 MG PO CP24: 15.0000 mg | ORAL_CAPSULE | Freq: Every day | ORAL | 0 refills | Status: DC

## 2020-02-26 MED ORDER — AMITRIPTYLINE HCL 100 MG PO TABS: 100.0000 mg | ORAL_TABLET | Freq: Every day | ORAL | 2 refills | Status: DC

## 2020-02-26 MED ORDER — LAMOTRIGINE 100 MG PO TABS: 300.0000 mg | ORAL_TABLET | Freq: Every day | ORAL | 2 refills | Status: DC

## 2020-02-26 NOTE — Progress Notes (Signed)
Virtual Visit via Telephone Note  I connected with Annice Pih on 02/26/20 at  2:30 PM EDT by telephone and verified that I am speaking with the correct person using two identifiers.  Location: Patient: home Provider: office   I discussed the limitations, risks, security and privacy concerns of performing an evaluation and management service by telephone and the availability of in person appointments. I also discussed with the patient that there may be a patient responsible charge related to this service. The patient expressed understanding and agreed to proceed.   History of Present Illness: Sanaz reports she has moved back to [place] and is living with her mom. She is having a lot of anxiety. She had a recent MRI and she will be having back surgery due to bulging disks. The pain is contributing to her anxiety. The pain and resulting anxiety cause her to be irritable and hateful towards others. She is restless and is not resting like she should be. The Klonopin takes a while to help and lately she has had to take 1mg  to calm her anxiety. Shaylie is scared of the surgery. She is looking for her own place. She has an upcoming appointment with the pain clinic. Znya states she needs to get back on ADHD meds because she is not functioning well. Laneisha is overwhelmed and stressed. She is depressed and some moments are worse than others. She denies SI/HI. Overall her depression is manageable. The Amitriptyline is helping. She is not sleeping much due to pain, sleeping in the car and taking care of the dogs. Gracynn denies manic and hypomanic like symptoms.    Observations/Objective:  General Appearance: unable to assess  Eye Contact:  unable to assess  Speech:  Clear and Coherent and Normal Rate  Volume:  Normal  Mood:  Anxious and Depressed  Affect:  Congruent  Thought Process:  Coherent and Descriptions of Associations: Circumstantial  Orientation:  Full (Time, Place, and Person)  Thought Content:   Rumination  Suicidal Thoughts:  No  Homicidal Thoughts:  No  Memory:  Immediate;   Fair  Judgement:  Fair  Insight:  Good  Psychomotor Activity: unable to assess  Concentration:  Concentration: Fair  Recall:  Iona Hansen of Knowledge:  Fair  Language:  Fair  Akathisia:  unable to assess  Handed:  Right  AIMS (if indicated):     Assets:  Communication Skills Desire for Improvement Financial Resources/Insurance Housing Intimacy Social Support Talents/Skills Transportation  ADL's:  unable to assess  Cognition:  WNL  Sleep:         Assessment and Plan: Bipolar I d/o- current episode depressed; GAD; ADHD- inattentive type  Klonopin 0.5mg  po TID prn anxiety- declined request to increase dose to 1mg .  Increase Amitriptyline 100mg  po qHS  Lamictal 300mg  po qD  Start Adderall XR 15mg  po qD for ADHD   Follow Up Instructions: In 2 mo or sooner if needed   I discussed the assessment and treatment plan with the patient. The patient was provided an opportunity to ask questions and all were answered. The patient agreed with the plan and demonstrated an understanding of the instructions.   The patient was advised to call back or seek an in-person evaluation if the symptoms worsen or if the condition fails to improve as anticipated.  I provided 30  minutes of non-face-to-face time during this encounter.   [employer], MD

## 2020-02-27 ENCOUNTER — Other Ambulatory Visit: Payer: Self-pay | Admitting: Neurosurgery

## 2020-02-27 ENCOUNTER — Emergency Department (HOSPITAL_COMMUNITY)
Admission: EM | Admit: 2020-02-27 | Discharge: 2020-02-28 | Disposition: A | Discharge status: Home / Self Care | Payer: Medicaid Other | Attending: Emergency Medicine | Admitting: Emergency Medicine

## 2020-02-27 ENCOUNTER — Telehealth (HOSPITAL_COMMUNITY): Payer: Self-pay

## 2020-02-27 ENCOUNTER — Encounter (HOSPITAL_COMMUNITY): Payer: Self-pay | Admitting: Emergency Medicine

## 2020-02-27 ENCOUNTER — Other Ambulatory Visit: Payer: Self-pay

## 2020-02-27 DIAGNOSIS — F17210 Nicotine dependence, cigarettes, uncomplicated: Secondary | ICD-10-CM | POA: Diagnosis not present

## 2020-02-27 DIAGNOSIS — M545 Low back pain: Secondary | ICD-10-CM | POA: Diagnosis not present

## 2020-02-27 DIAGNOSIS — G8929 Other chronic pain: Secondary | ICD-10-CM | POA: Diagnosis not present

## 2020-02-27 DIAGNOSIS — Z79899 Other long term (current) drug therapy: Secondary | ICD-10-CM | POA: Insufficient documentation

## 2020-02-27 DIAGNOSIS — J45909 Unspecified asthma, uncomplicated: Secondary | ICD-10-CM | POA: Diagnosis not present

## 2020-02-27 LAB — BASIC METABOLIC PANEL
Anion gap: 8 (ref 5–15)
BUN: 18 mg/dL (ref 6–20)
CO2: 21 mmol/L — ABNORMAL LOW (ref 22–32)
Calcium: 8.2 mg/dL — ABNORMAL LOW (ref 8.9–10.3)
Chloride: 110 mmol/L (ref 98–111)
Creatinine, Ser: 0.85 mg/dL (ref 0.44–1.00)
GFR calc Af Amer: >60 mL/min (ref >60)
GFR calc non Af Amer: >60 mL/min (ref >60)
Glucose, Bld: 102 mg/dL — ABNORMAL HIGH (ref 70–99)
Potassium: 3.8 mmol/L (ref 3.5–5.1)
Sodium: 139 mmol/L (ref 135–145)

## 2020-02-27 LAB — URINALYSIS, ROUTINE W REFLEX MICROSCOPIC
Bacteria, UA: NONE SEEN
Bilirubin Urine: NEGATIVE
Glucose, UA: NEGATIVE mg/dL
Ketones, ur: NEGATIVE mg/dL
Leukocytes,Ua: NEGATIVE
Nitrite: NEGATIVE
Protein, ur: 30 mg/dL — AB
Specific Gravity, Urine: 1.029 (ref 1.005–1.030)
pH: 5 (ref 5.0–8.0)

## 2020-02-27 LAB — CBC WITH DIFFERENTIAL/PLATELET
Abs Immature Granulocytes: 0.03 10*3/uL (ref 0.00–0.07)
Basophils Absolute: 0.1 10*3/uL (ref 0.0–0.1)
Basophils Relative: 1 %
Eosinophils Absolute: 0.4 10*3/uL (ref 0.0–0.5)
Eosinophils Relative: 5 %
HCT: 37.8 % (ref 36.0–46.0)
Hemoglobin: 12 g/dL (ref 12.0–15.0)
Immature Granulocytes: 0 %
Lymphocytes Relative: 34 %
Lymphs Abs: 2.8 10*3/uL (ref 0.7–4.0)
MCH: 29.9 pg (ref 26.0–34.0)
MCHC: 31.7 g/dL (ref 30.0–36.0)
MCV: 94.3 fL (ref 80.0–100.0)
Monocytes Absolute: 0.4 10*3/uL (ref 0.1–1.0)
Monocytes Relative: 5 %
Neutro Abs: 4.6 10*3/uL (ref 1.7–7.7)
Neutrophils Relative %: 55 %
Platelets: 398 10*3/uL (ref 150–400)
RBC: 4.01 MIL/uL (ref 3.87–5.11)
RDW: 11.9 % (ref 11.5–15.5)
WBC: 8.3 10*3/uL (ref 4.0–10.5)
nRBC: 0 % (ref 0.0–0.2)

## 2020-02-27 LAB — I-STAT BETA HCG BLOOD, ED (MC, WL, AP ONLY): I-stat hCG, quantitative: <5 m[IU]/mL (ref <5)

## 2020-02-27 NOTE — ED Triage Notes (Signed)
Patient reports left low back pain radiating to left leg onset this week , patient stated that she fell this week , ambulatory /denies hematuria or dysuria .

## 2020-02-27 NOTE — Telephone Encounter (Signed)
error 

## 2020-02-28 MED ORDER — METHOCARBAMOL 500 MG PO TABS
500.0000 mg | ORAL_TABLET | Freq: Three times a day (TID) | ORAL | 0 refills | Status: DC | PRN
Start: 2020-02-28 — End: 2020-03-11

## 2020-02-28 MED ORDER — IBUPROFEN 800 MG PO TABS
800.0000 mg | ORAL_TABLET | Freq: Three times a day (TID) | ORAL | 0 refills | Status: DC | PRN
Start: 2020-02-28 — End: 2020-03-11

## 2020-02-28 MED ORDER — METHOCARBAMOL 500 MG PO TABS
500.0000 mg | ORAL_TABLET | Freq: Once | ORAL | Status: AC
  Administered 2020-02-28: 500 mg via ORAL
  Filled 2020-02-28: qty 1

## 2020-02-28 MED ORDER — IBUPROFEN 800 MG PO TABS
800.0000 mg | ORAL_TABLET | Freq: Once | ORAL | Status: AC
  Administered 2020-02-28: 800 mg via ORAL
  Filled 2020-02-28: qty 1

## 2020-02-28 MED ORDER — LIDOCAINE 5 % EX PTCH: 1.0000 | MEDICATED_PATCH | CUTANEOUS | 0 refills | Status: DC

## 2020-02-28 NOTE — Discharge Instructions (Addendum)
You were seen in the emergency department for chronic back pain.  I recommend following up with your primary care physician.  We have also seen that she receives regular pain medication prescriptions, last filled on May 11.  I am unable to provide you with any further narcotics in the emergency department and you will need to follow-up with your pain management specialist for further treatment.
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
## 2020-03-04 NOTE — Progress Notes (Signed)
[place], [place] - [place] [address] Phone: [phone] Fax: [phone]    Your procedure is scheduled on Tuesday, June 22nd.  Report to [facility] Main Entrance "A" at 8:10 A.M., and check in at the Admitting office.  Call this number if you have problems the morning of surgery:  [phone]  Call [phone] if you have any questions prior to your surgery date Monday-Friday 8am-4pm   Remember:  Do not eat or drink after midnight the night before your surgery   Take these medicines the morning of surgery with A SIP OF WATER  cetirizine (ZYRTEC)  clonazePAM (KLONOPIN) desvenlafaxine (PRISTIQ)  gabapentin (NEURONTIN) lamoTRIgine (LAMICTAL)  If needed - methocarbamol (ROBAXIN), oxyCODONE ER (XTAMPZA ER)   albuterol (PROVENTIL HFA;VENTOLIN HFA) 108 (90 Base)/ inhaler - bring with you the morning of surgery  As of today, STOP taking any Aspirin (unless otherwise instructed by your surgeon) and Aspirin containing products, Aleve, Naproxen, Ibuprofen, Motrin, Advil, Goody's, BC's, all herbal medications, fish oil, and all vitamins.             Do not wear jewelry, make up, or nail polish            Do not wear lotions, powders, perfumes, or deodorant.            Do not shave 48 hours prior to surgery.              Do not bring valuables to the hospital.            [facility] is not responsible for any belongings or valuables.  Do NOT Smoke (Tobacco/Vapping) or drink Alcohol 24 hours prior to your procedure If you use a CPAP at night, you may bring all equipment for your overnight stay.   Contacts, glasses, dentures or bridgework may not be worn into surgery.      For patients admitted to the hospital, discharge time will be determined by your treatment team.   Patients discharged the day of surgery will not be allowed to drive home, and someone needs to stay with them for 24 hours.  Special  instructions:   [facility]- Preparing For Surgery  Before surgery, you can play an important role. Because skin is not sterile, your skin needs to be as free of germs as possible. You can reduce the number of germs on your skin by washing with CHG (chlorahexidine gluconate) Soap before surgery.  CHG is an antiseptic cleaner which kills germs and bonds with the skin to continue killing germs even after washing.    Oral Hygiene is also important to reduce your risk of infection.  Remember - BRUSH YOUR TEETH THE MORNING OF SURGERY WITH YOUR REGULAR TOOTHPASTE  Please do not use if you have an allergy to CHG or antibacterial soaps. If your skin becomes reddened/irritated stop using the CHG.  Do not shave (including legs and underarms) for at least 48 hours prior to first CHG shower. It is OK to shave your face.  Please follow these instructions carefully.   1. Shower the NIGHT BEFORE SURGERY and the MORNING OF SURGERY with CHG Soap.   2. If you chose to wash your hair, wash your hair first as usual with your normal shampoo.  3. After you shampoo, rinse your hair and body thoroughly to remove the shampoo.  4. Use CHG as you would any other liquid soap. You can apply CHG directly to the  skin and wash gently with a scrungie or a clean washcloth.   5. Apply the CHG Soap to your body ONLY FROM THE NECK DOWN.  Do not use on open wounds or open sores. Avoid contact with your eyes, ears, mouth and genitals (private parts). Wash Face and genitals (private parts)  with your normal soap.   6. Wash thoroughly, paying special attention to the area where your surgery will be performed.  7. Thoroughly rinse your body with warm water from the neck down.  8. DO NOT shower/wash with your normal soap after using and rinsing off the CHG Soap.  9. Pat yourself dry with a CLEAN TOWEL.  10. Wear CLEAN PAJAMAS to bed the night before surgery, wear comfortable clothes the morning of surgery  11. Place CLEAN  SHEETS on your bed the night of your first shower and DO NOT SLEEP WITH PETS.  Day of Surgery: Shower with CHG soap as instructed above.  Do not apply any deodorants/lotions.  Please wear clean clothes to the hospital/surgery center.   Remember to brush your teeth WITH YOUR REGULAR TOOTHPASTE.   Please read over the following fact sheets that you were given.

## 2020-03-05 ENCOUNTER — Other Ambulatory Visit (HOSPITAL_COMMUNITY)
Admission: RE | Admit: 2020-03-05 | Discharge: 2020-03-05 | Disposition: A | Discharge status: Home / Self Care | Payer: Medicaid Other | Source: Ambulatory Visit | Attending: Neurosurgery | Admitting: Neurosurgery

## 2020-03-05 ENCOUNTER — Encounter (HOSPITAL_COMMUNITY): Payer: Self-pay | Admitting: Neurosurgery

## 2020-03-05 ENCOUNTER — Inpatient Hospital Stay (HOSPITAL_COMMUNITY)
Admission: RE | Admit: 2020-03-05 | Discharge: 2020-03-05 | Disposition: A | Discharge status: Home / Self Care | Payer: Self-pay | Source: Ambulatory Visit

## 2020-03-05 DIAGNOSIS — Z01812 Encounter for preprocedural laboratory examination: Secondary | ICD-10-CM | POA: Diagnosis not present

## 2020-03-05 DIAGNOSIS — Z20822 Contact with and (suspected) exposure to covid-19: Secondary | ICD-10-CM | POA: Diagnosis not present

## 2020-03-05 NOTE — Progress Notes (Signed)
Pre-op instructions given to pt. Npo at midnight,except medications I reviwed  with her. Pt. Tested today for COVID, instructed to quarantine.

## 2020-03-06 LAB — SARS CORONAVIRUS 2 (TAT 6-24 HRS): SARS Coronavirus 2: NEGATIVE

## 2020-03-09 ENCOUNTER — Encounter (HOSPITAL_COMMUNITY)
Admission: RE | Disposition: A | Discharge status: Home / Self Care | Payer: Self-pay | Source: Home / Self Care | Attending: Neurosurgery

## 2020-03-09 ENCOUNTER — Inpatient Hospital Stay (HOSPITAL_COMMUNITY)
Admission: RE | Admit: 2020-03-09 | Discharge: 2020-03-11 | DRG: 454 | Disposition: A | Discharge status: Home / Self Care | Payer: Medicaid Other | Attending: Neurosurgery | Admitting: Neurosurgery

## 2020-03-09 ENCOUNTER — Encounter (HOSPITAL_COMMUNITY): Payer: Self-pay | Admitting: Neurosurgery

## 2020-03-09 ENCOUNTER — Inpatient Hospital Stay (HOSPITAL_COMMUNITY): Payer: Medicaid Other | Admitting: Certified Registered"

## 2020-03-09 ENCOUNTER — Other Ambulatory Visit: Payer: Self-pay

## 2020-03-09 ENCOUNTER — Inpatient Hospital Stay (HOSPITAL_COMMUNITY): Payer: Medicaid Other

## 2020-03-09 DIAGNOSIS — M4310 Spondylolisthesis, site unspecified: Secondary | ICD-10-CM | POA: Diagnosis present

## 2020-03-09 DIAGNOSIS — Z79891 Long term (current) use of opiate analgesic: Secondary | ICD-10-CM

## 2020-03-09 DIAGNOSIS — G8929 Other chronic pain: Secondary | ICD-10-CM | POA: Diagnosis present

## 2020-03-09 DIAGNOSIS — Z818 Family history of other mental and behavioral disorders: Secondary | ICD-10-CM | POA: Diagnosis not present

## 2020-03-09 DIAGNOSIS — M48061 Spinal stenosis, lumbar region without neurogenic claudication: Secondary | ICD-10-CM | POA: Diagnosis present

## 2020-03-09 DIAGNOSIS — J449 Chronic obstructive pulmonary disease, unspecified: Secondary | ICD-10-CM | POA: Diagnosis present

## 2020-03-09 DIAGNOSIS — M4316 Spondylolisthesis, lumbar region: Secondary | ICD-10-CM | POA: Diagnosis present

## 2020-03-09 DIAGNOSIS — F429 Obsessive-compulsive disorder, unspecified: Secondary | ICD-10-CM | POA: Diagnosis present

## 2020-03-09 DIAGNOSIS — M4317 Spondylolisthesis, lumbosacral region: Secondary | ICD-10-CM | POA: Diagnosis present

## 2020-03-09 DIAGNOSIS — Z79899 Other long term (current) drug therapy: Secondary | ICD-10-CM

## 2020-03-09 DIAGNOSIS — Z419 Encounter for procedure for purposes other than remedying health state, unspecified: Secondary | ICD-10-CM

## 2020-03-09 DIAGNOSIS — Z791 Long term (current) use of non-steroidal anti-inflammatories (NSAID): Secondary | ICD-10-CM

## 2020-03-09 DIAGNOSIS — F17210 Nicotine dependence, cigarettes, uncomplicated: Secondary | ICD-10-CM | POA: Diagnosis present

## 2020-03-09 DIAGNOSIS — F3130 Bipolar disorder, current episode depressed, mild or moderate severity, unspecified: Secondary | ICD-10-CM | POA: Diagnosis present

## 2020-03-09 LAB — TYPE AND SCREEN
ABO/RH(D): O NEG
Antibody Screen: NEGATIVE

## 2020-03-09 LAB — HEPATIC FUNCTION PANEL
ALT: 27 U/L (ref 0–44)
AST: 56 U/L — ABNORMAL HIGH (ref 15–41)
Albumin: 4.1 g/dL (ref 3.5–5.0)
Alkaline Phosphatase: 75 U/L (ref 38–126)
Bilirubin, Direct: 0.1 mg/dL (ref 0.0–0.2)
Indirect Bilirubin: 0.7 mg/dL (ref 0.3–0.9)
Total Bilirubin: 0.8 mg/dL (ref 0.3–1.2)
Total Protein: 6.8 g/dL (ref 6.5–8.1)

## 2020-03-09 LAB — SURGICAL PCR SCREEN
MRSA, PCR: NEGATIVE
Staphylococcus aureus: NEGATIVE

## 2020-03-09 LAB — POCT PREGNANCY, URINE: Preg Test, Ur: NEGATIVE

## 2020-03-09 SURGERY — POSTERIOR LUMBAR FUSION 2 LEVEL
Anesthesia: General | Site: Spine Lumbar

## 2020-03-09 MED ORDER — BUPIVACAINE HCL (PF) 0.25 % IJ SOLN
INTRAMUSCULAR | Status: AC
  Filled 2020-03-09: qty 30

## 2020-03-09 MED ORDER — CEFAZOLIN SODIUM-DEXTROSE 2-4 GM/100ML-% IV SOLN
2.0000 g | INTRAVENOUS | Status: DC
  Filled 2020-03-09: qty 100

## 2020-03-09 MED ORDER — SODIUM CHLORIDE 0.9 % IV SOLN
INTRAVENOUS | Status: DC | PRN
Start: 2020-03-09 — End: 2020-03-09

## 2020-03-09 MED ORDER — SODIUM CHLORIDE 0.9% FLUSH: 3.0000 mL | Freq: Two times a day (BID) | INTRAVENOUS | Status: DC

## 2020-03-09 MED ORDER — THROMBIN 20000 UNITS EX SOLR
CUTANEOUS | Status: AC
  Filled 2020-03-09: qty 20000

## 2020-03-09 MED ORDER — 0.9 % SODIUM CHLORIDE (POUR BTL) OPTIME
TOPICAL | Status: DC | PRN
  Administered 2020-03-09: 1000 mL

## 2020-03-09 MED ORDER — ORAL CARE MOUTH RINSE: 15.0000 mL | Freq: Once | OROMUCOSAL | Status: AC

## 2020-03-09 MED ORDER — FENTANYL CITRATE (PF) 100 MCG/2ML IJ SOLN
INTRAMUSCULAR | Status: DC | PRN
  Administered 2020-03-09 (×7): 50 ug via INTRAVENOUS

## 2020-03-09 MED ORDER — ACETAMINOPHEN 325 MG PO TABS
650.0000 mg | ORAL_TABLET | ORAL | Status: DC | PRN
  Administered 2020-03-11: 650 mg via ORAL
  Filled 2020-03-09 (×2): qty 2

## 2020-03-09 MED ORDER — SUCCINYLCHOLINE CHLORIDE 200 MG/10ML IV SOSY
PREFILLED_SYRINGE | INTRAVENOUS | Status: AC
  Filled 2020-03-09: qty 10

## 2020-03-09 MED ORDER — LIDOCAINE 2% (20 MG/ML) 5 ML SYRINGE
INTRAMUSCULAR | Status: AC
  Filled 2020-03-09: qty 5

## 2020-03-09 MED ORDER — VANCOMYCIN HCL 1000 MG IV SOLR
INTRAVENOUS | Status: AC
  Filled 2020-03-09: qty 1000

## 2020-03-09 MED ORDER — ALBUTEROL SULFATE (2.5 MG/3ML) 0.083% IN NEBU: 2.5000 mg | INHALATION_SOLUTION | RESPIRATORY_TRACT | Status: DC | PRN

## 2020-03-09 MED ORDER — POLYETHYLENE GLYCOL 3350 17 G PO PACK: 17.0000 g | PACK | Freq: Every day | ORAL | Status: DC | PRN

## 2020-03-09 MED ORDER — VENLAFAXINE HCL ER 150 MG PO CP24
150.0000 mg | ORAL_CAPSULE | Freq: Every day | ORAL | Status: DC
  Administered 2020-03-10 – 2020-03-11 (×2): 150 mg via ORAL
  Filled 2020-03-09: qty 1
  Filled 2020-03-09: qty 2
  Filled 2020-03-09: qty 1

## 2020-03-09 MED ORDER — FENTANYL CITRATE (PF) 250 MCG/5ML IJ SOLN
INTRAMUSCULAR | Status: AC
  Filled 2020-03-09: qty 5

## 2020-03-09 MED ORDER — PHENOL 1.4 % MT LIQD: 1.0000 | OROMUCOSAL | Status: DC | PRN

## 2020-03-09 MED ORDER — PROPOFOL 10 MG/ML IV BOLUS
INTRAVENOUS | Status: DC | PRN
  Administered 2020-03-09: 20 mg via INTRAVENOUS
  Administered 2020-03-09: 140 mg via INTRAVENOUS

## 2020-03-09 MED ORDER — MIDAZOLAM HCL 5 MG/5ML IJ SOLN
INTRAMUSCULAR | Status: DC | PRN
  Administered 2020-03-09: 2 mg via INTRAVENOUS

## 2020-03-09 MED ORDER — CHLORHEXIDINE GLUCONATE CLOTH 2 % EX PADS: 6.0000 | MEDICATED_PAD | Freq: Once | CUTANEOUS | Status: DC

## 2020-03-09 MED ORDER — OXYCODONE HCL 5 MG PO TABS: 5.0000 mg | ORAL_TABLET | Freq: Once | ORAL | Status: DC | PRN

## 2020-03-09 MED ORDER — ALBUMIN HUMAN 5 % IV SOLN: INTRAVENOUS | Status: DC | PRN

## 2020-03-09 MED ORDER — SODIUM CHLORIDE 0.9 % IV SOLN
INTRAVENOUS | Status: DC | PRN
  Administered 2020-03-09: 500 mL

## 2020-03-09 MED ORDER — ACETAMINOPHEN 160 MG/5ML PO SOLN: 1000.0000 mg | Freq: Once | ORAL | Status: DC | PRN

## 2020-03-09 MED ORDER — PHENYLEPHRINE HCL-NACL 10-0.9 MG/250ML-% IV SOLN
INTRAVENOUS | Status: DC | PRN
Start: 2020-03-09 — End: 2020-03-09
  Administered 2020-03-09: 25 ug/min via INTRAVENOUS

## 2020-03-09 MED ORDER — HYDROCODONE-ACETAMINOPHEN 10-325 MG PO TABS: 1.0000 | ORAL_TABLET | ORAL | Status: DC | PRN

## 2020-03-09 MED ORDER — SODIUM CHLORIDE 0.9% FLUSH: 3.0000 mL | INTRAVENOUS | Status: DC | PRN

## 2020-03-09 MED ORDER — MIDAZOLAM HCL 2 MG/2ML IJ SOLN
INTRAMUSCULAR | Status: AC
  Filled 2020-03-09: qty 2

## 2020-03-09 MED ORDER — HYDROMORPHONE HCL 1 MG/ML IJ SOLN: 1.0000 mg | INTRAMUSCULAR | Status: DC | PRN

## 2020-03-09 MED ORDER — AMITRIPTYLINE HCL 50 MG PO TABS
100.0000 mg | ORAL_TABLET | Freq: Every day | ORAL | Status: DC
  Administered 2020-03-09: 100 mg via ORAL
  Filled 2020-03-09: qty 2
  Filled 2020-03-09 (×2): qty 1
  Filled 2020-03-09: qty 2

## 2020-03-09 MED ORDER — OXYCODONE HCL ER 15 MG PO T12A
15.0000 mg | EXTENDED_RELEASE_TABLET | Freq: Two times a day (BID) | ORAL | Status: DC
  Administered 2020-03-09: 15 mg via ORAL
  Filled 2020-03-09 (×2): qty 1

## 2020-03-09 MED ORDER — CEFAZOLIN SODIUM-DEXTROSE 1-4 GM/50ML-% IV SOLN
1.0000 g | Freq: Three times a day (TID) | INTRAVENOUS | Status: AC
  Administered 2020-03-09 – 2020-03-10 (×2): 1 g via INTRAVENOUS
  Filled 2020-03-09 (×2): qty 50

## 2020-03-09 MED ORDER — FLEET ENEMA 7-19 GM/118ML RE ENEM: 1.0000 | ENEMA | Freq: Once | RECTAL | Status: DC | PRN

## 2020-03-09 MED ORDER — ROCURONIUM BROMIDE 10 MG/ML (PF) SYRINGE
PREFILLED_SYRINGE | INTRAVENOUS | Status: DC | PRN
  Administered 2020-03-09: 70 mg via INTRAVENOUS
  Administered 2020-03-09: 20 mg via INTRAVENOUS
  Administered 2020-03-09: 30 mg via INTRAVENOUS
  Administered 2020-03-09: 20 mg via INTRAVENOUS

## 2020-03-09 MED ORDER — ACETAMINOPHEN 10 MG/ML IV SOLN: 1000.0000 mg | Freq: Once | INTRAVENOUS | Status: DC | PRN

## 2020-03-09 MED ORDER — OXYCODONE ER 13.5 MG PO C12A: 13.5000 mg | EXTENDED_RELEASE_CAPSULE | Freq: Two times a day (BID) | ORAL | Status: DC

## 2020-03-09 MED ORDER — OXYCODONE HCL 5 MG/5ML PO SOLN: 5.0000 mg | Freq: Once | ORAL | Status: DC | PRN

## 2020-03-09 MED ORDER — OXYCODONE HCL 5 MG PO TABS
10.0000 mg | ORAL_TABLET | ORAL | Status: DC | PRN
  Administered 2020-03-09 – 2020-03-11 (×6): 10 mg via ORAL
  Filled 2020-03-09 (×6): qty 2

## 2020-03-09 MED ORDER — ONDANSETRON HCL 4 MG/2ML IJ SOLN
INTRAMUSCULAR | Status: AC
  Filled 2020-03-09: qty 2

## 2020-03-09 MED ORDER — CLONAZEPAM 0.5 MG PO TABS: 0.5000 mg | ORAL_TABLET | Freq: Three times a day (TID) | ORAL | Status: DC | PRN

## 2020-03-09 MED ORDER — GABAPENTIN 400 MG PO CAPS
400.0000 mg | ORAL_CAPSULE | Freq: Three times a day (TID) | ORAL | Status: DC
  Administered 2020-03-09 – 2020-03-11 (×6): 400 mg via ORAL
  Filled 2020-03-09 (×7): qty 1

## 2020-03-09 MED ORDER — SODIUM CHLORIDE 0.9 % IV SOLN: 250.0000 mL | INTRAVENOUS | Status: DC

## 2020-03-09 MED ORDER — PROPOFOL 10 MG/ML IV BOLUS
INTRAVENOUS | Status: AC
  Filled 2020-03-09: qty 20

## 2020-03-09 MED ORDER — THROMBIN 20000 UNITS EX SOLR: CUTANEOUS | Status: DC | PRN

## 2020-03-09 MED ORDER — MENTHOL 3 MG MT LOZG: 1.0000 | LOZENGE | OROMUCOSAL | Status: DC | PRN

## 2020-03-09 MED ORDER — ONDANSETRON HCL 4 MG/2ML IJ SOLN: 4.0000 mg | Freq: Four times a day (QID) | INTRAMUSCULAR | Status: DC | PRN

## 2020-03-09 MED ORDER — ROCURONIUM BROMIDE 10 MG/ML (PF) SYRINGE
PREFILLED_SYRINGE | INTRAVENOUS | Status: AC
  Filled 2020-03-09: qty 10

## 2020-03-09 MED ORDER — HYDROMORPHONE HCL 1 MG/ML IJ SOLN
0.2500 mg | INTRAMUSCULAR | Status: DC | PRN
  Administered 2020-03-09 (×2): 0.5 mg via INTRAVENOUS

## 2020-03-09 MED ORDER — AMPHETAMINE-DEXTROAMPHET ER 5 MG PO CP24
15.0000 mg | ORAL_CAPSULE | Freq: Every day | ORAL | Status: DC
  Filled 2020-03-09 (×2): qty 3

## 2020-03-09 MED ORDER — CEFAZOLIN SODIUM-DEXTROSE 2-3 GM-%(50ML) IV SOLR
INTRAVENOUS | Status: DC | PRN
Start: 2020-03-09 — End: 2020-03-09
  Administered 2020-03-09: 2 g via INTRAVENOUS

## 2020-03-09 MED ORDER — DEXAMETHASONE SODIUM PHOSPHATE 10 MG/ML IJ SOLN
10.0000 mg | Freq: Once | INTRAMUSCULAR | Status: AC
  Administered 2020-03-09: 10 mg via INTRAVENOUS
  Filled 2020-03-09: qty 1

## 2020-03-09 MED ORDER — LORATADINE 10 MG PO TABS
10.0000 mg | ORAL_TABLET | Freq: Every day | ORAL | Status: DC
  Administered 2020-03-10 – 2020-03-11 (×2): 10 mg via ORAL
  Filled 2020-03-09 (×2): qty 1

## 2020-03-09 MED ORDER — BISACODYL 10 MG RE SUPP: 10.0000 mg | Freq: Every day | RECTAL | Status: DC | PRN

## 2020-03-09 MED ORDER — LACTATED RINGERS IV SOLN: INTRAVENOUS | Status: DC

## 2020-03-09 MED ORDER — LIDOCAINE HCL (CARDIAC) PF 100 MG/5ML IV SOSY
PREFILLED_SYRINGE | INTRAVENOUS | Status: DC | PRN
  Administered 2020-03-09: 30 mg via INTRAVENOUS

## 2020-03-09 MED ORDER — ACETAMINOPHEN 650 MG RE SUPP: 650.0000 mg | RECTAL | Status: DC | PRN

## 2020-03-09 MED ORDER — LAMOTRIGINE 150 MG PO TABS
300.0000 mg | ORAL_TABLET | Freq: Every day | ORAL | Status: DC
  Administered 2020-03-10 – 2020-03-11 (×2): 300 mg via ORAL
  Filled 2020-03-09 (×2): qty 2

## 2020-03-09 MED ORDER — ALBUTEROL SULFATE HFA 108 (90 BASE) MCG/ACT IN AERS: 1.0000 | INHALATION_SPRAY | RESPIRATORY_TRACT | Status: DC | PRN

## 2020-03-09 MED ORDER — PHENYLEPHRINE 40 MCG/ML (10ML) SYRINGE FOR IV PUSH (FOR BLOOD PRESSURE SUPPORT)
PREFILLED_SYRINGE | INTRAVENOUS | Status: AC
  Filled 2020-03-09: qty 10

## 2020-03-09 MED ORDER — SUGAMMADEX SODIUM 200 MG/2ML IV SOLN
INTRAVENOUS | Status: DC | PRN
Start: 2020-03-09 — End: 2020-03-09
  Administered 2020-03-09: 400 mg via INTRAVENOUS

## 2020-03-09 MED ORDER — HYDROMORPHONE HCL 1 MG/ML IJ SOLN
INTRAMUSCULAR | Status: AC
  Filled 2020-03-09: qty 1

## 2020-03-09 MED ORDER — CHLORHEXIDINE GLUCONATE 0.12 % MT SOLN
15.0000 mL | Freq: Once | OROMUCOSAL | Status: AC
  Administered 2020-03-09: 15 mL via OROMUCOSAL
  Filled 2020-03-09: qty 15

## 2020-03-09 MED ORDER — ACETAMINOPHEN 500 MG PO TABS: 1000.0000 mg | ORAL_TABLET | Freq: Once | ORAL | Status: DC | PRN

## 2020-03-09 MED ORDER — BUPIVACAINE HCL (PF) 0.25 % IJ SOLN
INTRAMUSCULAR | Status: DC | PRN
  Administered 2020-03-09: 20 mL

## 2020-03-09 MED ORDER — DIAZEPAM 5 MG PO TABS: 5.0000 mg | ORAL_TABLET | Freq: Four times a day (QID) | ORAL | Status: DC | PRN

## 2020-03-09 MED ORDER — DEXAMETHASONE SODIUM PHOSPHATE 10 MG/ML IJ SOLN
INTRAMUSCULAR | Status: AC
  Filled 2020-03-09: qty 1

## 2020-03-09 MED ORDER — FERROUS GLUCONATE 324 (38 FE) MG PO TABS
324.0000 mg | ORAL_TABLET | Freq: Every day | ORAL | Status: DC
  Administered 2020-03-10 – 2020-03-11 (×2): 324 mg via ORAL
  Filled 2020-03-09 (×3): qty 1

## 2020-03-09 MED ORDER — ONDANSETRON HCL 4 MG/2ML IJ SOLN
INTRAMUSCULAR | Status: DC | PRN
  Administered 2020-03-09: 4 mg via INTRAVENOUS

## 2020-03-09 MED ORDER — ASCORBIC ACID 500 MG PO TABS
1000.0000 mg | ORAL_TABLET | Freq: Every day | ORAL | Status: DC
  Administered 2020-03-09 – 2020-03-11 (×3): 1000 mg via ORAL
  Filled 2020-03-09 (×3): qty 2

## 2020-03-09 MED ORDER — LIDOCAINE 2% (20 MG/ML) 5 ML SYRINGE
INTRAMUSCULAR | Status: DC | PRN
  Administered 2020-03-09: 60 mg via INTRAVENOUS

## 2020-03-09 MED ORDER — ONDANSETRON HCL 4 MG PO TABS: 4.0000 mg | ORAL_TABLET | Freq: Four times a day (QID) | ORAL | Status: DC | PRN

## 2020-03-09 MED ORDER — IRON 28 MG PO TABS: 28.0000 mg | ORAL_TABLET | Freq: Every day | ORAL | Status: DC

## 2020-03-09 MED ORDER — VANCOMYCIN HCL 1000 MG IV SOLR
INTRAVENOUS | Status: DC | PRN
  Administered 2020-03-09: 1000 mg via TOPICAL

## 2020-03-09 SURGICAL SUPPLY — 68 items
ADH SKN CLS APL DERMABOND .7 (GAUZE/BANDAGES/DRESSINGS) ×1
APL SKNCLS STERI-STRIP NONHPOA (GAUZE/BANDAGES/DRESSINGS) ×1
BAG DECANTER FOR FLEXI CONT (MISCELLANEOUS) ×3 IMPLANT
BENZOIN TINCTURE PRP APPL 2/3 (GAUZE/BANDAGES/DRESSINGS) ×3 IMPLANT
BLADE CLIPPER SURG (BLADE) IMPLANT
BUR CUTTER 7.0 ROUND (BURR) IMPLANT
BUR MATCHSTICK NEURO 3.0 LAGG (BURR) ×3 IMPLANT
CANISTER SUCT 3000ML PPV (MISCELLANEOUS) ×3 IMPLANT
CAP LCK SPNE (Orthopedic Implant) ×6 IMPLANT
CAP LOCK SPINE RADIUS (Orthopedic Implant) IMPLANT
CAP LOCKING (Orthopedic Implant) ×18 IMPLANT
CARTRIDGE OIL MAESTRO DRILL (MISCELLANEOUS) ×1 IMPLANT
CLOSURE WOUND 1/2 X4 (GAUZE/BANDAGES/DRESSINGS) ×1
CNTNR URN SCR LID CUP LEK RST (MISCELLANEOUS) ×1 IMPLANT
CONT SPEC 4OZ STRL OR WHT (MISCELLANEOUS) ×3
COVER BACK TABLE 60X90IN (DRAPES) ×3 IMPLANT
COVER WAND RF STERILE (DRAPES) ×3 IMPLANT
DECANTER SPIKE VIAL GLASS SM (MISCELLANEOUS) ×3 IMPLANT
DERMABOND ADVANCED (GAUZE/BANDAGES/DRESSINGS) ×2
DERMABOND ADVANCED .7 DNX12 (GAUZE/BANDAGES/DRESSINGS) ×1 IMPLANT
DEVICE INTERBODY ELEVATE 23X10 (Cage) ×4 IMPLANT
DEVICE INTERBODY ELEVATE 23X8 (Cage) ×6 IMPLANT
DIFFUSER DRILL AIR PNEUMATIC (MISCELLANEOUS) ×3 IMPLANT
DRAPE C-ARM 42X72 X-RAY (DRAPES) ×6 IMPLANT
DRAPE HALF SHEET 40X57 (DRAPES) IMPLANT
DRAPE LAPAROTOMY 100X72X124 (DRAPES) ×3 IMPLANT
DRAPE SURG 17X23 STRL (DRAPES) ×12 IMPLANT
DRSG OPSITE POSTOP 4X6 (GAUZE/BANDAGES/DRESSINGS) ×3 IMPLANT
DRSG OPSITE POSTOP 4X8 (GAUZE/BANDAGES/DRESSINGS) ×2 IMPLANT
DURAPREP 26ML APPLICATOR (WOUND CARE) ×3 IMPLANT
ELECT REM PT RETURN 9FT ADLT (ELECTROSURGICAL) ×3
ELECTRODE REM PT RTRN 9FT ADLT (ELECTROSURGICAL) ×1 IMPLANT
EVACUATOR 1/8 PVC DRAIN (DRAIN) IMPLANT
GAUZE 4X4 16PLY RFD (DISPOSABLE) IMPLANT
GAUZE SPONGE 4X4 12PLY STRL (GAUZE/BANDAGES/DRESSINGS) IMPLANT
GLOVE BIO SURGEON STRL SZ 6.5 (GLOVE) ×2 IMPLANT
GLOVE BIO SURGEONS STRL SZ 6.5 (GLOVE) ×1
GLOVE BIOGEL PI IND STRL 6.5 (GLOVE) ×1 IMPLANT
GLOVE BIOGEL PI INDICATOR 6.5 (GLOVE) ×2
GLOVE ECLIPSE 9.0 STRL (GLOVE) ×6 IMPLANT
GOWN STRL REUS W/ TWL LRG LVL3 (GOWN DISPOSABLE) IMPLANT
GOWN STRL REUS W/ TWL XL LVL3 (GOWN DISPOSABLE) ×2 IMPLANT
GOWN STRL REUS W/TWL 2XL LVL3 (GOWN DISPOSABLE) IMPLANT
GOWN STRL REUS W/TWL LRG LVL3 (GOWN DISPOSABLE)
GOWN STRL REUS W/TWL XL LVL3 (GOWN DISPOSABLE) ×6
KIT BASIN OR (CUSTOM PROCEDURE TRAY) ×3 IMPLANT
KIT TURNOVER KIT B (KITS) ×3 IMPLANT
MILL MEDIUM DISP (BLADE) ×3 IMPLANT
NEEDLE HYPO 22GX1.5 SAFETY (NEEDLE) ×3 IMPLANT
NS IRRIG 1000ML POUR BTL (IV SOLUTION) ×3 IMPLANT
OIL CARTRIDGE MAESTRO DRILL (MISCELLANEOUS) ×3
PACK LAMINECTOMY NEURO (CUSTOM PROCEDURE TRAY) ×3 IMPLANT
ROD 5.5X60MM PURPLE (Rod) ×2 IMPLANT
ROD 70MM (Rod) ×3 IMPLANT
ROD SPNL 70X5.5 NS TI RDS (Rod) IMPLANT
SCREW 5.75X40M (Screw) ×12 IMPLANT
SPACER SPNL STD 23X8XSTRL (Cage) IMPLANT
SPCR SPNL STD 23X8XSTRL (Cage) ×2 IMPLANT
SPONGE SURGIFOAM ABS GEL 100 (HEMOSTASIS) ×3 IMPLANT
STRIP CLOSURE SKIN 1/2X4 (GAUZE/BANDAGES/DRESSINGS) ×3 IMPLANT
SUT VIC AB 0 CT1 18XCR BRD8 (SUTURE) ×2 IMPLANT
SUT VIC AB 0 CT1 8-18 (SUTURE) ×6
SUT VIC AB 2-0 CT1 18 (SUTURE) ×3 IMPLANT
SUT VIC AB 3-0 SH 8-18 (SUTURE) ×6 IMPLANT
TOWEL GREEN STERILE (TOWEL DISPOSABLE) ×3 IMPLANT
TOWEL GREEN STERILE FF (TOWEL DISPOSABLE) ×3 IMPLANT
TRAY FOLEY MTR SLVR 16FR STAT (SET/KITS/TRAYS/PACK) ×3 IMPLANT
WATER STERILE IRR 1000ML POUR (IV SOLUTION) ×3 IMPLANT

## 2020-03-09 NOTE — Brief Op Note (Signed)
03/09/2020  2:01 PM  PATIENT:  Nancy Bowers  44 y.o. female  PRE-OPERATIVE DIAGNOSIS:  Spondylolisthesis  POST-OPERATIVE DIAGNOSIS:  Spondylolisthesis  PROCEDURE:  Procedure(s): Lumbar Four-Five, Lumbar Five-Sacral One Posterior Lumbar Fusion and Pedicle Screw Fixation (N/A)  SURGEON:  Surgeon(s) and Role:    Julio Sicks, MD - Primary  PHYSICIAN ASSISTANT:   ASSISTANTSMarland Mcalpine   ANESTHESIA:   general  EBL:  300 mL   BLOOD ADMINISTERED:none  DRAINS: none   LOCAL MEDICATIONS USED:  MARCAINE     SPECIMEN:  No Specimen  DISPOSITION OF SPECIMEN:  N/A  COUNTS:  YES  TOURNIQUET:  * No tourniquets in log *  DICTATION: .Dragon Dictation  PLAN OF CARE: Admit to inpatient   PATIENT DISPOSITION:  PACU - hemodynamically stable.   Delay start of Pharmacological VTE agent (>24hrs) due to surgical blood loss or risk of bleeding: yes

## 2020-03-09 NOTE — Op Note (Signed)
Date of procedure: 03/09/2020  Date of dictation: Same  Service: Neurosurgery  Preoperative diagnosis: L4-5, L5-S1 degenerative spondylolisthesis with stenosis  Postoperative diagnosis: Same  Procedure Name: Bilateral L4-5 and L5-S1 decompressive laminotomy and foraminotomies, more than would be required for simple interbody fusion alone.  L4-5, L5-S1 posterior lumbar interbody fusion utilizing interbody cages and locally harvested autograft  L4, L5, S1 posterior lateral arthrodesis utilizing segmental pedicle screw fixation and local autograft  Surgeon:Garyn Waguespack A.Jayleene Glaeser, M.D.  Asst. Surgeon: Doran Durand, NP  Anesthesia: General  Indication: 44 year old female with severe back and left lower extremity pain paresthesias and weakness.  Work-up demonstrates evidence of significant disc space collapse with degenerative spondylolisthesis and severe foraminal stenosis on the left at L4-5 and particularly at L5-S1 with evidence of dynamic spondylolisthesis at L5-S1.  Patient failed conservative management presents now for decompression and fusion in hopes of improving her symptoms.  Operative note: After induction of anesthesia, patient positioned prone on Wilson frame and properly padded.  Lumbar region prepped and draped sterilely.  Incision made overlying L4-S1.  Dissection performed bilaterally.  Retractor placed.  Fluoroscopy used.  Levels confirmed.  Decompressive laminotomies were then performed bilaterally using Leksell rongeurs and Kerrison rongeurs to remove the inferior two thirds of the lamina above the entire facet joint complex laterally and the superior aspect of the lamina below.  Ligament flavum elevated and resected.  Foraminotomies completed L4, L5 and S1 nerve roots.  Epidural venous plexus coagulated and cut.  Bilateral discectomy.  This patient.  With distractors placed the patient's right side dissipates then prepared on the left side.  A 10 mm extra lordotic [employer] expandable cage  was then impacted into place packed with locally harvested autograft and fully expanded.  Distractor removed patient's right side.  Dissipates then prepared on the right side at L5-S1.  Morselized autograft packed in interspace.  A second 10 mm extra lordotic cage was then impacted in place and expanded to its full extent.  Procedures then repeated at L4-5 using 8 mm standard expandable implants and local autograft.  Pedicles at L4-L5 and S1 were identified using surface landmarks and intraoperative fluoroscopy superficial bone about the pedicle was then removed and the high-speed drill.  Pedicle was then probed using the pedicle all each pedicle tract was then probed and found to be solidly within the bone.  Each pedicle tract was then tapped with a screw tap and then 5.75 mm radius brand screws from [employer] were placed bilaterally at L4, L5 and S1.  Final images reveal good position of the cages and the hardware at the proper upper level with normal alignment of spine.  Short segment titanium rod placed over the screw heads from L4-S1.  Locking caps placed over the screws and locking caps then engaged with a construct under compression.  Transverse processes and sacral ala were decorticated.  Morselized autograft was packed posterior laterally.  Gelfoam was placed over the laminotomy defects.  Vancomycin powder was placed in deep wound space.  Wounds and closed in layers with Vicryl sutures.  Steri-Strips and sterile dressing applied.  No apparent complications.  Patient tolerated the procedure well and she returns to the recovery room postop.
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
## 2020-03-09 NOTE — Anesthesia Procedure Notes (Addendum)
Procedure Name: Intubation Date/Time: 03/09/2020 10:20 AM Performed by: Lavell Luster, CRNA Pre-anesthesia Checklist: Patient identified, Emergency Drugs available, Suction available, Patient being monitored and Timeout performed Patient Re-evaluated:Patient Re-evaluated prior to induction Preoxygenation: Pre-oxygenation with 100% oxygen Induction Type: IV induction Ventilation: Mask ventilation without difficulty Laryngoscope Size: Mac and 4 Grade View: Grade I Tube type: Oral Tube size: 7.0 mm Number of attempts: 1 Airway Equipment and Method: Stylet and Video-laryngoscopy Placement Confirmation: ETT inserted through vocal cords under direct vision,  positive ETCO2 and breath sounds checked- equal and bilateral Secured at: 21 cm Tube secured with: Tape Dental Injury: Teeth and Oropharynx as per pre-operative assessment

## 2020-03-09 NOTE — Transfer of Care (Signed)
Immediate Anesthesia Transfer of Care Note  Patient: Nancy Bowers  Procedure(s) Performed: Lumbar Four-Five, Lumbar Five-Sacral One Posterior Lumbar Fusion and Pedicle Screw Fixation (N/A Spine Lumbar)  Patient Location: PACU  Anesthesia Type:General  Level of Consciousness: awake, alert  and sedated  Airway & Oxygen Therapy: Patient connected to face mask oxygen  Post-op Assessment: Post -op Vital signs reviewed and stable  Post vital signs: stable  Last Vitals:  Vitals Value Taken Time  BP 132/83 03/09/20 1421  Temp    Pulse 76 03/09/20 1423  Resp 15 03/09/20 1423  SpO2 100 % 03/09/20 1423  Vitals shown include unvalidated device data.  Last Pain:  Vitals:   03/09/20 1421  TempSrc:   PainSc: (P) Asleep      Patients Stated Pain Goal: 5 (03/09/20 0902)  Complications: No complications documented.

## 2020-03-09 NOTE — Progress Notes (Signed)
Orthopedic Tech Progress Note Patient Details:  Nancy Bowers February 26, 1976 [id number] Called in order to [facility]  For an ASPEN LUMBAR BRACE Patient ID: Nancy Bowers, female   DOB: 02/06/1976, 44 y.o.   MRN: [id number]   Donald Pore 03/09/2020, 3:56 PM

## 2020-03-09 NOTE — Anesthesia Preprocedure Evaluation (Signed)
Anesthesia Evaluation  Patient identified by MRN, date of birth, ID band Patient awake    Reviewed: Allergy & Precautions, H&P , NPO status , Patient's Chart, lab work & pertinent test results  Airway Mallampati: II   Neck ROM: full    Dental   Pulmonary asthma , COPD, Current Smoker,    breath sounds clear to auscultation       Cardiovascular negative cardio ROS   Rhythm:regular Rate:Normal     Neuro/Psych  Headaches, PSYCHIATRIC DISORDERS Anxiety Depression Bipolar Disorder    GI/Hepatic negative GI ROS, Neg liver ROS,   Endo/Other  negative endocrine ROS  Renal/GU negative Renal ROS     Musculoskeletal  (+) Arthritis ,   Abdominal   Peds  Hematology negative hematology ROS (+)   Anesthesia Other Findings   Reproductive/Obstetrics                             Anesthesia Physical Anesthesia Plan  ASA: II  Anesthesia Plan: General   Post-op Pain Management:    Induction: Intravenous  PONV Risk Score and Plan: 2 and Ondansetron and Dexamethasone  Airway Management Planned: Oral ETT  Additional Equipment: None  Intra-op Plan:   Post-operative Plan: Extubation in OR  Informed Consent: I have reviewed the patients History and Physical, chart, labs and discussed the procedure including the risks, benefits and alternatives for the proposed anesthesia with the patient or authorized representative who has indicated his/her understanding and acceptance.     Dental advisory given  Plan Discussed with: CRNA and Surgeon  Anesthesia Plan Comments:         Anesthesia Quick Evaluation

## 2020-03-10 LAB — RAPID URINE DRUG SCREEN, HOSP PERFORMED
Amphetamines: POSITIVE — AB
Barbiturates: NOT DETECTED
Benzodiazepines: POSITIVE — AB
Cocaine: NOT DETECTED
Opiates: POSITIVE — AB
Tetrahydrocannabinol: NOT DETECTED

## 2020-03-10 MED ORDER — NICOTINE 21 MG/24HR TD PT24
21.0000 mg | MEDICATED_PATCH | Freq: Every day | TRANSDERMAL | Status: DC
  Administered 2020-03-10 – 2020-03-11 (×2): 21 mg via TRANSDERMAL
  Filled 2020-03-10 (×2): qty 1

## 2020-03-10 MED FILL — Heparin Sodium (Porcine) Inj 1000 Unit/ML: INTRAMUSCULAR | Qty: 30 | Status: CN

## 2020-03-10 MED FILL — Heparin Sodium (Porcine) Inj 1000 Unit/ML: INTRAMUSCULAR | Qty: 30 | Status: AC

## 2020-03-10 MED FILL — Sodium Chloride IV Soln 0.9%: INTRAVENOUS | Qty: 2000 | Status: AC

## 2020-03-10 NOTE — Evaluation (Signed)
Occupational Therapy Evaluation Patient Details Name: Nancy Bowers MRN: [id number] DOB: 11/12/1975 Today's Date: 03/10/2020    History of Present Illness Patient is a 44 year old female with severe back and L LE pain, weakness, parathesia. Pt s/p Bilateral L4-5 and L5-S1 decompressive laminotomy and foraminotomies   Clinical Impression   Patient with functional deficits listed below impacting safety and independence with self care. Unsure of patient's baseline mentation, however this morning patient is very groggy requiring constant cues to keep eyes open with patient leaning posteriorly seated on bed. Patient trying to eat breakfast upon arrival however spilling juice, unable to open containers requiring assistance. Patient require mod A for UB and LB dressing due to poor safety awareness/adherance to back precautions, difficulty problem solving. Will continue to follow.    Follow Up Recommendations  Follow surgeon's recommendation for DC plan and follow-up therapies;Supervision/Assistance - 24 hour    Equipment Recommendations  None recommended by OT       Precautions / Restrictions Precautions Precautions: Back Precaution Booklet Issued: Yes (comment) Required Braces or Orthoses: Spinal Brace Spinal Brace: Lumbar corset;Applied in sitting position Restrictions Weight Bearing Restrictions: No      Mobility Bed Mobility               General bed mobility comments: seated at edge of bed upon arrival  Transfers Overall transfer level: Needs assistance Equipment used: 1 person hand held assist Transfers: Sit to/from Stand Sit to Stand: Min assist         General transfer comment: posterior lean, poor balance, decreased safety    Balance Overall balance assessment: Needs assistance Sitting-balance support: Single extremity supported;Feet supported Sitting balance-Leahy Scale: Poor   Postural control: Posterior lean Standing balance support: Single extremity  supported;No upper extremity supported;During functional activity Standing balance-Leahy Scale: Poor Standing balance comment: requiring min A due to posterior loss of balance standing during dressing                           ADL either performed or assessed with clinical judgement   ADL Overall ADL's : Needs assistance/impaired Eating/Feeding: Supervision/ safety;Sitting Eating/Feeding Details (indicate cue type and reason): assist to cut up food, supervision due to groggy frequently dropping and spilling food Grooming: Wash/dry face;Wash/dry hands;Supervision/safety;Sitting   Upper Body Bathing: Sitting;Minimal assistance   Lower Body Bathing: Sitting/lateral leans;Moderate assistance   Upper Body Dressing : Moderate assistance;Sitting Upper Body Dressing Details (indicate cue type and reason): to don bra and assist threading UEs through dress Lower Body Dressing: Moderate assistance;Sitting/lateral leans;Sit to/from stand;Cueing for compensatory techniques;Adhering to back precautions;Cueing for safety Lower Body Dressing Details (indicate cue type and reason): assist to thread LEs into underwear, min A for balance in standing to pull up over hips Toilet Transfer: Minimal assistance;Cueing for safety Toilet Transfer Details (indicate cue type and reason): simulated with functional mobility, decreased safety and balance with posterior lean Toileting- Clothing Manipulation and Hygiene: Moderate assistance       Functional mobility during ADLs: Minimal assistance;Cueing for safety General ADL Comments: patient requiring increased assistance with self care, suspect due to medications                  Pertinent Vitals/Pain Pain Assessment: Faces Faces Pain Scale: Hurts a little bit Pain Location: back Pain Descriptors / Indicators: Sore Pain Intervention(s): Premedicated before session;Monitored during session     Hand Dominance Right   Extremity/Trunk  Assessment Upper Extremity Assessment Upper  Extremity Assessment: Generalized weakness   Lower Extremity Assessment Lower Extremity Assessment: Defer to PT evaluation   Cervical / Trunk Assessment Cervical / Trunk Assessment: Normal   Communication Communication Communication: Expressive difficulties;Other (comment) (pt very groggy with garbled speech)   Cognition Arousal/Alertness: Lethargic;Suspect due to medications Behavior During Therapy: Impulsive Overall Cognitive Status: No family/caregiver present to determine baseline cognitive functioning                                 General Comments: patient is very groggy, constantly falling asleep requiring cues to keep eyes open. Patient stating its because shes in pain and thinks laying back would feel better. requires max cues to redirect to tasks, high fall risk currently.               Home Living Family/patient expects to be discharged to:: Private residence Living Arrangements: Spouse/significant other;Children;Parent Available Help at Discharge: Family;Available 24 hours/day Type of Home: Mobile home Home Access: Stairs to enter [employer] of Steps: 8-9 Front, 5 Back Entrance Stairs-Rails: Right;Left Home Layout: One level     Bathroom Shower/Tub: Teacher, early years/pre: Handicapped height     Home Equipment: Shower seat          Prior Functioning/Environment Level of Independence: Needs assistance  Gait / Transfers Assistance Needed: spouse assists up/down stairs ADL's / Homemaking Assistance Needed: initially stating no assist, when getting dressed pt states "my husband helps me with this" getting underwear on and donning bra            OT Problem List: Decreased activity tolerance;Decreased strength;Impaired balance (sitting and/or standing);Pain;Decreased knowledge of precautions;Decreased safety awareness;Decreased cognition      OT Treatment/Interventions:  Self-care/ADL training;Therapeutic exercise;Energy conservation;DME and/or AE instruction;Therapeutic activities;Cognitive remediation/compensation;Patient/family education;Balance training    OT Goals(Current goals can be found in the care plan section) Acute Rehab OT Goals Patient Stated Goal: "I want my muffin" OT Goal Formulation: With patient Time For Goal Achievement: 03/24/20 Potential to Achieve Goals: Good  OT Frequency: Min 3X/week    AM-PAC OT "6 Clicks" Daily Activity     Outcome Measure Help from another person eating meals?: A Little Help from another person taking care of personal grooming?: A Little Help from another person toileting, which includes using toliet, bedpan, or urinal?: A Lot Help from another person bathing (including washing, rinsing, drying)?: A Lot Help from another person to put on and taking off regular upper body clothing?: A Lot Help from another person to put on and taking off regular lower body clothing?: A Lot 6 Click Score: 14   End of Session Equipment Utilized During Treatment: Back brace Nurse Communication: Mobility status;Precautions  Activity Tolerance: Patient limited by lethargy Patient left: in bed;with call bell/phone within reach  OT Visit Diagnosis: Other abnormalities of gait and mobility (R26.89);Pain Pain - part of body:  (back)                Time: 0350-0938 OT Time Calculation (min): 38 min Charges:  OT General Charges $OT Visit: 1 Visit OT Evaluation $OT Eval Moderate Complexity: 1 Mod OT Treatments $Self Care/Home Management : 23-37 mins  Delbert Phenix OT OT office: [phone]  Rosemary Holms 03/10/2020, 8:53 AM

## 2020-03-10 NOTE — Evaluation (Signed)
Physical Therapy Evaluation Patient Details Name: Nancy Bowers MRN: [id number] DOB: [birth year] Today's Date: 03/10/2020   History of Present Illness  Patient is a 44 year old female with severe back and L LE pain, weakness, parathesia. Pt s/p Bilateral L4-5 and L5-S1 decompressive laminotomy and foraminotomies  Clinical Impression  Pt admitted with above diagnosis. At the time of PT eval, pt was able to demonstrate transfers and ambulation with up to mod assist due to lethargy and difficulty attending to task/keeping eyes open during mobility. Pt was educated on precautions, brace application/wearing schedule, however will require reinforcement. Pt currently with functional limitations due to the deficits listed below (see PT Problem List). Pt will benefit from skilled PT to increase their independence and safety with mobility to allow discharge to the venue listed below.      Follow Up Recommendations No PT follow up;Supervision for mobility/OOB    Equipment Recommendations  Rolling walker with 5" wheels    Recommendations for Other Services       Precautions / Restrictions Precautions Precautions: Back Precaution Booklet Issued: Yes (comment) Required Braces or Orthoses: Spinal Brace Spinal Brace: Lumbar corset;Applied in sitting position Restrictions Weight Bearing Restrictions: No      Mobility  Bed Mobility Overal bed mobility: Needs Assistance Bed Mobility: Rolling;Sit to Sidelying Rolling: Min assist       Sit to sidelying: Min assist General bed mobility comments: seated at edge of bed with nursing staff upon arrival. pt required min assist to elevate LE's up onto bed at end of session and position herself supine.   Transfers Overall transfer level: Needs assistance Equipment used: Rolling walker (2 wheeled) Transfers: Sit to/from Stand Sit to Stand: Min assist         General transfer comment: posterior lean, poor balance, decreased safety. min assist  required to achieve full stand.   Ambulation/Gait Ambulation/Gait assistance: Min assist;Mod assist Gait Distance (Feet): 100 Feet Assistive device: Rolling walker (2 wheeled) Gait Pattern/deviations: Shuffle;Decreased stride length Gait velocity: Decreased Gait velocity interpretation: <1.31 ft/sec, indicative of household ambulator General Gait Details: VC's for improved posture, eyes open, and attention to task. Nurse tech in hallway providing visual cues for the pt. She was able to maintain eyes open to look at how many fingers the NT was holding up, and to identify objects in the hall.   Stairs            Wheelchair Mobility    Modified Rankin (Stroke Patients Only)       Balance Overall balance assessment: Needs assistance Sitting-balance support: Single extremity supported;Feet supported Sitting balance-Leahy Scale: Poor   Postural control: Posterior lean Standing balance support: Single extremity supported;No upper extremity supported;During functional activity Standing balance-Leahy Scale: Poor Standing balance comment: requiring min A due to posterior loss of balance standing during dressing                             Pertinent Vitals/Pain Pain Assessment: Faces Faces Pain Scale: Hurts a little bit Pain Location: back Pain Descriptors / Indicators: Sore;Operative site guarding Pain Intervention(s): Limited activity within patient's tolerance;Monitored during session;Repositioned    Home Living Family/patient expects to be discharged to:: Private residence Living Arrangements: Spouse/significant other;Children;Parent Available Help at Discharge: Family;Available 24 hours/day Type of Home: Mobile home Home Access: Stairs to enter Entrance Stairs-Rails: Doctor, general practice of Steps: 8-9 Front, 5 Back Home Layout: One level Home Equipment: Shower seat Additional Comments: Pt an  extremely poor historian at the time of PT eval and  slurring words. When pt would respond to questions, I had a hard time understanding her. Info taken from OT note.     Prior Function Level of Independence: Needs assistance   Gait / Transfers Assistance Needed: spouse assists up/down stairs  ADL's / Homemaking Assistance Needed: initially stating no assist, when getting dressed pt states "my husband helps me with this" getting underwear on and donning bra  Comments: Again, info taken from OT eval as pt could not answer these questions during session.      Hand Dominance   Dominant Hand: Right    Extremity/Trunk Assessment   Upper Extremity Assessment Upper Extremity Assessment: Defer to OT evaluation    Lower Extremity Assessment Lower Extremity Assessment: Generalized weakness;LLE deficits/detail LLE Deficits / Details: Shuffling feet along however LLE appeared to be more impaired than the RLE, as pt was dragging it along and pt was reporting it hurt to try and pick it up more.     Cervical / Trunk Assessment Cervical / Trunk Assessment: Normal  Communication   Communication: Expressive difficulties;Other (comment) (pt very groggy with garbled speech)  Cognition Arousal/Alertness: Lethargic;Suspect due to medications Behavior During Therapy: Impulsive Overall Cognitive Status: No family/caregiver present to determine baseline cognitive functioning                                 General Comments: patient is very groggy, constantly falling asleep requiring cues to keep eyes open. Patient stating its because shes in pain and thinks laying back would feel better. requires max cues to redirect to tasks, high fall risk currently.       General Comments      Exercises     Assessment/Plan    PT Assessment Patient needs continued PT services  PT Problem List Decreased strength;Decreased activity tolerance;Decreased balance;Decreased mobility;Decreased knowledge of use of DME;Decreased safety awareness;Decreased  knowledge of precautions;Pain       PT Treatment Interventions DME instruction;Gait training;Stair training;Functional mobility training;Therapeutic activities;Therapeutic exercise;Neuromuscular re-education;Patient/family education    PT Goals (Current goals can be found in the Care Plan section)  Acute Rehab PT Goals Patient Stated Goal: None stated PT Goal Formulation: Patient unable to participate in goal setting Time For Goal Achievement: 03/17/20 Potential to Achieve Goals: Good    Frequency Min 5X/week   Barriers to discharge        Co-evaluation               AM-PAC PT "6 Clicks" Mobility  Outcome Measure Help needed turning from your back to your side while in a flat bed without using bedrails?: A Little Help needed moving from lying on your back to sitting on the side of a flat bed without using bedrails?: A Little Help needed moving to and from a bed to a chair (including a wheelchair)?: A Little Help needed standing up from a chair using your arms (e.g., wheelchair or bedside chair)?: A Little Help needed to walk in hospital room?: A Little Help needed climbing 3-5 steps with a railing? : A Little 6 Click Score: 18    End of Session Equipment Utilized During Treatment: Gait belt Activity Tolerance: Patient limited by lethargy Patient left: in bed;with call bell/phone within reach;with bed alarm set Nurse Communication: Mobility status PT Visit Diagnosis: Unsteadiness on feet (R26.81);Pain Pain - part of body:  (back)    Time: 9983-3825  PT Time Calculation (min) (ACUTE ONLY): 15 min   Charges:   PT Evaluation $PT Eval Low Complexity: 1 Low          Rolinda Roan, PT, DPT Acute Rehabilitation Services Pager: [phone] Office: [phone]   Thelma Comp 03/10/2020, 10:09 AM

## 2020-03-10 NOTE — Anesthesia Postprocedure Evaluation (Signed)
Anesthesia Post Note  Patient: Nancy Bowers  Procedure(s) Performed: Lumbar Four-Five, Lumbar Five-Sacral One Posterior Lumbar Fusion and Pedicle Screw Fixation (N/A Spine Lumbar)     Patient location during evaluation: PACU Anesthesia Type: General Level of consciousness: awake and alert Pain management: pain level controlled Vital Signs Assessment: post-procedure vital signs reviewed and stable Respiratory status: spontaneous breathing, nonlabored ventilation, respiratory function stable and patient connected to nasal cannula oxygen Cardiovascular status: blood pressure returned to baseline and stable Postop Assessment: no apparent nausea or vomiting Anesthetic complications: no   No complications documented.  Last Vitals:  Vitals:   03/10/20 1614 03/10/20 1937  BP: 97/60 (!) 97/57  Pulse: 75 91  Resp:  18  Temp:  36.9 C  SpO2:  99%    Last Pain:  Vitals:   03/10/20 1937  TempSrc: Oral  PainSc:                  [name]

## 2020-03-10 NOTE — Progress Notes (Signed)
Postop day 1.  Patient overall doing reasonably well.  Pain controlled.  Lower extremity pain much improved.  Mobility still limited by her chronic cervical myelopathy.  Afebrile.  Vital signs are stable.  Awake and alert.  Oriented and reasonably appropriate.  Motor examination is intact in both upper and lower extremities.  Sensory damage with decreased sensation pinprick light touch from C5 distally.  Defect of his hyper active.  Positive Hoffmann's in both hands.  Gait is spastic.  Wound clean and dry.  Overall progressing reasonably well following two-level lumbar decompression and fusion.  Continue efforts at mobilization and likely discharge home tomorrow.

## 2020-03-11 MED ORDER — NICOTINE 21 MG/24HR TD PT24: 21.0000 mg | MEDICATED_PATCH | Freq: Every day | TRANSDERMAL | 0 refills | Status: DC

## 2020-03-11 MED ORDER — OXYCODONE HCL 10 MG PO TABS: 10.0000 mg | ORAL_TABLET | ORAL | 0 refills | Status: DC | PRN

## 2020-03-11 MED ORDER — POLYETHYLENE GLYCOL 3350 17 G PO PACK: 17.0000 g | PACK | Freq: Every day | ORAL | 0 refills | Status: DC | PRN

## 2020-03-11 MED ORDER — METHOCARBAMOL 500 MG PO TABS: 500.0000 mg | ORAL_TABLET | Freq: Three times a day (TID) | ORAL | 0 refills | Status: DC | PRN

## 2020-03-11 NOTE — Discharge Summary (Signed)
Physician Discharge Summary     Providing Compassionate, Quality Care - Together   Patient ID: Nancy Bowers MRN: [id number] DOB/AGE: 11/24/1975 44 y.o.  Admit date: 03/09/2020 Discharge date: 03/11/2020  Admission Diagnoses: Degenerative spondylolisthesis  Discharge Diagnoses:  Active Problems:   Degenerative spondylolisthesis   Discharged Condition: good  Hospital Course: Patient underwent an L4-5, L5-S1 posterior lumbar fusion by Dr. Jordan Likes on 03/09/2020. She was admitted to 3C07 for observation. Her postoperative course has been uncomplicated. She has worked with both physical and occupational therapies who feel patient is ready for discharge home. She is ambulating independently and without difficulty. She is tolerating a normal diet. She is not having any bowel or bladder dysfunction. Her pain is reasonably controlled with oral pain medication. She is ready for discharge home.   Consults: rehabilitation medicine  Significant Diagnostic Studies: radiology: DG Lumbar Spine 2-3 Views  Result Date: 03/09/2020 CLINICAL DATA:  L4-S1 PLIF EXAM: DG C-ARM 1-60 MIN; LUMBAR SPINE - 2-3 VIEW CONTRAST:  None FLUOROSCOPY TIME:  Fluoroscopy Time:  42 seconds Number of Acquired Spot Images: 2 COMPARISON:  MRI 02/17/2020 FINDINGS: Two low resolution intraoperative spot views of the lumbar spine. The images demonstrate transpedicular screws at L4, L5 and S1 with interbody devices at L4-L5 and L5-S1. IMPRESSION: Intraoperative fluoroscopic assistance provided during lumbar spine surgery. Electronically Signed   By: Jasmine Pang M.D.   On: 03/09/2020 15:59   DG C-Arm 1-60 Min  Result Date: 03/09/2020 CLINICAL DATA:  L4-S1 PLIF EXAM: DG C-ARM 1-60 MIN; LUMBAR SPINE - 2-3 VIEW CONTRAST:  None FLUOROSCOPY TIME:  Fluoroscopy Time:  42 seconds Number of Acquired Spot Images: 2 COMPARISON:  MRI 02/17/2020 FINDINGS: Two low resolution intraoperative spot views of the lumbar spine. The images demonstrate  transpedicular screws at L4, L5 and S1 with interbody devices at L4-L5 and L5-S1. IMPRESSION: Intraoperative fluoroscopic assistance provided during lumbar spine surgery. Electronically Signed   By: Jasmine Pang M.D.   On: 03/09/2020 15:59     Treatments: surgery:  Bilateral L4-5 and L5-S1 decompressive laminotomy and foraminotomies, more than would be required for simple interbody fusion alone. L4-5, L5-S1 posterior lumbar interbody fusion utilizing interbody cages ([employer]) and locally harvested autograft. L4, L5, S1 posterior lateral arthrodesis utilizing segmental pedicle screw ([employer] Radius) fixation and local autograft.  Discharge Exam: Blood pressure 100/66, pulse 66, temperature 98.7 F (37.1 C), temperature source Oral, resp. rate 20, height 5\' 5"  (1.651 m), weight 70 kg, last menstrual period 02/24/2020, SpO2 99 %.   Alert and oriented x 4 PERRLA CN II-XII grossly intact MAE, left foot with 4-/5 EHL and anterior tibialis function. Sensory examination with decrease sensation pinprick and light touch in her left L5 and S1 dermatomes Incision is covered with Honeycomb dressing and Steri Strips; Dressing is clean, dry, and intact   Disposition: Discharge disposition: 01-Home or Self Care        Allergies as of 03/11/2020      Reactions   Aspirin Swelling   throat   Bee Venom Hives   Fentanyl Rash   Tylenol [acetaminophen] Swelling   Tolerates percocet and norco      Medication List    STOP taking these medications   ibuprofen 800 MG tablet Commonly known as: ADVIL     TAKE these medications   albuterol 108 (90 Base) MCG/ACT inhaler Commonly known as: VENTOLIN HFA Inhale 1-2 puffs into the lungs every 4 (four) hours as needed for wheezing or shortness of breath.   amitriptyline  100 MG tablet Commonly known as: ELAVIL Take 1 tablet (100 mg total) by mouth at bedtime.   amphetamine-dextroamphetamine 15 MG 24 hr capsule Commonly known as: Adderall XR Take 1  capsule by mouth daily. What changed: Another medication with the same name was removed. Continue taking this medication, and follow the directions you see here.   cetirizine 10 MG tablet Commonly known as: ZYRTEC Take 10 mg by mouth daily.   clonazePAM 0.5 MG tablet Commonly known as: KLONOPIN Take 1 tablet (0.5 mg total) by mouth 3 (three) times daily as needed for anxiety. What changed: when to take this   desvenlafaxine 100 MG 24 hr tablet Commonly known as: PRISTIQ Take 100 mg by mouth daily.   gabapentin 400 MG capsule Commonly known as: NEURONTIN Take 400 mg by mouth 3 (three) times daily.   Iron 28 MG Tabs Take 28 mg by mouth daily.   lamoTRIgine 100 MG tablet Commonly known as: LAMICTAL Take 3 tablets (300 mg total) by mouth daily.   lidocaine 5 % Commonly known as: Lidoderm Place 1 patch onto the skin daily. Remove & Discard patch within 12 hours or as directed by MD   methocarbamol 500 MG tablet Commonly known as: ROBAXIN Take 1-2 tablets (500-1,000 mg total) by mouth every 8 (eight) hours as needed for muscle spasms. What changed: how much to take   nicotine 21 mg/24hr patch Commonly known as: NICODERM CQ - dosed in mg/24 hours Place 1 patch (21 mg total) onto the skin daily. Start taking on: March 12, 2020   Oxycodone HCl 10 MG Tabs Take 1 tablet (10 mg total) by mouth every 4 (four) hours as needed for severe pain ((score 7 to 10)).   polyethylene glycol 17 g packet Commonly known as: MIRALAX / GLYCOLAX Take 17 g by mouth daily as needed for mild constipation.   vitamin C 1000 MG tablet Take 1,000 mg by mouth daily.   Xtampza ER 13.5 MG C12a Generic drug: oxyCODONE ER Take 13.5 mg by mouth in the morning and at bedtime.            Durable Medical Equipment  (From admission, onward)         Start     Ordered   03/09/20 1532  DME Walker rolling  Once       Question:  Patient needs a walker to treat with the following condition  Answer:   Degenerative spondylolisthesis   03/09/20 1531   03/09/20 1532  DME 3 n 1  Once        03/09/20 1531          Follow-up Information    Earnie Larsson, MD. Go in 2 week(s).   Specialty: Neurosurgery Contact information: [address] [place] [address] [phone]               Signed: Patricia Nettle 03/11/2020, 9:29 AM

## 2020-03-11 NOTE — Discharge Instructions (Signed)
Wound Care °Keep incision covered and dry for two days.    °Do not put any creams, lotions, or ointments on incision. °Leave steri-strips on back.  They will fall off by themselves. °Activity °Walk each and every day, increasing distance each day. °No lifting greater than 5 lbs.  Avoid excessive neck motion. °No driving for 2 weeks; may ride as a passenger locally. °Diet °Resume your normal diet.  °Return to Work °Will be discussed at your follow up appointment. °Call Your Doctor If Any of These Occur °Redness, drainage, or swelling at the wound.  °Temperature greater than 101 degrees. °Severe pain not relieved by pain medication. °Incision starts to come apart. °Follow Up Appt °Call today for appointment in 1-2 weeks ([phone]) or for problems.  °

## 2020-03-11 NOTE — Plan of Care (Signed)
Pt and husband given D/C instructions with verbal understanding. Rx's were sent to the pharmacy by MD. Pt's incision is clean and dry with no sign of infection. Pt's IV was removed prior to D/C. Pt received RW and 3-n-1 per MD order. Pt given cab voucher for D/C home. Pt D/C'd home via wheelchair per MD order. Pt is stable @ D/C and has no other needs at this time. Rema Fendt, RN

## 2020-03-11 NOTE — Progress Notes (Signed)
Physical Therapy Treatment Patient Details Name: Nancy Bowers MRN: [id number] DOB: Feb 11, 1976 Today's Date: 03/11/2020    History of Present Illness Patient is a 44 year old female with severe back and L LE pain, weakness, parathesia. Pt s/p Bilateral L4-5 and L5-S1 decompressive laminotomy and foraminotomies    PT Comments    Pt progressing well with post-op mobility. She was able to demonstrate transfers and ambulation with gross min guard assist to min assist and RW for support. Education was reinforced on precautions, brace application/wearing schedule, appropriate activity progression, and car transfer. Will continue to follow.      Follow Up Recommendations  No PT follow up;Supervision for mobility/OOB     Equipment Recommendations  Rolling walker with 5" wheels    Recommendations for Other Services       Precautions / Restrictions Precautions Precautions: Back Precaution Booklet Issued: Yes (comment) Precaution Comments: verbally reviewed precautions during functional mobility.  Required Braces or Orthoses: Spinal Brace Spinal Brace: Lumbar corset;Applied in sitting position Restrictions Weight Bearing Restrictions: No    Mobility  Bed Mobility Overal bed mobility: Needs Assistance Bed Mobility: Rolling;Sit to Sidelying Rolling: Supervision       Sit to sidelying: Min guard General bed mobility comments: Sitting up in bed eating breakfast when PT arrived. Pt was able to transition to EOB from flat bed and rails lowered to simulate home environment. VC's for improved log roll technique.   Transfers Overall transfer level: Needs assistance Equipment used: Rolling walker (2 wheeled) Transfers: Sit to/from Stand Sit to Stand: Min guard         General transfer comment: Close guard as pt powered up to full stand. VC's for hand placement on seated surface but pt wanting to pull to stand from center bar of RW  Ambulation/Gait Ambulation/Gait assistance: Min  assist Gait Distance (Feet): 200 Feet Assistive device: Rolling walker (2 wheeled) Gait Pattern/deviations: Shuffle;Decreased stride length Gait velocity: Decreased Gait velocity interpretation: <1.31 ft/sec, indicative of household ambulator General Gait Details: VC's for improved posture, closer walker proximity, and forward gaze. No overt LOB throughout gait training however pt appears unsteady.    Stairs             Wheelchair Mobility    Modified Rankin (Stroke Patients Only)       Balance Overall balance assessment: Needs assistance Sitting-balance support: Single extremity supported;Feet supported Sitting balance-Leahy Scale: Fair   Postural control: Posterior lean Standing balance support: Bilateral upper extremity supported;During functional activity Standing balance-Leahy Scale: Poor Standing balance comment: external support required in standing                            Cognition Arousal/Alertness: Awake/alert Behavior During Therapy: WFL for tasks assessed/performed;Anxious Overall Cognitive Status: No family/caregiver present to determine baseline cognitive functioning                                 General Comments: mentation appears improved from yesterday. Some difficulty dual tasking and problem solving. No family present to confirm baseline      Exercises      General Comments        Pertinent Vitals/Pain Pain Assessment: 0-10 Pain Score: 8  Faces Pain Scale: Hurts a little bit Pain Location: back - pt reporting severe pain however faces pain scale is at a 2 Pain Descriptors / Indicators: Sore;Operative site guarding Pain  Intervention(s): Limited activity within patient's tolerance;Monitored during session;Repositioned    Home Living                      Prior Function            PT Goals (current goals can now be found in the care plan section) Acute Rehab PT Goals Patient Stated Goal: None  stated PT Goal Formulation: With patient Time For Goal Achievement: 03/17/20 Potential to Achieve Goals: Good Progress towards PT goals: Progressing toward goals    Frequency    Min 5X/week      PT Plan Current plan remains appropriate    Co-evaluation              AM-PAC PT "6 Clicks" Mobility   Outcome Measure  Help needed turning from your back to your side while in a flat bed without using bedrails?: A Little Help needed moving from lying on your back to sitting on the side of a flat bed without using bedrails?: A Little Help needed moving to and from a bed to a chair (including a wheelchair)?: A Little Help needed standing up from a chair using your arms (e.g., wheelchair or bedside chair)?: A Little Help needed to walk in hospital room?: A Little Help needed climbing 3-5 steps with a railing? : A Little 6 Click Score: 18    End of Session Equipment Utilized During Treatment: Gait belt Activity Tolerance: Patient limited by lethargy Patient left: in bed;with call bell/phone within reach;with bed alarm set Nurse Communication: Mobility status PT Visit Diagnosis: Unsteadiness on feet (R26.81);Pain Pain - part of body:  (back)     Time: 3086-5784 PT Time Calculation (min) (ACUTE ONLY): 24 min  Charges:  $Gait Training: 23-37 mins                     Conni Slipper, PT, DPT Acute Rehabilitation Services Pager: [phone] Office: [phone]    Marylynn Pearson 03/11/2020, 11:16 AM

## 2020-03-11 NOTE — Progress Notes (Signed)
Occupational Therapy Treatment Patient Details Name: Nancy Bowers MRN: [id number] DOB: November 19, 1975 Today's Date: 03/11/2020    History of present illness Patient is a 44 year old female with severe back and L LE pain, weakness, parathesia. Pt s/p Bilateral L4-5 and L5-S1 decompressive laminotomy and foraminotomies   OT comments  Pt progressing towards acute OT goals. Completed in room mobility and transfers at min guard level utilizing rw. Reviewed ADL education and issued AE for LB ADLs. Mentation seems improved from yesterday, unclear baseline and no family present to confirm if she is fully back to cognitive baseline. Appears internally distracted at times. D/c plan remains appropriate.    Follow Up Recommendations  Follow surgeon's recommendation for DC plan and follow-up therapies;Supervision/Assistance - 24 hour    Equipment Recommendations  None recommended by OT    Recommendations for Other Services      Precautions / Restrictions Precautions Precautions: Back Precaution Booklet Issued: Yes (comment) Precaution Comments: verbally reviewed precautions Required Braces or Orthoses: Spinal Brace Spinal Brace: Lumbar corset;Applied in sitting position Restrictions Weight Bearing Restrictions: No       Mobility Bed Mobility Overal bed mobility: Needs Assistance Bed Mobility: Rolling;Sit to Sidelying Rolling: Min assist       Sit to sidelying: Min assist General bed mobility comments: seated at edge of bed with nursing staff upon arrival. pt required min assist to elevate LE's up onto bed at end of session and position herself supine.   Transfers Overall transfer level: Needs assistance Equipment used: Rolling walker (2 wheeled) Transfers: Sit to/from Stand Sit to Stand: Min guard         General transfer comment: to/from rw. min guard for safety.     Balance Overall balance assessment: Needs assistance Sitting-balance support: Single extremity supported;Feet  supported Sitting balance-Leahy Scale: Fair     Standing balance support: Bilateral upper extremity supported;During functional activity Standing balance-Leahy Scale: Poor Standing balance comment: external support in standing                           ADL either performed or assessed with clinical judgement   ADL Overall ADL's : Needs assistance/impaired                         Toilet Transfer: Min guard;Ambulation;RW;BSC Toilet Transfer Details (indicate cue type and reason): simulated with up to recliner         Functional mobility during ADLs: Min guard;Rolling walker (in room ambulation) General ADL Comments: Pt received coming out of bathroom. ADL education reviewed. Pt cued to don back brace before further OOB activity.  pt transfered to recliner to sit up for a bit. Provided AE kit and educated on LB ADLs. Pt endorses needing some assist with ADLs and shower transfer at [place]: Awake/alert (quickly dozing off in recliner at end of session) Behavior During Therapy: [employer] for tasks assessed/performed;Anxious Overall Cognitive Status: No family/caregiver present to determine baseline cognitive functioning                                 General Comments: mentation appears improved from yesterday. Some difficulty dual tasking and problem solving. No family present to confirm baseline  Exercises     Shoulder Instructions       General Comments      Pertinent Vitals/ Pain       Pain Assessment: 0-10 Pain Score: 9  Pain Location: back Pain Descriptors / Indicators: Sore;Operative site guarding Pain Intervention(s): Monitored during session;Repositioned;Limited activity within patient's tolerance  Home Living                                          Prior Functioning/Environment              Frequency  Min 3X/week         Progress Toward Goals  OT Goals(current goals can now be found in the care plan section)  Progress towards OT goals: Progressing toward goals  Acute Rehab OT Goals Patient Stated Goal: None stated OT Goal Formulation: With patient Time For Goal Achievement: 03/24/20 Potential to Achieve Goals: Good ADL Goals Pt Will Perform Upper Body Dressing: with supervision;sitting Pt Will Perform Lower Body Dressing: with min guard assist;sit to/from stand;sitting/lateral leans;with adaptive equipment Pt Will Transfer to Toilet: with supervision;ambulating;regular height toilet Pt Will Perform Toileting - Clothing Manipulation and hygiene: with supervision;sit to/from stand;sitting/lateral leans Additional ADL Goal #1: Patient will recall 3/3 back precautions in order to safely participate in self care tasks.  Plan Discharge plan remains appropriate    Co-evaluation                 AM-PAC OT "6 Clicks" Daily Activity     Outcome Measure   Help from another person eating meals?: None Help from another person taking care of personal grooming?: None Help from another person toileting, which includes using toliet, bedpan, or urinal?: A Little Help from another person bathing (including washing, rinsing, drying)?: A Little Help from another person to put on and taking off regular upper body clothing?: A Little Help from another person to put on and taking off regular lower body clothing?: A Little 6 Click Score: 20    End of Session Equipment Utilized During Treatment: Back brace  OT Visit Diagnosis: Other abnormalities of gait and mobility (R26.89);Pain   Activity Tolerance Patient tolerated treatment well;Patient limited by fatigue   Patient Left in chair;with call bell/phone within reach   Nurse Communication Mobility status (pt up in chair, no chair alarm present)        Time: 0240-9735 OT Time Calculation (min): 22 min  Charges: OT General Charges $OT Visit: 1 Visit  OT Treatments $Self Care/Home Management : 8-22 mins  Nancy Bowers, OT Acute Rehabilitation Services Pager: [phone] Office: [phone]    Nancy Bowers 03/11/2020, 11:08 AM

## 2020-03-11 NOTE — Progress Notes (Signed)
Pt was very lethargy this morning with low BP and slurred speech. Pt will open her eyes if you speak but then cannot keep them open. I held Pt's pain medication because of her state. Around lunch she came around and was able to function normally with clear speech. Pt's husband came after lunch and within an hour of him coming the Pt was back to having slurred speech, lethargic, and BP was low. The NP was notified and a drug screen was performed. Will continue to monitor Pt. Rema Fendt, RN

## 2020-03-16 ENCOUNTER — Other Ambulatory Visit (HOSPITAL_COMMUNITY): Payer: Self-pay | Admitting: Psychiatry

## 2020-03-26 ENCOUNTER — Telehealth (HOSPITAL_COMMUNITY): Payer: Self-pay | Admitting: *Deleted

## 2020-03-26 NOTE — Telephone Encounter (Signed)
Pt called requesting refill of Adderall XR 15mg  qd. Last ordered on 02/26/20. Pt has an appointment upcoming on 04/29/20. Please review.

## 2020-03-29 ENCOUNTER — Other Ambulatory Visit (HOSPITAL_COMMUNITY): Payer: Self-pay | Admitting: Psychiatry

## 2020-03-29 DIAGNOSIS — F902 Attention-deficit hyperactivity disorder, combined type: Secondary | ICD-10-CM

## 2020-04-01 ENCOUNTER — Other Ambulatory Visit (HOSPITAL_COMMUNITY): Payer: Self-pay | Admitting: Psychiatry

## 2020-04-01 ENCOUNTER — Telehealth (HOSPITAL_COMMUNITY): Payer: Self-pay | Admitting: *Deleted

## 2020-04-01 DIAGNOSIS — F411 Generalized anxiety disorder: Secondary | ICD-10-CM

## 2020-04-01 MED ORDER — AMPHETAMINE-DEXTROAMPHET ER 15 MG PO CP24: 15.0000 mg | ORAL_CAPSULE | Freq: Every day | ORAL | 0 refills | Status: DC

## 2020-04-01 NOTE — Telephone Encounter (Signed)
Pt called requesting refill of the Adderall XR 15mg . Last ordered on 02/26/20. Pt has an upcoming appointment on 04/29/20. Please review.

## 2020-04-26 ENCOUNTER — Other Ambulatory Visit: Payer: Self-pay | Admitting: Internal Medicine

## 2020-04-26 DIAGNOSIS — Z1231 Encounter for screening mammogram for malignant neoplasm of breast: Secondary | ICD-10-CM

## 2020-04-27 ENCOUNTER — Ambulatory Visit
Admission: RE | Admit: 2020-04-27 | Discharge: 2020-04-27 | Disposition: A | Discharge status: Home / Self Care | Payer: Medicaid Other | Source: Ambulatory Visit | Attending: Internal Medicine | Admitting: Internal Medicine

## 2020-04-27 ENCOUNTER — Other Ambulatory Visit: Payer: Self-pay

## 2020-04-27 DIAGNOSIS — Z1231 Encounter for screening mammogram for malignant neoplasm of breast: Secondary | ICD-10-CM

## 2020-04-28 ENCOUNTER — Other Ambulatory Visit: Payer: Self-pay | Admitting: Internal Medicine

## 2020-04-28 DIAGNOSIS — R928 Other abnormal and inconclusive findings on diagnostic imaging of breast: Secondary | ICD-10-CM

## 2020-04-29 ENCOUNTER — Other Ambulatory Visit: Payer: Self-pay

## 2020-04-29 ENCOUNTER — Telehealth (HOSPITAL_COMMUNITY): Payer: Medicaid Other | Admitting: Psychiatry

## 2020-04-29 DIAGNOSIS — F319 Bipolar disorder, unspecified: Secondary | ICD-10-CM

## 2020-04-29 DIAGNOSIS — F900 Attention-deficit hyperactivity disorder, predominantly inattentive type: Secondary | ICD-10-CM

## 2020-04-29 DIAGNOSIS — F411 Generalized anxiety disorder: Secondary | ICD-10-CM

## 2020-04-29 MED ORDER — AMPHETAMINE-DEXTROAMPHETAMINE 20 MG PO TABS: 20.0000 mg | ORAL_TABLET | Freq: Two times a day (BID) | ORAL | 0 refills | Status: DC

## 2020-04-29 MED ORDER — AMITRIPTYLINE HCL 100 MG PO TABS: 100.0000 mg | ORAL_TABLET | Freq: Every day | ORAL | 2 refills | Status: DC

## 2020-04-29 MED ORDER — CLONAZEPAM 0.5 MG PO TABS: 0.5000 mg | ORAL_TABLET | Freq: Three times a day (TID) | ORAL | 2 refills | Status: DC | PRN

## 2020-04-29 MED ORDER — LAMOTRIGINE 100 MG PO TABS: 300.0000 mg | ORAL_TABLET | Freq: Every day | ORAL | 2 refills | Status: DC

## 2020-04-29 NOTE — Progress Notes (Signed)
Virtual Visit via Telephone Note  I connected with Nancy Bowers on 04/29/20 at  1:15 PM EDT by telephone and verified that I am speaking with the correct person using two identifiers.  Location: Patient: home Provider: office   I discussed the limitations, risks, security and privacy concerns of performing an evaluation and management service by telephone and the availability of in person appointments. I also discussed with the patient that there may be a patient responsible charge related to this service. The patient expressed understanding and agreed to proceed.   History of Present Illness: Nancy Bowers reports she is doing well. The surgery was painful but she is now starting to feel better. She was anxious, depressed and irritable. Now that the pain has resolved she is no longer irritable. Her depression and anxiety have significantly improved. She is feeling more like her old self. Nancy Bowers denies SI/HI. Her sleep is not good. She suspects it is related to her health anxieties. She has an upcoming breast appointment that is worrying her. She is able to fall asleep but wakes up at 2:30am every night and is unable to fall back asleep. Her main concern at this time is the Adderall XR is only effective for about 2 hrs. She would like to switch back to immediate release BID dosing.    Observations/Objective:  General Appearance: unable to assess  Eye Contact:  unable to assess  Speech:  Clear and Coherent and Normal Rate  Volume:  Normal  Mood:  Euthymic  Affect:  Full Range  Thought Process:  Goal Directed, Linear, and Descriptions of Associations: Intact  Orientation:  Full (Time, Place, and Person)  Thought Content:  Logical  Suicidal Thoughts:  No  Homicidal Thoughts:  No  Memory:  Immediate;   Good  Judgement:  Good  Insight:  Good  Psychomotor Activity: unable to assess  Concentration:  Concentration: Good  Recall:  Good  Fund of Knowledge:  Good  Language:  Good  Akathisia:   unable to assess  Handed:  Right  AIMS (if indicated):     Assets:  Communication Skills Desire for Improvement Financial Resources/Insurance Housing Intimacy Social Support Talents/Skills Transportation Vocational/Educational  ADL's:  unable to assess  Cognition:  WNL  Sleep:         Assessment and Plan:  MDD- recurrent, severe without psychotic features, GAD, ADHD  D/C Adderall XR  Start Adderall 20mg  po BID for ADHD  Klonopin 0.5mg  po TID prn anxiety  Amitriptyline 100mg  po qHS  Lamictal 300mg  po qD  Follow Up Instructions: In 4-8 weeks or sooner if needed   I discussed the assessment and treatment plan with the patient. The patient was provided an opportunity to ask questions and all were answered. The patient agreed with the plan and demonstrated an understanding of the instructions.   The patient was advised to call back or seek an in-person evaluation if the symptoms worsen or if the condition fails to improve as anticipated.  I provided 20 minutes of non-face-to-face time during this encounter.   , MD

## 2020-05-13 ENCOUNTER — Other Ambulatory Visit: Payer: Self-pay

## 2020-05-13 ENCOUNTER — Other Ambulatory Visit: Payer: Self-pay | Admitting: Internal Medicine

## 2020-05-13 ENCOUNTER — Ambulatory Visit
Admission: RE | Admit: 2020-05-13 | Discharge: 2020-05-13 | Disposition: A | Discharge status: Home / Self Care | Payer: Medicaid Other | Source: Ambulatory Visit | Attending: Internal Medicine | Admitting: Internal Medicine

## 2020-05-13 DIAGNOSIS — R928 Other abnormal and inconclusive findings on diagnostic imaging of breast: Secondary | ICD-10-CM

## 2020-05-14 ENCOUNTER — Encounter (HOSPITAL_COMMUNITY): Payer: Self-pay

## 2020-05-14 ENCOUNTER — Emergency Department (HOSPITAL_COMMUNITY)
Admission: EM | Admit: 2020-05-14 | Discharge: 2020-05-15 | Disposition: A | Discharge status: Home / Self Care | Payer: Medicaid Other | Attending: Emergency Medicine | Admitting: Emergency Medicine

## 2020-05-14 ENCOUNTER — Other Ambulatory Visit: Payer: Self-pay

## 2020-05-14 ENCOUNTER — Emergency Department (HOSPITAL_COMMUNITY): Payer: Medicaid Other

## 2020-05-14 DIAGNOSIS — M79605 Pain in left leg: Secondary | ICD-10-CM

## 2020-05-14 DIAGNOSIS — J449 Chronic obstructive pulmonary disease, unspecified: Secondary | ICD-10-CM | POA: Insufficient documentation

## 2020-05-14 DIAGNOSIS — M79662 Pain in left lower leg: Secondary | ICD-10-CM | POA: Diagnosis present

## 2020-05-14 DIAGNOSIS — F17210 Nicotine dependence, cigarettes, uncomplicated: Secondary | ICD-10-CM | POA: Diagnosis not present

## 2020-05-14 DIAGNOSIS — Z79899 Other long term (current) drug therapy: Secondary | ICD-10-CM | POA: Diagnosis not present

## 2020-05-14 NOTE — ED Triage Notes (Signed)
Pt presents with Left leg pain and numbness x2 days. Pt reports a recent lumbar back surgery in May

## 2020-05-14 NOTE — ED Notes (Signed)
Called pt name x4. No response from pt.

## 2020-05-14 NOTE — ED Notes (Signed)
Pt states she may have fell asleep and did not hear her name, pt is in the WR.

## 2020-05-14 NOTE — ED Notes (Signed)
Patient came to ask how much longer and it was found she had been DC at 1658 due to not answering X4. Status corrected. Patient reassessed and given emotional support.

## 2020-05-15 ENCOUNTER — Ambulatory Visit (HOSPITAL_COMMUNITY)
Admission: RE | Admit: 2020-05-15 | Discharge: 2020-05-15 | Disposition: A | Discharge status: Home / Self Care | Payer: Medicaid Other | Source: Ambulatory Visit | Attending: Emergency Medicine | Admitting: Emergency Medicine

## 2020-05-15 ENCOUNTER — Emergency Department (HOSPITAL_COMMUNITY)
Admission: EM | Admit: 2020-05-15 | Discharge: 2020-05-15 | Discharge status: Absent without leave | Payer: Medicaid Other

## 2020-05-15 DIAGNOSIS — M79609 Pain in unspecified limb: Secondary | ICD-10-CM

## 2020-05-15 DIAGNOSIS — M79662 Pain in left lower leg: Secondary | ICD-10-CM | POA: Diagnosis not present

## 2020-05-15 DIAGNOSIS — I7789 Other specified disorders of arteries and arterioles: Secondary | ICD-10-CM | POA: Diagnosis not present

## 2020-05-15 NOTE — ED Notes (Signed)
Pt tolerate ambulation well notified md
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
## 2020-05-15 NOTE — Progress Notes (Signed)
VASCULAR LAB    Left lower extremity venous duplex completed.    Preliminary report:  See CV proc for preliminary results.  Ambrose Wile, RVT 05/15/2020, 11:40 AM

## 2020-05-15 NOTE — Discharge Instructions (Addendum)
Your Xray of your knee today was reassuring. You have been scheduled for an ultrasound later this morning to assess for the possibility of a blood clot in your leg causing your symptoms. If negative, continue follow up with your primary doctor and neurosurgeon. Take your prescribed pain medications. You may return if symptoms persist or worsen.

## 2020-05-17 ENCOUNTER — Ambulatory Visit (INDEPENDENT_AMBULATORY_CARE_PROVIDER_SITE_OTHER): Payer: Medicaid Other

## 2020-05-17 ENCOUNTER — Other Ambulatory Visit: Payer: Self-pay

## 2020-05-17 ENCOUNTER — Ambulatory Visit: Payer: Medicaid Other | Admitting: Orthopedic Surgery

## 2020-05-17 DIAGNOSIS — Z23 Encounter for immunization: Secondary | ICD-10-CM | POA: Diagnosis not present

## 2020-05-17 NOTE — Progress Notes (Signed)
   Covid-19 Vaccination Clinic  Name:  Nancy Bowers    MRN: [id number] DOB: 07-Nov-1975  05/17/2020  [name] was observed post Covid-19 immunization for 15 minutes without incident. She was provided with Vaccine Information Sheet and instruction to access the V-Safe system.   Ms. Gains was instructed to call 911 with any severe reactions post vaccine: [name] Difficulty breathing  . Swelling of face and throat  . A fast heartbeat  . A bad rash all over body  . Dizziness and weakness   Immunizations Administered    Name Date Dose VIS Date Route   Pfizer COVID-19 Vaccine 05/17/2020 10:53 AM 0.3 mL 11/12/2018 Intramuscular   Manufacturer: [employer], [employer]   Lot: [id number]   NDC: [address]-4

## 2020-05-20 ENCOUNTER — Ambulatory Visit (INDEPENDENT_AMBULATORY_CARE_PROVIDER_SITE_OTHER): Payer: Medicaid Other | Admitting: Physician Assistant

## 2020-05-20 ENCOUNTER — Encounter: Payer: Self-pay | Admitting: Orthopedic Surgery

## 2020-05-20 VITALS — Ht 65.0 in | Wt 145.0 lb

## 2020-05-20 DIAGNOSIS — M79671 Pain in right foot: Secondary | ICD-10-CM | POA: Diagnosis not present

## 2020-05-20 DIAGNOSIS — M79672 Pain in left foot: Secondary | ICD-10-CM
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
## 2020-06-03 ENCOUNTER — Other Ambulatory Visit: Payer: Self-pay

## 2020-06-03 ENCOUNTER — Telehealth (HOSPITAL_COMMUNITY): Payer: Medicaid Other | Admitting: Psychiatry

## 2020-06-03 DIAGNOSIS — F411 Generalized anxiety disorder: Secondary | ICD-10-CM

## 2020-06-03 DIAGNOSIS — F319 Bipolar disorder, unspecified: Secondary | ICD-10-CM

## 2020-06-03 DIAGNOSIS — F900 Attention-deficit hyperactivity disorder, predominantly inattentive type: Secondary | ICD-10-CM

## 2020-06-03 NOTE — Progress Notes (Signed)
Virtual Visit via Telephone Note  I connected with Nancy Bowers on 06/03/20 at  1:00 PM EDT by telephone and verified that I am speaking with the correct person using two identifiers.  Location: Patient: at her friend's house Provider: office   I discussed the limitations, risks, security and privacy concerns of performing an evaluation and management service by telephone and the availability of in person appointments. I also discussed with the patient that there may be a patient responsible charge related to this service. The patient expressed understanding and agreed to proceed.   History of Present Illness: Nancy Bowers states she is doing ok. She is slowly recovering well from her back surgery. Her BP has been elevated. She is not sure if it is due to pain or other reasons. She has been monitoring it at home and there is no change in BP on days she doesn't take the Adderall. Her depression and anxiety are stable. Nancy Bowers is sleeping good (6-9 hrs/night). She denies anhedonia. She denies SI/HI. Nancy Bowers is eating at least 2 meals/day which is a big improvement. Pt denies recent manic and hypomanic symptoms including periods of decreased need for sleep, increased energy, mood lability, impulsivity, FOI, and excessive spending. The Adderall is effective in keeping her focused and it lasts for about 5-6 hrs. She wonders if increasing the dose would help to focus even better.   Nancy Bowers, her husband, states her depression is better. She has a few bad days but they are not as intense as in the past. Nancy Bowers has not seen any manic or hypomanic like symptoms since our last visit. She is not focusing well.    Observations/Objective:  General Appearance: unable to assess  Eye Contact:  unable to assess  Speech:  Clear and Coherent and Normal Rate  Volume:  Normal  Mood:  Euthymic  Affect:  Full Range  Thought Process:  Goal Directed and Descriptions of Associations: Intact  Orientation:  Full (Time, Place, and  Person)  Thought Content:  Logical  Suicidal Thoughts:  No  Homicidal Thoughts:  No  Memory:  Immediate;   Good  Judgement:  Good  Insight:  Good  Psychomotor Activity: unable to assess  Concentration:  Concentration: Good  Recall:  Good  Fund of Knowledge:  Good  Language:  Good  Akathisia:  unable to assess  Handed:  Right  AIMS (if indicated):     Assets:  Communication Skills Desire for Improvement Financial Resources/Insurance Housing Intimacy Resilience Social Support Talents/Skills Transportation Vocational/Educational  ADL's:  unable to assess  Cognition:  WNL  Sleep:         Assessment and Plan: ADHD- inattentive type; Bipolar I d/o- current episode depressed; GAD  Klonopin 0.5mg  po TID prn anxiety  Amitriptyline 100mg  po qHS  Lamictal 300mg  po qD  Adderall 20mg  po BID  Her BP is elevated and she is working with her PCP- appt at the end of the this month   Follow Up Instructions: In 2-3 months or sooner if needed   I discussed the assessment and treatment plan with the patient. The patient was provided an opportunity to ask questions and all were answered. The patient agreed with the plan and demonstrated an understanding of the instructions.   The patient was advised to call back or seek an in-person evaluation if the symptoms worsen or if the condition fails to improve as anticipated.  I provided 20 minutes of non-face-to-face time during this encounter.   , MD

## 2020-06-17 ENCOUNTER — Ambulatory Visit: Payer: Self-pay

## 2020-06-17 ENCOUNTER — Ambulatory Visit (INDEPENDENT_AMBULATORY_CARE_PROVIDER_SITE_OTHER): Payer: Medicaid Other | Admitting: Orthopedic Surgery

## 2020-06-17 ENCOUNTER — Other Ambulatory Visit: Payer: Self-pay

## 2020-06-17 ENCOUNTER — Encounter: Payer: Self-pay | Admitting: Orthopedic Surgery

## 2020-06-17 DIAGNOSIS — M79672 Pain in left foot: Secondary | ICD-10-CM

## 2020-06-17 DIAGNOSIS — R531 Weakness: Secondary | ICD-10-CM

## 2020-06-17 DIAGNOSIS — M79671 Pain in right foot: Secondary | ICD-10-CM | POA: Diagnosis not present

## 2020-06-18 ENCOUNTER — Other Ambulatory Visit (HOSPITAL_COMMUNITY): Payer: Self-pay | Admitting: Psychiatry

## 2020-06-21 ENCOUNTER — Encounter: Payer: Self-pay | Admitting: Orthopedic Surgery

## 2020-06-21 ENCOUNTER — Ambulatory Visit (INDEPENDENT_AMBULATORY_CARE_PROVIDER_SITE_OTHER): Payer: Medicaid Other

## 2020-06-21 DIAGNOSIS — Z23 Encounter for immunization: Secondary | ICD-10-CM | POA: Diagnosis not present
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
## 2020-06-22 ENCOUNTER — Other Ambulatory Visit (HOSPITAL_COMMUNITY): Payer: Self-pay | Admitting: Psychiatry

## 2020-06-22 ENCOUNTER — Telehealth (HOSPITAL_COMMUNITY): Payer: Self-pay | Admitting: *Deleted

## 2020-06-22 DIAGNOSIS — F900 Attention-deficit hyperactivity disorder, predominantly inattentive type: Secondary | ICD-10-CM

## 2020-06-22 NOTE — Telephone Encounter (Signed)
Patient called requesting refill of Adderall.  She was last seen 04/29/20 but no refills were authorized.  She will run out 10/12. Please review.

## 2020-07-15 ENCOUNTER — Other Ambulatory Visit (HOSPITAL_COMMUNITY): Payer: Self-pay | Admitting: Psychiatry

## 2020-07-15 DIAGNOSIS — F900 Attention-deficit hyperactivity disorder, predominantly inattentive type: Secondary | ICD-10-CM

## 2020-08-02 ENCOUNTER — Telehealth (HOSPITAL_COMMUNITY): Payer: Self-pay

## 2020-08-02 NOTE — Telephone Encounter (Signed)
Received a fax from [employer] at [facility] for a patient referral. It states that they "Need a letter documenting Klonopin as medically necessary or replace with another medication since Klonopin combined with opioid increases risk for respiratory depression, coma, and death." I can fax you what I received if you would like me to. Please review and advise. Thank you.

## 2020-08-03 NOTE — Telephone Encounter (Signed)
Patient called regarding her paperwork. I called patient to inform her that I have sent doctor a message and have faxed patient's paperwork to the doctor. I'm waiting for a response back from the doctor. Patient did not pick up - LVM

## 2020-08-04 ENCOUNTER — Telehealth (HOSPITAL_COMMUNITY): Payer: Self-pay

## 2020-08-04 NOTE — Telephone Encounter (Signed)
Patient called again requesting the letter regarding her Clonazepam. Please advise

## 2020-08-06 ENCOUNTER — Encounter (HOSPITAL_COMMUNITY): Payer: Self-pay | Admitting: Psychiatry

## 2020-08-06 NOTE — Telephone Encounter (Signed)
done

## 2020-08-06 NOTE — Telephone Encounter (Signed)
I don't see the form. I got something from [employer] but it won't open. Please fax it to me again.

## 2020-08-06 NOTE — Progress Notes (Signed)
Letter for pain clinic regarding Klonopin

## 2020-08-07 ENCOUNTER — Encounter (HOSPITAL_COMMUNITY): Payer: Self-pay | Admitting: *Deleted

## 2020-08-07 ENCOUNTER — Emergency Department (HOSPITAL_COMMUNITY)
Admission: EM | Admit: 2020-08-07 | Discharge: 2020-08-08 | Disposition: A | Discharge status: Home / Self Care | Payer: Medicaid Other | Attending: Emergency Medicine | Admitting: Emergency Medicine

## 2020-08-07 ENCOUNTER — Other Ambulatory Visit: Payer: Self-pay

## 2020-08-07 DIAGNOSIS — W260XXA Contact with knife, initial encounter: Secondary | ICD-10-CM | POA: Diagnosis not present

## 2020-08-07 DIAGNOSIS — F17210 Nicotine dependence, cigarettes, uncomplicated: Secondary | ICD-10-CM | POA: Insufficient documentation

## 2020-08-07 DIAGNOSIS — J449 Chronic obstructive pulmonary disease, unspecified: Secondary | ICD-10-CM | POA: Insufficient documentation

## 2020-08-07 DIAGNOSIS — S61412A Laceration without foreign body of left hand, initial encounter: Secondary | ICD-10-CM

## 2020-08-07 DIAGNOSIS — Z23 Encounter for immunization: Secondary | ICD-10-CM | POA: Diagnosis not present
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
## 2020-08-08 MED ORDER — LIDOCAINE HCL (PF) 1 % IJ SOLN
5.0000 mL | Freq: Once | INTRAMUSCULAR | Status: AC
  Administered 2020-08-08: 5 mL
  Filled 2020-08-08: qty 5

## 2020-08-08 MED ORDER — BACITRACIN ZINC 500 UNIT/GM EX OINT
TOPICAL_OINTMENT | Freq: Two times a day (BID) | CUTANEOUS | Status: DC
  Filled 2020-08-08: qty 0.9

## 2020-08-08 MED ORDER — TETANUS-DIPHTH-ACELL PERTUSSIS 5-2.5-18.5 LF-MCG/0.5 IM SUSY
0.5000 mL | PREFILLED_SYRINGE | Freq: Once | INTRAMUSCULAR | Status: AC
  Administered 2020-08-08: 0.5 mL via INTRAMUSCULAR
  Filled 2020-08-08: qty 0.5
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
## 2020-08-08 NOTE — ED Notes (Signed)
Bacitracin applied to repaired wound; dressed with Telfa pad and Kling. Extra sent home with patient.

## 2020-08-08 NOTE — Discharge Instructions (Addendum)
Follow up with your doctor in 7-10 days for suture removal. Go sooner with any sign of infection - fever, increasing pain or redness, drainage from the wound.   Tylenol and/or ibuprofen for any discomfort.

## 2020-08-10 NOTE — Telephone Encounter (Signed)
There was not a form. It was just the letter that Dr. Michae Kava had done and sent/faxed

## 2020-08-13 ENCOUNTER — Other Ambulatory Visit (HOSPITAL_COMMUNITY): Payer: Self-pay | Admitting: Psychiatry

## 2020-08-13 DIAGNOSIS — F411 Generalized anxiety disorder: Secondary | ICD-10-CM

## 2020-08-17 ENCOUNTER — Encounter: Payer: Self-pay | Admitting: Orthopedic Surgery

## 2020-08-17 ENCOUNTER — Ambulatory Visit (INDEPENDENT_AMBULATORY_CARE_PROVIDER_SITE_OTHER): Payer: Medicaid Other | Admitting: Orthopedic Surgery

## 2020-08-17 DIAGNOSIS — M79671 Pain in right foot: Secondary | ICD-10-CM | POA: Diagnosis not present

## 2020-08-17 DIAGNOSIS — M79672 Pain in left foot: Secondary | ICD-10-CM | POA: Diagnosis not present
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
## 2020-08-24 ENCOUNTER — Telehealth (HOSPITAL_COMMUNITY): Payer: Self-pay | Admitting: *Deleted

## 2020-08-24 ENCOUNTER — Other Ambulatory Visit (HOSPITAL_COMMUNITY): Payer: Self-pay | Admitting: Psychiatry

## 2020-08-24 DIAGNOSIS — F900 Attention-deficit hyperactivity disorder, predominantly inattentive type: Secondary | ICD-10-CM

## 2020-08-24 NOTE — Telephone Encounter (Signed)
Patient needs a refill of Adderall.  She needs a new appointment. I will call and advise her of this. Please review.

## 2020-08-26 ENCOUNTER — Telehealth (HOSPITAL_COMMUNITY): Payer: Medicaid Other | Admitting: Psychiatry

## 2020-08-26 ENCOUNTER — Other Ambulatory Visit: Payer: Self-pay

## 2020-08-26 DIAGNOSIS — F319 Bipolar disorder, unspecified: Secondary | ICD-10-CM

## 2020-08-26 DIAGNOSIS — F411 Generalized anxiety disorder: Secondary | ICD-10-CM

## 2020-08-26 DIAGNOSIS — F900 Attention-deficit hyperactivity disorder, predominantly inattentive type: Secondary | ICD-10-CM

## 2020-08-26 MED ORDER — AMPHETAMINE-DEXTROAMPHETAMINE 20 MG PO TABS: 20.0000 mg | ORAL_TABLET | Freq: Two times a day (BID) | ORAL | 0 refills | Status: DC

## 2020-08-26 MED ORDER — DESVENLAFAXINE SUCCINATE ER 100 MG PO TB24: 100.0000 mg | ORAL_TABLET | Freq: Every day | ORAL | 0 refills | Status: DC

## 2020-08-26 MED ORDER — LAMOTRIGINE 100 MG PO TABS: 300.0000 mg | ORAL_TABLET | Freq: Every day | ORAL | 2 refills | Status: DC

## 2020-08-26 MED ORDER — CLONAZEPAM 0.5 MG PO TABS: 0.5000 mg | ORAL_TABLET | Freq: Three times a day (TID) | ORAL | 2 refills | Status: DC | PRN

## 2020-08-26 NOTE — Progress Notes (Unsigned)
Virtual Visit via Telephone Note  I connected with Nancy Bowers on 08/26/20 at 11:30 AM EST by telephone and verified that I am speaking with the correct person using two identifiers.  Location: Patient: home Provider: office   I discussed the limitations, risks, security and privacy concerns of performing an evaluation and management service by telephone and the availability of in person appointments. I also discussed with the patient that there may be a patient responsible charge related to this service. The patient expressed understanding and agreed to proceed.   History of Present Illness: Nancy Bowers shares that overall her depression has been manageable. She has moments where her depression and anxiety spike due to stressors. Nancy Bowers is sleeping 8 hrs/night. She stays up late playing games on her phone in hopes of winning money. A big stressor is her mother. When she gets upset with her mother then Nancy Bowers's anxiety and irritability increase and lasts up to 1 hr. Things are better since moving out her mother's house. She is denying other symptoms of mania and hypomania. Nancy Bowers denies SI/HI. Adderall remains effective. Her BP was high while staying with her mother but has improved since moving out. Nancy Bowers is taking Klonopin TID to keep her anxiety under control. She has a new pain doctor who wants to monitor her Klonopin use.    Observations/Objective:   Assessment and Plan:   Follow Up Instructions: In 2-3 months or sooner if needed   I discussed the assessment and treatment plan with the patient. The patient was provided an opportunity to ask questions and all were answered. The patient agreed with the plan and demonstrated an understanding of the instructions.   The patient was advised to call back or seek an in-person evaluation if the symptoms worsen or if the condition fails to improve as anticipated.  I provided 15 minutes of non-face-to-face time during this encounter.   Oletta Darter, MD

## 2020-08-26 NOTE — Telephone Encounter (Signed)
done

## 2020-10-21 ENCOUNTER — Telehealth (HOSPITAL_COMMUNITY): Payer: Self-pay | Admitting: *Deleted

## 2020-10-21 NOTE — Telephone Encounter (Signed)
I can not provide a letter to the patient regarding mistakenly taking an opiate. I do not prescribe them to her and I have never advised her on how or when to take them.

## 2020-10-21 NOTE — Telephone Encounter (Signed)
Patient called and stated she had taken one of her husbands Percocet and that she wanted a letter from Dr Michae Kava stating that she had done this by mistake.  She is to be drug tested 10/27/2020 by her pain management clinic and is worried they wont fill her meds if she tests positive for drugs.  She said Dr Michae Kava had done this for her before.  I reviewed the record and saw that something had been written regarding Klonopin in the past. Please review. Patient to call back tomorrow regarding this letter if one is written.

## 2020-10-25 ENCOUNTER — Telehealth (HOSPITAL_COMMUNITY): Payer: Self-pay | Admitting: *Deleted

## 2020-10-25 NOTE — Telephone Encounter (Signed)
I can understand that.

## 2020-10-25 NOTE — Telephone Encounter (Signed)
Patient has called several times requesting that we call pain clinic in effort to allow them to manage pain.  Returned call and did not reveal patients name or other information.  The Pain Clinic knew who patient was.  I asked them to send [place] a ROI before giving them further information.. That office stated in general that they would need a letter from you stating.Marland KitchenMarland Kitchen"Patient Klonopin is being monitored and will be tapered.  Patient will avoid taking non prescribed medicine." Just wanted you to know.  Will keep you abreast.

## 2020-10-25 NOTE — Telephone Encounter (Signed)
Received consent to exchange info with patients pain clinic. Patient continues to call about letter. FYI.  See prior telephone message.

## 2020-10-26 ENCOUNTER — Telehealth (HOSPITAL_COMMUNITY): Payer: Self-pay | Admitting: *Deleted

## 2020-10-26 NOTE — Telephone Encounter (Signed)
Received call from [facility].  They are requesting a letter be sent to Dr. Metta Clines about taper of Klonopin and Dr.Crisp would like to speak with you.  [phone].  Office: [phone].  Her appt with pain clinic is 10/27/20.

## 2020-10-27 ENCOUNTER — Telehealth (HOSPITAL_COMMUNITY): Payer: Self-pay | Admitting: *Deleted

## 2020-10-27 NOTE — Telephone Encounter (Signed)
Pt called asking that you call her today regarding pain clinic situation. FYI.

## 2020-10-27 NOTE — Telephone Encounter (Signed)
Received request for information to Dr Metta Clines regarding letter addressing Klonopin taper and used of unprescribed medication.  Request faxed to Dr Michae Kava.

## 2020-10-28 ENCOUNTER — Telehealth (HOSPITAL_COMMUNITY): Payer: Self-pay | Admitting: *Deleted

## 2020-10-28 ENCOUNTER — Telehealth (HOSPITAL_COMMUNITY): Payer: Self-pay | Admitting: Psychiatry

## 2020-10-28 NOTE — Telephone Encounter (Signed)
Placed call to patient and left message regarding Dr. Lemar Lofty letter to the pain clinic.

## 2020-10-28 NOTE — Telephone Encounter (Signed)
I called and left a voice message asking for return phone call.

## 2020-11-11 ENCOUNTER — Telehealth (INDEPENDENT_AMBULATORY_CARE_PROVIDER_SITE_OTHER): Payer: Medicaid Other | Admitting: Psychiatry

## 2020-11-11 ENCOUNTER — Other Ambulatory Visit: Payer: Self-pay

## 2020-11-11 DIAGNOSIS — F411 Generalized anxiety disorder: Secondary | ICD-10-CM | POA: Diagnosis not present

## 2020-11-11 DIAGNOSIS — F900 Attention-deficit hyperactivity disorder, predominantly inattentive type: Secondary | ICD-10-CM | POA: Diagnosis not present

## 2020-11-11 DIAGNOSIS — F319 Bipolar disorder, unspecified: Secondary | ICD-10-CM

## 2020-11-11 MED ORDER — AMPHETAMINE-DEXTROAMPHETAMINE 20 MG PO TABS: 20.0000 mg | ORAL_TABLET | Freq: Two times a day (BID) | ORAL | 0 refills | Status: DC

## 2020-11-11 MED ORDER — CLONIDINE HCL 0.1 MG PO TABS: 0.1000 mg | ORAL_TABLET | Freq: Two times a day (BID) | ORAL | 1 refills | Status: DC | PRN

## 2020-11-11 MED ORDER — DESVENLAFAXINE SUCCINATE ER 100 MG PO TB24: 100.0000 mg | ORAL_TABLET | Freq: Every day | ORAL | 0 refills | Status: DC

## 2020-11-11 MED ORDER — LAMOTRIGINE 100 MG PO TABS: 300.0000 mg | ORAL_TABLET | Freq: Every day | ORAL | 2 refills | Status: DC

## 2020-11-11 MED ORDER — CLONAZEPAM 0.5 MG PO TABS: ORAL_TABLET | ORAL | 0 refills | Status: DC

## 2020-11-11 NOTE — Progress Notes (Signed)
Virtual Visit via Telephone Note  I connected with Nancy Bowers on 11/11/20 at  1:30 PM EST by telephone and verified that I am speaking with the correct person using two identifiers.  Location: Patient: home Provider: office   I discussed the limitations, risks, security and privacy concerns of performing an evaluation and management service by telephone and the availability of in person appointments. I also discussed with the patient that there may be a patient responsible charge related to this service. The patient expressed understanding and agreed to proceed.   History of Present Illness: Nancy Bowers shares that her pain doctor has instructed her to taper off and stop the Klonopin. He does not feel comfortable giving her a pain med with benzo use. Her anxiety has been up and down. She has had a few stress induced panic attacks. Nancy Bowers is worried about what will help her anxiety when she is gets off of Klonopin. She is concerned that her depression will worsen with more panic attacks. Overall her depression is stable for now. Her sleep is broken. When her pain level is high she only sleeps for a few hours at a time. There are days where she will stay in pain due to pain and depression. Her menstrual cycle is off this month and she is about 6 weeks late. Nancy Bowers has not taken a home pregnancy test yet due to cost. She believes she is starting menopause because she is not having the usual pregnancy symptoms.  There are also days where she is restless where she will do some chores and play on her phone. She denies SI/HI. Her ADHD is responding well to Adderall when she is in a lot of pain.  She denies SI/HI.   Observations/Objective:   General Appearance: unable to assess  Eye Contact:  unable to assess  Speech:  Clear and Coherent and Normal Rate  Volume:  Normal  Mood:  Anxious and Depressed  Affect:  Full Range  Thought Process:  Goal Directed, Linear and Descriptions of Associations: Intact   Orientation:  Full (Time, Place, and Person)  Thought Content:  Rumination  Suicidal Thoughts:  No  Homicidal Thoughts:  No  Memory:  Immediate;   Good  Judgement:  Good  Insight:  Good  Psychomotor Activity: unable to assess  Concentration:  Concentration: Good  Recall:  Good  Fund of Knowledge:  Good  Language:  Good  Akathisia:  unable to assess  Handed:  Right  AIMS (if indicated):     Assets:  Communication Skills Desire for Improvement Financial Resources/Insurance Housing Intimacy Social Support Talents/Skills Transportation Vocational/Educational  ADL's:  unable to assess  Cognition:  WNL  Sleep:        Assessment and Plan: 1. Attention deficit hyperactivity disorder (ADHD), predominantly inattentive type - amphetamine-dextroamphetamine (ADDERALL) 20 MG tablet; Take 1 tablet (20 mg total) by mouth 2 (two) times daily.  Dispense: 60 tablet; Refill: 0 - amphetamine-dextroamphetamine (ADDERALL) 20 MG tablet; Take 1 tablet (20 mg total) by mouth 2 (two) times daily.  Dispense: 60 tablet; Refill: 0 - amphetamine-dextroamphetamine (ADDERALL) 20 MG tablet; Take 1 tablet (20 mg total) by mouth 2 (two) times daily.  Dispense: 60 tablet; Refill: 0  2. Bipolar 1 disorder (HCC) - desvenlafaxine (PRISTIQ) 100 MG 24 hr tablet; Take 1 tablet (100 mg total) by mouth daily.  Dispense: 90 tablet; Refill: 0 - clonazePAM (KLONOPIN) 0.5 MG tablet; Take 1 tablet (0.5 mg total) by mouth 2 (two) times daily as needed for  3 days for anxiety, THEN 1 tablet (0.5 mg total) daily for 4 days.  Dispense: 10 tablet; Refill: 0 - lamoTRIgine (LAMICTAL) 100 MG tablet; Take 3 tablets (300 mg total) by mouth daily.  Dispense: 90 tablet; Refill: 2  3. GAD (generalized anxiety disorder) - desvenlafaxine (PRISTIQ) 100 MG 24 hr tablet; Take 1 tablet (100 mg total) by mouth daily.  Dispense: 90 tablet; Refill: 0 - start cloNIDine (CATAPRES) 0.1 MG tablet; Take 1 tablet (0.1 mg total) by mouth 2 (two)  times daily as needed (anxiety).  Dispense: 60 tablet; Refill: 1 - taper off per pain doctor request-- clonazePAM (KLONOPIN) 0.5 MG tablet; Take 1 tablet (0.5 mg total) by mouth 2 (two) times daily as needed for 3 days for anxiety, THEN 1 tablet (0.5 mg total) daily for 4 days.  Dispense: 10 tablet; Refill: 0  - Nancy Bowers is very concerned about managing her anxiety after stopping Klonopin but is willing to work with MD   Follow Up Instructions: In 4-6 weeks or sooner if needed   I discussed the assessment and treatment plan with the patient. The patient was provided an opportunity to ask questions and all were answered. The patient agreed with the plan and demonstrated an understanding of the instructions.   The patient was advised to call back or seek an in-person evaluation if the symptoms worsen or if the condition fails to improve as anticipated.  I provided 25 minutes of non-face-to-face time during this encounter.   Oletta Darter, MD

## 2020-11-15 ENCOUNTER — Other Ambulatory Visit: Payer: Medicaid Other

## 2020-11-17 NOTE — Telephone Encounter (Signed)
opned in error

## 2020-11-26 ENCOUNTER — Ambulatory Visit: Payer: Medicaid Other

## 2020-11-26 ENCOUNTER — Ambulatory Visit
Admission: RE | Admit: 2020-11-26 | Discharge: 2020-11-26 | Disposition: A | Discharge status: Home / Self Care | Payer: Medicaid Other | Source: Ambulatory Visit | Attending: Internal Medicine | Admitting: Internal Medicine

## 2020-11-26 ENCOUNTER — Other Ambulatory Visit: Payer: Self-pay | Admitting: Internal Medicine

## 2020-11-26 ENCOUNTER — Other Ambulatory Visit: Payer: Self-pay

## 2020-11-26 DIAGNOSIS — R928 Other abnormal and inconclusive findings on diagnostic imaging of breast: Secondary | ICD-10-CM

## 2020-12-23 ENCOUNTER — Telehealth (HOSPITAL_COMMUNITY): Payer: Medicaid Other | Admitting: Psychiatry

## 2020-12-23 ENCOUNTER — Other Ambulatory Visit: Payer: Self-pay

## 2020-12-23 ENCOUNTER — Telehealth (HOSPITAL_COMMUNITY): Payer: Self-pay | Admitting: Psychiatry

## 2020-12-23 NOTE — Telephone Encounter (Signed)
I called the patient at our scheduled appointment time. There was no answer. I left a voice message for patient to call the clinic back at their convinence.   

## 2021-01-17 ENCOUNTER — Encounter: Payer: Self-pay | Admitting: Orthopedic Surgery

## 2021-01-17 ENCOUNTER — Ambulatory Visit: Payer: Medicaid Other | Admitting: Orthopedic Surgery

## 2021-01-17 ENCOUNTER — Ambulatory Visit (INDEPENDENT_AMBULATORY_CARE_PROVIDER_SITE_OTHER): Payer: Medicaid Other | Admitting: Orthopedic Surgery

## 2021-01-17 DIAGNOSIS — L97521 Non-pressure chronic ulcer of other part of left foot limited to breakdown of skin: Secondary | ICD-10-CM | POA: Diagnosis not present

## 2021-01-17 DIAGNOSIS — M79672 Pain in left foot: Secondary | ICD-10-CM

## 2021-01-17 DIAGNOSIS — M79671 Pain in right foot: Secondary | ICD-10-CM
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
## 2021-02-16 ENCOUNTER — Telehealth (HOSPITAL_COMMUNITY): Payer: Self-pay | Admitting: *Deleted

## 2021-02-16 NOTE — Telephone Encounter (Signed)
Pt called requesting refills on all meds. Pt was no show for last appointment on 12/23/20. Last visit was on 11/11/20. Pt does have an upcoming appointment on 02/24/21. Please review. Thanks.

## 2021-02-17 ENCOUNTER — Telehealth (HOSPITAL_COMMUNITY): Payer: Self-pay | Admitting: *Deleted

## 2021-02-17 ENCOUNTER — Other Ambulatory Visit (HOSPITAL_COMMUNITY): Payer: Self-pay | Admitting: Psychiatry

## 2021-02-17 DIAGNOSIS — F900 Attention-deficit hyperactivity disorder, predominantly inattentive type: Secondary | ICD-10-CM

## 2021-02-17 DIAGNOSIS — F411 Generalized anxiety disorder: Secondary | ICD-10-CM

## 2021-02-17 DIAGNOSIS — F319 Bipolar disorder, unspecified: Secondary | ICD-10-CM

## 2021-02-17 MED ORDER — LAMOTRIGINE 100 MG PO TABS: 300.0000 mg | ORAL_TABLET | Freq: Every day | ORAL | 0 refills | Status: DC

## 2021-02-17 MED ORDER — DESVENLAFAXINE SUCCINATE ER 100 MG PO TB24: 100.0000 mg | ORAL_TABLET | Freq: Every day | ORAL | 0 refills | Status: DC

## 2021-02-17 MED ORDER — AMPHETAMINE-DEXTROAMPHETAMINE 20 MG PO TABS: 20.0000 mg | ORAL_TABLET | Freq: Two times a day (BID) | ORAL | 0 refills | Status: DC

## 2021-02-17 MED ORDER — CLONIDINE HCL 0.1 MG PO TABS: 0.1000 mg | ORAL_TABLET | Freq: Two times a day (BID) | ORAL | 0 refills | Status: DC | PRN

## 2021-02-17 NOTE — Telephone Encounter (Signed)
Sent in refills for 10 days to bridge to appointment

## 2021-02-17 NOTE — Telephone Encounter (Signed)
Pt called requesting refilll of Adderall. Pt has an appointment upcoming with Dr. Michae Kava on 02/24/21. Please review. Thanks.

## 2021-02-17 NOTE — Telephone Encounter (Signed)
done

## 2021-02-19 ENCOUNTER — Other Ambulatory Visit: Payer: Self-pay

## 2021-02-19 ENCOUNTER — Ambulatory Visit (INDEPENDENT_AMBULATORY_CARE_PROVIDER_SITE_OTHER): Payer: Medicaid Other

## 2021-02-19 DIAGNOSIS — Z23 Encounter for immunization: Secondary | ICD-10-CM

## 2021-02-19 NOTE — Progress Notes (Signed)
   Covid-19 Vaccination Clinic  Name:  Nancy Bowers    MRN: [id number] DOB: [birth year]  02/19/2021  [name] was observed post Covid-19 immunization for 15 MINUTES without incident. She was provided with Vaccine Information Sheet and instruction to access the V-Safe system.   [name] was instructed to call 911 with any severe reactions post vaccine: [name] Difficulty breathing  . Swelling of face and throat  . A fast heartbeat  . A bad rash all over body  . Dizziness and weakness   Immunizations Administered    Name Date Dose VIS Date Route   PFIZER Comrnaty(Gray TOP) Covid-19 Vaccine 02/19/2021 11:25 AM 0.3 mL 08/26/2020 Intramuscular   Manufacturer: [employer], [employer]   Lot: [id number]   NDC: [phone]

## 2021-02-24 ENCOUNTER — Telehealth (INDEPENDENT_AMBULATORY_CARE_PROVIDER_SITE_OTHER): Payer: Medicaid Other | Admitting: Psychiatry

## 2021-02-24 ENCOUNTER — Other Ambulatory Visit (HOSPITAL_COMMUNITY): Payer: Self-pay | Admitting: Psychiatry

## 2021-02-24 ENCOUNTER — Other Ambulatory Visit: Payer: Self-pay

## 2021-02-24 ENCOUNTER — Telehealth (HOSPITAL_COMMUNITY): Payer: Self-pay | Admitting: *Deleted

## 2021-02-24 DIAGNOSIS — F900 Attention-deficit hyperactivity disorder, predominantly inattentive type: Secondary | ICD-10-CM

## 2021-02-24 DIAGNOSIS — F411 Generalized anxiety disorder: Secondary | ICD-10-CM

## 2021-02-24 DIAGNOSIS — F319 Bipolar disorder, unspecified: Secondary | ICD-10-CM

## 2021-02-24 MED ORDER — AMPHETAMINE-DEXTROAMPHETAMINE 30 MG PO TABS: 30.0000 mg | ORAL_TABLET | Freq: Two times a day (BID) | ORAL | 0 refills | Status: DC

## 2021-02-24 MED ORDER — LAMOTRIGINE 100 MG PO TABS: 300.0000 mg | ORAL_TABLET | Freq: Every day | ORAL | 0 refills | Status: DC

## 2021-02-24 MED ORDER — DESVENLAFAXINE SUCCINATE ER 100 MG PO TB24: 100.0000 mg | ORAL_TABLET | Freq: Every day | ORAL | 0 refills | Status: AC

## 2021-02-24 MED ORDER — AMPHETAMINE-DEXTROAMPHETAMINE 20 MG PO TABS: 20.0000 mg | ORAL_TABLET | Freq: Two times a day (BID) | ORAL | 0 refills | Status: DC

## 2021-02-24 NOTE — Telephone Encounter (Signed)
Patient's pharmacy called and asked for verification of Adderall doses.  Two scripts for 30mg  and one for 20mg . Please clarify if this is correct.  Pharmacy is . Thanks.

## 2021-02-24 NOTE — Progress Notes (Signed)
Virtual Visit via Telephone Note  I connected with Nancy Bowers on 02/24/21 at  1:20 PM EDT by telephone and verified that I am speaking with the correct person using two identifiers.  Location: Patient: home Provider: office   I discussed the limitations, risks, security and privacy concerns of performing an evaluation and management service by telephone and the availability of in person appointments. I also discussed with the patient that there may be a patient responsible charge related to this service. The patient expressed understanding and agreed to proceed.   History of Present Illness: "Horrible". Nancy Bowers is having panic attacks and her ADHD is not well controlled. She is having a lot of pain and she is going to a new pain doctor and it is helping some. Nancy Bowers was told she will probably need another surgery. She has been depressed about 3-4 days out of the last 14 days. She spends most of her time in her room due to pain. Her sleep schedule is off. She goes to bed at 3am and wakes up at 9am. She is always tired. Her appetite is increased and she is gaining weight. She has a lot of negative self talk. Nancy Bowers denies SI/HI.  Her concentration is poor even with Adderall BID.  She is unable to play games or focus on tv. Her brain feels overloaded. She denies any manic or hypomanic like symptoms and episodes. Her anxiety is up due to stress. She is no longer on Klonopin and states the Clonidine is not effective. Her pain doctor is ok with prescribing benzos to manage her anxiety.    Observations/Objective:  General Appearance: unable to assess  Eye Contact:  unable to assess  Speech:  Clear and Coherent and Normal Rate  Volume:  Normal  Mood:  Anxious  Affect:  Congruent  Thought Process:  Coherent and Descriptions of Associations: Circumstantial  Orientation:  Full (Time, Place, and Person)  Thought Content:  Logical  Suicidal Thoughts:  No  Homicidal Thoughts:  No  Memory:  Immediate;    Good  Judgement:  Fair  Insight:  Present  Psychomotor Activity: unable to assess  Concentration:  Concentration: Fair  Recall:  Fair  Fund of Knowledge:  Good  Language:  Good  Akathisia:  unable to assess  Handed:  Right  AIMS (if indicated):     Assets:  Communication Skills Desire for Improvement Financial Resources/Insurance Housing Intimacy Leisure Time Resilience Social Support Talents/Skills Transportation Vocational/Educational  ADL's:  unable to assess  Cognition:  WNL  Sleep:        Assessment and Plan: Depression screen [facility] 2/9 02/24/2021 11/11/2020  Decreased Interest 3 0  Down, Depressed, Hopeless 1 1  PHQ - 2 Score 4 1  Altered sleeping 0 -  Tired, decreased energy 3 -  Change in appetite 3 -  Feeling bad or failure about yourself  3 -  Moving slowly or fidgety/restless 0 -  Suicidal thoughts 0 -  PHQ-9 Score 13 -  Difficult doing work/chores Very difficult -  Some recent data might be hidden    Flowsheet Row Video Visit from 02/24/2021 in [facility] Video Visit from 11/11/2020 in [facility]  C-SSRS RISK CATEGORY No Risk No Risk      - her new pain doctor is prescribing percocet and Jenell states the pain doctor is ok prescribing controlled substances for her anxiety. I encouraged [name] to discuss it with her pain doctor as I am not  comfortable prescribing any benzos due to compounding sedative effects  -d/c Clonidine as it is ineffective  - referred for therapy for anxiety management  - increase Adderall to 30mg  BID  1. Attention deficit hyperactivity disorder (ADHD), predominantly inattentive type - amphetamine-dextroamphetamine (ADDERALL) 30 MG tablet; Take 1 tablet by mouth 2 (two) times daily.  Dispense: 60 tablet; Refill: 0 - amphetamine-dextroamphetamine (ADDERALL) 30 MG tablet; Take 1 tablet by mouth 2 (two) times daily.  Dispense: 60 tablet; Refill: 0 -  amphetamine-dextroamphetamine (ADDERALL) 20 MG tablet; Take 1 tablet (20 mg total) by mouth 2 (two) times daily.  Dispense: 60 tablet; Refill: 0  2. GAD (generalized anxiety disorder) - desvenlafaxine (PRISTIQ) 100 MG 24 hr tablet; Take 1 tablet (100 mg total) by mouth daily.  Dispense: 90 tablet; Refill: 0  3. Bipolar 1 disorder (HCC) - desvenlafaxine (PRISTIQ) 100 MG 24 hr tablet; Take 1 tablet (100 mg total) by mouth daily.  Dispense: 90 tablet; Refill: 0 - lamoTRIgine (LAMICTAL) 100 MG tablet; Take 3 tablets (300 mg total) by mouth daily.  Dispense: 270 tablet; Refill: 0   Follow Up Instructions: In 2-3 months or sooner if needed   I discussed the assessment and treatment plan with the patient. The patient was provided an opportunity to ask questions and all were answered. The patient agreed with the plan and demonstrated an understanding of the instructions.   The patient was advised to call back or seek an in-person evaluation if the symptoms worsen or if the condition fails to improve as anticipated.  I provided 22 minutes of non-face-to-face time during this encounter.   , MD

## 2021-03-07 ENCOUNTER — Other Ambulatory Visit: Payer: Self-pay | Admitting: Internal Medicine

## 2021-03-08 LAB — COMPLETE METABOLIC PANEL WITH GFR
AG Ratio: 1.8 (calc) (ref 1.0–2.5)
ALT: 9 U/L (ref 6–29)
AST: 13 U/L (ref 10–35)
Albumin: 4.3 g/dL (ref 3.6–5.1)
Alkaline phosphatase (APISO): 89 U/L (ref 31–125)
BUN: 16 mg/dL (ref 7–25)
CO2: 25 mmol/L (ref 20–32)
Calcium: 9.2 mg/dL (ref 8.6–10.2)
Chloride: 105 mmol/L (ref 98–110)
Creat: 0.87 mg/dL (ref 0.50–1.10)
GFR, Est African American: 93 mL/min/{1.73_m2} (ref >=60)
GFR, Est Non African American: 80 mL/min/{1.73_m2} (ref >=60)
Globulin: 2.4 g/dL (calc) (ref 1.9–3.7)
Glucose, Bld: 74 mg/dL (ref 65–99)
Potassium: 4.4 mmol/L (ref 3.5–5.3)
Sodium: 138 mmol/L (ref 135–146)
Total Bilirubin: 0.4 mg/dL (ref 0.2–1.2)
Total Protein: 6.7 g/dL (ref 6.1–8.1)

## 2021-03-08 LAB — CBC
HCT: 40.5 % (ref 35.0–45.0)
Hemoglobin: 12.9 g/dL (ref 11.7–15.5)
MCH: 26.8 pg — ABNORMAL LOW (ref 27.0–33.0)
MCHC: 31.9 g/dL — ABNORMAL LOW (ref 32.0–36.0)
MCV: 84.2 fL (ref 80.0–100.0)
MPV: 9.7 fL (ref 7.5–12.5)
Platelets: 363 10*3/uL (ref 140–400)
RBC: 4.81 10*6/uL (ref 3.80–5.10)
RDW: 15.2 % — ABNORMAL HIGH (ref 11.0–15.0)
WBC: 6.2 10*3/uL (ref 3.8–10.8)

## 2021-03-08 LAB — TSH: TSH: 0.61 mIU/L

## 2021-03-08 LAB — LIPID PANEL
Cholesterol: 213 mg/dL — ABNORMAL HIGH (ref <200)
HDL: 49 mg/dL — ABNORMAL LOW (ref >=50)
LDL Cholesterol (Calc): 145 mg/dL (calc) — ABNORMAL HIGH
Non-HDL Cholesterol (Calc): 164 mg/dL (calc) — ABNORMAL HIGH (ref <130)
Total CHOL/HDL Ratio: 4.3 (calc) (ref <5.0)
Triglycerides: 84 mg/dL (ref <150)

## 2021-03-08 LAB — VITAMIN D 25 HYDROXY (VIT D DEFICIENCY, FRACTURES): Vit D, 25-Hydroxy: 38 ng/mL (ref 30–100)

## 2021-03-22 ENCOUNTER — Encounter: Payer: Self-pay | Admitting: Orthopedic Surgery

## 2021-03-22 ENCOUNTER — Ambulatory Visit (INDEPENDENT_AMBULATORY_CARE_PROVIDER_SITE_OTHER): Payer: Medicaid Other | Admitting: Physician Assistant

## 2021-03-22 DIAGNOSIS — L97521 Non-pressure chronic ulcer of other part of left foot limited to breakdown of skin: Secondary | ICD-10-CM | POA: Diagnosis not present
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
## 2021-04-14 ENCOUNTER — Other Ambulatory Visit: Payer: Self-pay

## 2021-04-14 ENCOUNTER — Telehealth (HOSPITAL_COMMUNITY): Payer: Self-pay | Admitting: Psychiatry

## 2021-04-14 ENCOUNTER — Telehealth (HOSPITAL_COMMUNITY): Payer: Medicaid Other | Admitting: Psychiatry

## 2021-04-14 NOTE — Telephone Encounter (Signed)
I called the patient at our scheduled appointment time. There was no answer. I left a voice message for patient to call the clinic back at their convinence.   

## 2021-05-24 ENCOUNTER — Ambulatory Visit (INDEPENDENT_AMBULATORY_CARE_PROVIDER_SITE_OTHER): Payer: Medicaid Other | Admitting: Orthopedic Surgery

## 2021-05-24 ENCOUNTER — Other Ambulatory Visit: Payer: Self-pay

## 2021-05-24 DIAGNOSIS — L97521 Non-pressure chronic ulcer of other part of left foot limited to breakdown of skin: Secondary | ICD-10-CM

## 2021-05-30 ENCOUNTER — Ambulatory Visit
Admission: RE | Admit: 2021-05-30 | Discharge: 2021-05-30 | Disposition: A | Discharge status: Home / Self Care | Payer: Medicaid Other | Source: Ambulatory Visit | Attending: Internal Medicine | Admitting: Internal Medicine

## 2021-05-30 ENCOUNTER — Other Ambulatory Visit: Payer: Self-pay

## 2021-05-30 DIAGNOSIS — R928 Other abnormal and inconclusive findings on diagnostic imaging of breast: Secondary | ICD-10-CM

## 2021-06-07 ENCOUNTER — Encounter: Payer: Self-pay | Admitting: Orthopedic Surgery
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
## 2021-06-21 ENCOUNTER — Ambulatory Visit: Payer: Medicaid Other | Admitting: Orthopedic Surgery

## 2021-06-28 ENCOUNTER — Encounter: Payer: Self-pay | Admitting: Orthopedic Surgery

## 2021-06-28 ENCOUNTER — Ambulatory Visit (INDEPENDENT_AMBULATORY_CARE_PROVIDER_SITE_OTHER): Payer: Medicaid Other | Admitting: Orthopedic Surgery

## 2021-06-28 DIAGNOSIS — M21372 Foot drop, left foot: Secondary | ICD-10-CM

## 2021-06-28 DIAGNOSIS — L97521 Non-pressure chronic ulcer of other part of left foot limited to breakdown of skin: Secondary | ICD-10-CM
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
## 2021-07-29 ENCOUNTER — Other Ambulatory Visit: Payer: Self-pay

## 2021-07-29 ENCOUNTER — Encounter (HOSPITAL_COMMUNITY): Payer: Self-pay | Admitting: Emergency Medicine

## 2021-07-29 ENCOUNTER — Ambulatory Visit (HOSPITAL_COMMUNITY)
Admission: EM | Admit: 2021-07-29 | Discharge: 2021-07-29 | Disposition: A | Discharge status: Home / Self Care | Payer: Medicaid Other | Attending: Urgent Care | Admitting: Urgent Care

## 2021-07-29 DIAGNOSIS — L249 Irritant contact dermatitis, unspecified cause: Secondary | ICD-10-CM

## 2021-07-29 DIAGNOSIS — L739 Follicular disorder, unspecified: Secondary | ICD-10-CM

## 2021-07-29 MED ORDER — TRIAMCINOLONE ACETONIDE 0.1 % EX CREA: 1.0000 "application " | TOPICAL_CREAM | Freq: Two times a day (BID) | CUTANEOUS | 0 refills | Status: DC

## 2021-07-29 MED ORDER — DOXYCYCLINE HYCLATE 100 MG PO CAPS: 100.0000 mg | ORAL_CAPSULE | Freq: Two times a day (BID) | ORAL | 0 refills | Status: DC

## 2021-07-29 NOTE — Discharge Instructions (Addendum)
Please just use Tylenol at a dose of 500mg -650mg  once every 6 hours as needed for your aches, pains, fevers. Use doxycycline for the infection called folliculitis twice daily with food. Finish the 10 day course completely.  Use triamcinolone cream twice daily to the irritant contact dermatitis over the right side of your neck/scalp. Stop using after a week, if you have the rash then come back.
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
## 2021-07-29 NOTE — ED Triage Notes (Signed)
Pt presents with insect bite/ abscess under right arm.

## 2021-09-27 ENCOUNTER — Ambulatory Visit (INDEPENDENT_AMBULATORY_CARE_PROVIDER_SITE_OTHER): Payer: Medicaid Other | Admitting: Orthopedic Surgery

## 2021-09-27 DIAGNOSIS — L97521 Non-pressure chronic ulcer of other part of left foot limited to breakdown of skin: Secondary | ICD-10-CM | POA: Diagnosis not present

## 2021-10-23 ENCOUNTER — Encounter: Payer: Self-pay | Admitting: Orthopedic Surgery
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
## 2021-12-26 ENCOUNTER — Ambulatory Visit (INDEPENDENT_AMBULATORY_CARE_PROVIDER_SITE_OTHER): Payer: Medicaid Other | Admitting: Orthopedic Surgery

## 2021-12-26 ENCOUNTER — Encounter: Payer: Self-pay | Admitting: Orthopedic Surgery

## 2021-12-26 DIAGNOSIS — L97521 Non-pressure chronic ulcer of other part of left foot limited to breakdown of skin: Secondary | ICD-10-CM

## 2021-12-26 DIAGNOSIS — M21372 Foot drop, left foot: Secondary | ICD-10-CM | POA: Diagnosis not present
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
## 2022-01-23 ENCOUNTER — Ambulatory Visit: Payer: Medicaid Other | Admitting: Orthopedic Surgery

## 2022-02-20 ENCOUNTER — Ambulatory Visit (INDEPENDENT_AMBULATORY_CARE_PROVIDER_SITE_OTHER): Payer: Medicaid Other

## 2022-02-20 ENCOUNTER — Ambulatory Visit (INDEPENDENT_AMBULATORY_CARE_PROVIDER_SITE_OTHER): Payer: Medicaid Other | Admitting: Orthopedic Surgery

## 2022-02-20 DIAGNOSIS — M6702 Short Achilles tendon (acquired), left ankle: Secondary | ICD-10-CM

## 2022-02-20 DIAGNOSIS — M79672 Pain in left foot: Secondary | ICD-10-CM | POA: Diagnosis not present

## 2022-02-21 ENCOUNTER — Encounter: Payer: Self-pay | Admitting: Orthopedic Surgery
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
## 2022-03-20 ENCOUNTER — Encounter: Payer: Self-pay | Admitting: Orthopedic Surgery

## 2022-03-20 ENCOUNTER — Ambulatory Visit (INDEPENDENT_AMBULATORY_CARE_PROVIDER_SITE_OTHER): Payer: Medicaid Other | Admitting: Orthopedic Surgery

## 2022-03-20 DIAGNOSIS — M6702 Short Achilles tendon (acquired), left ankle: Secondary | ICD-10-CM

## 2022-03-20 DIAGNOSIS — L97521 Non-pressure chronic ulcer of other part of left foot limited to breakdown of skin: Secondary | ICD-10-CM
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
## 2022-04-02 ENCOUNTER — Encounter (HOSPITAL_COMMUNITY): Payer: Self-pay | Admitting: Emergency Medicine

## 2022-04-02 ENCOUNTER — Ambulatory Visit (HOSPITAL_COMMUNITY)
Admission: EM | Admit: 2022-04-02 | Discharge: 2022-04-02 | Disposition: A | Discharge status: Home / Self Care | Payer: Medicaid Other

## 2022-04-02 ENCOUNTER — Other Ambulatory Visit: Payer: Self-pay

## 2022-04-02 DIAGNOSIS — L02415 Cutaneous abscess of right lower limb: Secondary | ICD-10-CM

## 2022-04-02 MED ORDER — DOXYCYCLINE HYCLATE 100 MG PO CAPS: 100.0000 mg | ORAL_CAPSULE | Freq: Two times a day (BID) | ORAL | 0 refills | Status: AC

## 2022-04-02 MED ORDER — MUPIROCIN CALCIUM 2 % EX CREA: 1.0000 | TOPICAL_CREAM | Freq: Two times a day (BID) | CUTANEOUS | 0 refills | Status: DC

## 2022-04-02 NOTE — ED Triage Notes (Signed)
Abscess to right buttocks.  Noticed Wednesday or Thursday.  Patient and family attempted to lance on Friday.  Since then, continues to hurt and reportedly is getting worse
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
## 2022-04-02 NOTE — Discharge Instructions (Addendum)
-  Doxycycline twice daily for 7 days.  Make sure to wear sunscreen while spending time outside while on this medication as it can increase your chance of sunburn. You can take this medication with food if you have a sensitive stomach. -Mupirocin twice daily  -Wash with gentle soap and water twice daily -Warm compress twice daily, leave on for 5 minutes -Symptoms should be improving instead of worsening after 24 hours on the antibiotic.  If the abscess is getting bigger, or new symptoms like fevers, after 24 hours on the antibiotic-head to the emergency department.

## 2022-04-04 ENCOUNTER — Telehealth (HOSPITAL_COMMUNITY): Payer: Self-pay | Admitting: Emergency Medicine

## 2022-04-04 MED ORDER — MUPIROCIN 2 % EX OINT
1.0000 | TOPICAL_OINTMENT | Freq: Two times a day (BID) | CUTANEOUS | 0 refills | Status: AC
Start: 2022-04-04 — End: 2022-04-11

## 2022-04-04 NOTE — Telephone Encounter (Signed)
Patient requested prescription for ointment, cream was not covered.   Resent with permission from [place], [place]

## 2022-04-17 ENCOUNTER — Ambulatory Visit (INDEPENDENT_AMBULATORY_CARE_PROVIDER_SITE_OTHER): Payer: Medicaid Other | Admitting: Orthopedic Surgery

## 2022-04-17 DIAGNOSIS — M6702 Short Achilles tendon (acquired), left ankle: Secondary | ICD-10-CM | POA: Diagnosis not present

## 2022-04-17 DIAGNOSIS — L97521 Non-pressure chronic ulcer of other part of left foot limited to breakdown of skin: Secondary | ICD-10-CM | POA: Diagnosis not present

## 2022-04-18 ENCOUNTER — Encounter: Payer: Self-pay | Admitting: Orthopedic Surgery
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
## 2022-05-13 DIAGNOSIS — X501XXA Overexertion from prolonged static or awkward postures, initial encounter: Secondary | ICD-10-CM | POA: Insufficient documentation

## 2022-05-13 DIAGNOSIS — Y93K1 Activity, walking an animal: Secondary | ICD-10-CM | POA: Insufficient documentation

## 2022-05-13 DIAGNOSIS — L03114 Cellulitis of left upper limb: Secondary | ICD-10-CM | POA: Diagnosis not present

## 2022-05-13 DIAGNOSIS — M7989 Other specified soft tissue disorders: Secondary | ICD-10-CM | POA: Diagnosis present

## 2022-05-13 DIAGNOSIS — M25571 Pain in right ankle and joints of right foot: Secondary | ICD-10-CM | POA: Insufficient documentation

## 2022-05-14 ENCOUNTER — Encounter (HOSPITAL_COMMUNITY): Payer: Self-pay | Admitting: *Deleted

## 2022-05-14 ENCOUNTER — Emergency Department (HOSPITAL_COMMUNITY): Payer: Medicaid Other

## 2022-05-14 ENCOUNTER — Other Ambulatory Visit: Payer: Self-pay

## 2022-05-14 ENCOUNTER — Emergency Department (HOSPITAL_COMMUNITY)
Admission: EM | Admit: 2022-05-14 | Discharge: 2022-05-14 | Disposition: A | Discharge status: Home / Self Care | Payer: Medicaid Other | Attending: Emergency Medicine | Admitting: Emergency Medicine

## 2022-05-14 DIAGNOSIS — L03114 Cellulitis of left upper limb: Secondary | ICD-10-CM

## 2022-05-14 LAB — CBC WITH DIFFERENTIAL/PLATELET
Abs Immature Granulocytes: 0.03 10*3/uL (ref 0.00–0.07)
Basophils Absolute: 0.1 10*3/uL (ref 0.0–0.1)
Basophils Relative: 1 %
Eosinophils Absolute: 0.4 10*3/uL (ref 0.0–0.5)
Eosinophils Relative: 4 %
HCT: 39.7 % (ref 36.0–46.0)
Hemoglobin: 12.9 g/dL (ref 12.0–15.0)
Immature Granulocytes: 0 %
Lymphocytes Relative: 24 %
Lymphs Abs: 2.1 10*3/uL (ref 0.7–4.0)
MCH: 30.1 pg (ref 26.0–34.0)
MCHC: 32.5 g/dL (ref 30.0–36.0)
MCV: 92.8 fL (ref 80.0–100.0)
Monocytes Absolute: 0.6 10*3/uL (ref 0.1–1.0)
Monocytes Relative: 6 %
Neutro Abs: 5.5 10*3/uL (ref 1.7–7.7)
Neutrophils Relative %: 65 %
Platelets: 289 10*3/uL (ref 150–400)
RBC: 4.28 MIL/uL (ref 3.87–5.11)
RDW: 12.6 % (ref 11.5–15.5)
WBC: 8.6 10*3/uL (ref 4.0–10.5)
nRBC: 0 % (ref 0.0–0.2)

## 2022-05-14 LAB — BASIC METABOLIC PANEL
Anion gap: 8 (ref 5–15)
BUN: 22 mg/dL — ABNORMAL HIGH (ref 6–20)
CO2: 26 mmol/L (ref 22–32)
Calcium: 9 mg/dL (ref 8.9–10.3)
Chloride: 109 mmol/L (ref 98–111)
Creatinine, Ser: 0.91 mg/dL (ref 0.44–1.00)
GFR, Estimated: >60 mL/min (ref >60)
Glucose, Bld: 92 mg/dL (ref 70–99)
Potassium: 4.3 mmol/L (ref 3.5–5.1)
Sodium: 143 mmol/L (ref 135–145)

## 2022-05-14 LAB — SEDIMENTATION RATE: Sed Rate: 1 mm/hr (ref 0–22)

## 2022-05-14 LAB — C-REACTIVE PROTEIN: CRP: 1.2 mg/dL — ABNORMAL HIGH (ref <1.0)

## 2022-05-14 MED ORDER — OXYCODONE HCL 5 MG PO TABS
5.0000 mg | ORAL_TABLET | Freq: Once | ORAL | Status: AC
  Administered 2022-05-14: 5 mg via ORAL
  Filled 2022-05-14: qty 1

## 2022-05-14 MED ORDER — DOXYCYCLINE HYCLATE 100 MG PO CAPS: 100.0000 mg | ORAL_CAPSULE | Freq: Two times a day (BID) | ORAL | 0 refills | Status: DC

## 2022-05-14 MED ORDER — DOXYCYCLINE HYCLATE 100 MG PO TABS
100.0000 mg | ORAL_TABLET | Freq: Once | ORAL | Status: AC
  Administered 2022-05-14: 100 mg via ORAL
  Filled 2022-05-14: qty 1

## 2022-05-14 NOTE — ED Provider Triage Note (Signed)
Emergency Medicine Provider Triage Evaluation Note  Nancy Bowers , a 46 y.o. female  was evaluated in triage.  Pt complains of left hand pain as well as right ankle pain, patient states right hand pain started today, woke up with it swollen, denies any traumatic injury to it, states that she has a history of MRSA infections and had to have her left toe removed due to it, she denies any wounds to the area, she denies any IV drug use no fevers or chills no history of autoimmune diseases and or gout.  States that she has right ankle pain this is from a fall twisted it denies any head losing conscious..  Review of Systems  Positive: Left hand/right ankle pain Negative: Fevers chills  Physical Exam  BP (!) 161/99 (BP Location: Right Arm)   Pulse 82   Temp 98.4 F (36.9 C) (Oral)   Resp 18   SpO2 98%  Gen:   Awake, no distress   Resp:  Normal effort  MSK:   Moves extremities without difficulty  Other:    Medical Decision Making  Medically screening exam initiated at 1:13 AM.  Appropriate orders placed.  Nancy Bowers was informed that the remainder of the evaluation will be completed by another provider, this initial triage assessment does not replace that evaluation, and the importance of remaining in the ED until their evaluation is complete.  Lab work imaging been ordered will need further work-up.   Carroll Sage, PA-C 05/14/22 [phone]

## 2022-05-14 NOTE — ED Triage Notes (Signed)
Pt reports left hand swelling and redness since today. Hx of staph infections. Also she states she stepped in a hole and twisted her right ankle.

## 2022-05-14 NOTE — ED Notes (Signed)
RN applied ankle brace and answered all pt questions

## 2022-05-14 NOTE — ED Provider Notes (Signed)
[facility] EMERGENCY DEPARTMENT Provider Note   CSN: [id number] Arrival date & time: 05/13/22  2349     History  Chief Complaint  Patient presents with   Hand Problem    Nancy Bowers is a 46 y.o. female with a past medical history of generalized anxiety disorder presenting today with joint pain.  She reports that yesterday she woke up with pain and swelling around her knuckles.  She went back to sleep and when she woke up it was spreading of her arm.  Does not report any trauma or known insect/animal bites.  Does report she sits outside.  History of MRSA.  No history of diabetes, IVDU or immunosuppression.  Also complaining of right ankle discomfort after rolling her ankle while walking her dog.  Follows with Nancy Bowers with orthopedics for foot pain, calluses and ulcers.  HPI     Home Medications Prior to Admission medications   Medication Sig Start Date End Date Taking? Authorizing Provider  albuterol (PROVENTIL HFA;VENTOLIN HFA) 108 (90 Base) MCG/ACT inhaler Inhale 1-2 puffs into the lungs every 4 (four) hours as needed for wheezing or shortness of breath.    [provider]  ALPRAZolam Duanne Moron) 0.5 MG tablet TAKE 1/2 TABLET BY MOUTH DAILY, AS NEEDED 01/25/22   [provider]  amphetamine-dextroamphetamine (ADDERALL) 30 MG tablet Take 1 tablet by mouth 2 (two) times daily. 02/24/21 02/24/22  Nancy Cradle, MD  amphetamine-dextroamphetamine (ADDERALL) 30 MG tablet Take 1 tablet by mouth 2 (two) times daily. 02/24/21 02/24/22  Nancy Cradle, MD  amphetamine-dextroamphetamine (ADDERALL) 30 MG tablet Take 1 tablet by mouth 2 (two) times daily. 02/24/21 02/24/22  Nancy Cradle, MD  Ascorbic Acid (VITAMIN C) 1000 MG tablet Take 1,000 mg by mouth daily.    [provider]  cetirizine (ZYRTEC) 10 MG tablet Take 10 mg by mouth daily.    [provider]  desvenlafaxine (PRISTIQ) 100 MG 24 hr tablet Take 1 tablet (100 mg total) by mouth daily.  02/24/21   Nancy Cradle, MD  Ferrous Sulfate (IRON) 28 MG TABS Take 28 mg by mouth daily.    [provider]  gabapentin (NEURONTIN) 600 MG tablet Take 600 mg by mouth 3 (three) times daily.     [provider]  lamoTRIgine (LAMICTAL) 100 MG tablet Take 3 tablets (300 mg total) by mouth daily. 02/24/21   Nancy Cradle, MD  methocarbamol (ROBAXIN) 750 MG tablet Take 750 mg by mouth 4 (four) times daily as needed. 01/12/22   [provider]  oxyCODONE-acetaminophen (PERCOCET) 5-325 MG tablet Take 1 tablet by mouth every 6 (six) hours as needed for severe pain.    [provider]  tiZANidine (ZANAFLEX) 4 MG tablet Take 6 mg by mouth every 8 (eight) hours as needed for muscle spasms.    [provider]  triamcinolone cream (KENALOG) 0.1 % Apply 1 application topically 2 (two) times daily. Patient not taking: Reported on 04/02/2022 07/29/21   Nancy Eagles, PA-C      Allergies    Aspirin, Bee venom, Fentanyl, and Tylenol [acetaminophen]    Review of Systems   Review of Systems  Physical Exam Updated Vital Signs BP 134/88 (BP Location: Right Arm)   Pulse 63   Temp 98.4 F (36.9 C) (Oral)   Resp 18   SpO2 95%  Physical Exam Vitals and nursing note reviewed.  Constitutional:      Appearance: Normal appearance.  HENT:     Head: Normocephalic and atraumatic.  Eyes:     General: No scleral icterus.    Conjunctiva/sclera: Conjunctivae normal.  Pulmonary:     Effort: Pulmonary effort is normal. No respiratory distress.  Musculoskeletal:     Comments: Full range of motion of left wrist and right ankle.  Dopplerable radial and DP pulses.  Erythema across the dorsal MCPs with some streaking up patient's forearm.  Skin:    Findings: No rash.  Neurological:     Mental Status: She is alert.  Psychiatric:        Mood and Affect: Mood normal.     ED Results / Procedures / Treatments   Labs (all labs ordered are listed, but only abnormal results  are displayed) Labs Reviewed  C-REACTIVE PROTEIN - Abnormal; Notable for the following components:      Result Value   CRP 1.2 (*)    All other components within normal limits  BASIC METABOLIC PANEL - Abnormal; Notable for the following components:   BUN 22 (*)    All other components within normal limits  SEDIMENTATION RATE  CBC WITH DIFFERENTIAL/PLATELET    EKG None  Radiology DG Ankle Complete Right  Result Date: 05/14/2022 CLINICAL DATA:  Concern of osteomyelitis, pain located on the lateral malleolus EXAM: RIGHT ANKLE - COMPLETE 3+ VIEW COMPARISON:  None Available. FINDINGS: Lateral soft tissue swelling. No acute bony abnormality. No fracture, subluxation or dislocation. No bone destruction to suggest osteomyelitis. No soft tissue gas. IMPRESSION: Lateral soft tissue swelling.  No acute bony abnormality. Electronically Signed   By: Charlett Nose M.D.   On: 05/14/2022 01:56   DG Hand Complete Left  Result Date: 05/14/2022 CLINICAL DATA:  Left hand swelling EXAM: LEFT HAND - COMPLETE 3+ VIEW COMPARISON:  None Available. FINDINGS: Soft tissue swelling within the left hand. No acute bony abnormality. Specifically, no fracture, subluxation, or dislocation. Joint spaces maintained. No radiopaque foreign bodies. IMPRESSION: Soft tissue swelling.  No acute bony abnormality. Electronically Signed   By: Charlett Nose M.D.   On: 05/14/2022 01:54    Procedures Procedures   Medications Ordered in ED Medications  doxycycline (VIBRA-TABS) tablet 100 mg (has no administration in time range)  oxyCODONE (Oxy IR/ROXICODONE) immediate release tablet 5 mg (has no administration in time range)  oxyCODONE (Oxy IR/ROXICODONE) immediate release tablet 5 mg (5 mg Oral Bowers 05/14/22 0940)    ED Course/ Medical Decision Making/ A&P                           Medical Decision Making Risk Prescription drug management.   This is a pleasant 46 year old female presenting today with right hand swelling  and erythema.  Noticed it yesterday morning when she woke up.  Differential includes but is not limited to gout, septic arthritis, cellulitis, bug bite.   This is not an exhaustive differential.    Past Medical History / Co-morbidities / Social History: Anxiety   Additional history: Follows with due to with orthopedic no history of hand surgery   Physical Exam: Pertinent physical exam findings include Swelling to the entire right hand with some erythematous streaking up her forearm.  Small areas of urticaria to the dorsal surface of the hand.  Lab Tests: Lab work ordered in triage and personally interpreted labs.  The pertinent results include: Normal WBC Normal sed rate   Imaging Studies: Imaging ordered in triage and independently visualized and interpreted by me.  I agree with the radiologist that there are no  intra-articular abnormalities in the hand or ankle.  Only soft tissue swelling.     Medications: I ordered medication including oxycodone. Reevaluation of the patient after these medicines showed that the patient improved. I have reviewed the patients home medicines and have made adjustments as needed.   MDM/Disposition: This is a pleasant 46 year old female presenting today with right hand swelling and erythema.  Noticed it yesterday morning when she woke up.  Differential includes but is not limited to gout, septic arthritis, cellulitis, bug bite.  On physical exam she appears to have some contact dermatitis to the dorsal surface of her hand also with inflammation and streaking up her forearm.  Question insect bite versus contact dermatitis however ultimately she will be treated with doxycycline for cellulitis which will also cover her for any abnormal tick bite.Bowers a brace for rolled ankle and can speak about this injury with Dr. Lajoyce Corners as needed.  She is agreeable to the plan.      I discussed this case with my attending physician Dr. Rodena Medin who saw the patient,  cosigned this note including patient's presenting symptoms, physical exam, and planned diagnostics and interventions. Attending physician stated agreement with plan or made changes to plan which were implemented.      Final Clinical Impression(s) / ED Diagnoses Final diagnoses:  Cellulitis of left hand    Rx / DC Orders ED Discharge Orders          Ordered    doxycycline (VIBRAMYCIN) 100 MG capsule  2 times daily        05/14/22 0955           Results and diagnoses were explained to the patient. Return precautions discussed in full. Patient had no additional questions and expressed complete understanding.   This chart was dictated using voice recognition software.  Despite best efforts to proofread,  errors can occur which can change the documentation meaning.    Woodroe Chen 05/14/22 1007    Wynetta Fines, MD 05/15/22 [phone]

## 2022-05-14 NOTE — Discharge Instructions (Addendum)
Take the antibiotics as prescribed.  Return with any worsening symptoms, especially fevers, chills, further decreased sensation or inability to move your hand.

## 2022-05-22 ENCOUNTER — Emergency Department (HOSPITAL_COMMUNITY)
Admission: EM | Admit: 2022-05-22 | Discharge: 2022-05-22 | Disposition: A | Discharge status: Home / Self Care | Payer: Medicaid Other | Attending: Emergency Medicine | Admitting: Emergency Medicine

## 2022-05-22 DIAGNOSIS — Z20822 Contact with and (suspected) exposure to covid-19: Secondary | ICD-10-CM | POA: Diagnosis not present

## 2022-05-22 LAB — SARS CORONAVIRUS 2 BY RT PCR: SARS Coronavirus 2 by RT PCR: NEGATIVE

## 2022-05-22 NOTE — ED Provider Notes (Signed)
[facility] EMERGENCY DEPARTMENT Provider Note   CSN: [id number] Arrival date & time: 05/22/22  1745     History  Chief Complaint  Patient presents with   Covid Exposure    Nancy Bowers is a 46 y.o. female.  HPI   Patient presents due to COVID exposure.  They are entirely asymptomatic.  Report they just request testing for COVID.  Denies chest pain, shortness of breath, difficulty breathing.  Home Medications Prior to Admission medications   Medication Sig Start Date End Date Taking? Authorizing Provider  albuterol (PROVENTIL HFA;VENTOLIN HFA) 108 (90 Base) MCG/ACT inhaler Inhale 1-2 puffs into the lungs every 4 (four) hours as needed for wheezing or shortness of breath.    [provider]  ALPRAZolam Prudy Feeler) 0.5 MG tablet TAKE 1/2 TABLET BY MOUTH DAILY, AS NEEDED 01/25/22   [provider]  amphetamine-dextroamphetamine (ADDERALL) 30 MG tablet Take 1 tablet by mouth 2 (two) times daily. 02/24/21 02/24/22  Oletta Darter, MD  amphetamine-dextroamphetamine (ADDERALL) 30 MG tablet Take 1 tablet by mouth 2 (two) times daily. 02/24/21 02/24/22  Oletta Darter, MD  amphetamine-dextroamphetamine (ADDERALL) 30 MG tablet Take 1 tablet by mouth 2 (two) times daily. 02/24/21 02/24/22  Oletta Darter, MD  Ascorbic Acid (VITAMIN C) 1000 MG tablet Take 1,000 mg by mouth daily.    [provider]  cetirizine (ZYRTEC) 10 MG tablet Take 10 mg by mouth daily.    [provider]  desvenlafaxine (PRISTIQ) 100 MG 24 hr tablet Take 1 tablet (100 mg total) by mouth daily. 02/24/21   Oletta Darter, MD  doxycycline (VIBRAMYCIN) 100 MG capsule Take 1 capsule (100 mg total) by mouth 2 (two) times daily. 05/14/22   Redwine, Madison A, PA-C  Ferrous Sulfate (IRON) 28 MG TABS Take 28 mg by mouth daily.    [provider]  gabapentin (NEURONTIN) 600 MG tablet Take 600 mg by mouth 3 (three) times daily.     [provider]  lamoTRIgine (LAMICTAL) 100  MG tablet Take 3 tablets (300 mg total) by mouth daily. 02/24/21   Oletta Darter, MD  methocarbamol (ROBAXIN) 750 MG tablet Take 750 mg by mouth 4 (four) times daily as needed. 01/12/22   [provider]  oxyCODONE-acetaminophen (PERCOCET) 5-325 MG tablet Take 1 tablet by mouth every 6 (six) hours as needed for severe pain.    [provider]  tiZANidine (ZANAFLEX) 4 MG tablet Take 6 mg by mouth every 8 (eight) hours as needed for muscle spasms.    [provider]  triamcinolone cream (KENALOG) 0.1 % Apply 1 application topically 2 (two) times daily. Patient not taking: Reported on 04/02/2022 07/29/21   Wallis Bamberg, PA-C      Allergies    Aspirin, Bee venom, Fentanyl, and Tylenol [acetaminophen]    Review of Systems   Review of Systems  Physical Exam Updated Vital Signs BP 120/79 (BP Location: Right Arm)   Pulse 87   Temp 98 F (36.7 C) (Oral)   Resp 16   SpO2 96%  Physical Exam Vitals and nursing note reviewed. Exam conducted with a chaperone present.  Constitutional:      Appearance: Normal appearance.  HENT:     Head: Normocephalic and atraumatic.  Eyes:     General: No scleral icterus.       Right eye: No discharge.        Left eye: No discharge.     Extraocular Movements: Extraocular movements intact.  Pupils: Pupils are equal, round, and reactive to light.  Cardiovascular:     Rate and Rhythm: Normal rate and regular rhythm.     Pulses: Normal pulses.     Heart sounds: Normal heart sounds. No murmur heard.    No friction rub. No gallop.  Pulmonary:     Effort: Pulmonary effort is normal. No respiratory distress.     Breath sounds: Normal breath sounds.  Abdominal:     General: Abdomen is flat. Bowel sounds are normal. There is no distension.     Palpations: Abdomen is soft.     Tenderness: There is no abdominal tenderness.  Skin:    General: Skin is warm and dry.     Coloration: Skin is not jaundiced.  Neurological:     Mental  Status: She is alert. Mental status is at baseline.     Coordination: Coordination normal.     ED Results / Procedures / Treatments   Labs (all labs ordered are listed, but only abnormal results are displayed) Labs Reviewed  SARS CORONAVIRUS 2 BY RT PCR    EKG None  Radiology No results found.  Procedures Procedures    Medications Ordered in ED Medications - No data to display  ED Course/ Medical Decision Making/ A&P                           Medical Decision Making  Asymptomatic patient presenting today for COVID testing due to exposure from son.  Physical exam is unremarkable, vital stable and does not meet SIRS criteria.  COVID testing obtained, will check MyChart for the results.  Given no hypoxia, tachycardia or concerning symptoms do not think any additional work-up is indicated at this time.        Final Clinical Impression(s) / ED Diagnoses Final diagnoses:  Exposure to COVID-19 virus    Rx / DC Orders ED Discharge Orders     None         Theron Arista, Cordelia Poche 05/22/22 Threasa Beards, MD 05/22/22 2040

## 2022-05-22 NOTE — Discharge Instructions (Signed)
Check MyChart for those the COVID-19 testing.  Drink plenty of fluids, take Tylenol Motrin if you develop fever or body aches.

## 2022-05-22 NOTE — ED Triage Notes (Signed)
Pt exposed to covid through covid+ son. No symptoms, requesting test.   

## 2022-05-23 ENCOUNTER — Telehealth (HOSPITAL_COMMUNITY): Payer: Self-pay

## 2022-05-23 ENCOUNTER — Ambulatory Visit: Payer: Medicaid Other | Admitting: Family

## 2022-05-25 ENCOUNTER — Other Ambulatory Visit: Payer: Self-pay | Admitting: Internal Medicine

## 2022-05-25 DIAGNOSIS — N6489 Other specified disorders of breast: Secondary | ICD-10-CM

## 2022-06-15 ENCOUNTER — Ambulatory Visit (INDEPENDENT_AMBULATORY_CARE_PROVIDER_SITE_OTHER): Payer: Medicaid Other | Admitting: Orthopedic Surgery

## 2022-06-15 ENCOUNTER — Other Ambulatory Visit: Payer: Self-pay | Admitting: Neurosurgery

## 2022-06-15 DIAGNOSIS — L97521 Non-pressure chronic ulcer of other part of left foot limited to breakdown of skin: Secondary | ICD-10-CM | POA: Diagnosis not present

## 2022-06-15 DIAGNOSIS — M6702 Short Achilles tendon (acquired), left ankle: Secondary | ICD-10-CM | POA: Diagnosis not present

## 2022-06-15 DIAGNOSIS — M4802 Spinal stenosis, cervical region: Secondary | ICD-10-CM

## 2022-06-20 ENCOUNTER — Encounter: Payer: Self-pay | Admitting: Orthopedic Surgery
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
## 2022-07-03 ENCOUNTER — Ambulatory Visit
Admission: RE | Admit: 2022-07-03 | Discharge: 2022-07-03 | Disposition: A | Discharge status: Home / Self Care | Payer: Medicaid Other | Source: Ambulatory Visit | Attending: Internal Medicine | Admitting: Internal Medicine

## 2022-07-03 DIAGNOSIS — N6489 Other specified disorders of breast: Secondary | ICD-10-CM

## 2022-07-12 ENCOUNTER — Ambulatory Visit
Admission: RE | Admit: 2022-07-12 | Discharge: 2022-07-12 | Disposition: A | Discharge status: Home / Self Care | Payer: Medicaid Other | Source: Ambulatory Visit | Attending: Neurosurgery | Admitting: Neurosurgery

## 2022-07-12 DIAGNOSIS — M4802 Spinal stenosis, cervical region: Secondary | ICD-10-CM

## 2022-08-15 ENCOUNTER — Ambulatory Visit (INDEPENDENT_AMBULATORY_CARE_PROVIDER_SITE_OTHER): Payer: Medicaid Other | Admitting: Orthopedic Surgery

## 2022-08-15 DIAGNOSIS — M6702 Short Achilles tendon (acquired), left ankle: Secondary | ICD-10-CM

## 2022-08-15 DIAGNOSIS — L97521 Non-pressure chronic ulcer of other part of left foot limited to breakdown of skin: Secondary | ICD-10-CM

## 2022-08-27 ENCOUNTER — Encounter: Payer: Self-pay | Admitting: Orthopedic Surgery
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
## 2023-01-29 ENCOUNTER — Ambulatory Visit: Payer: Medicaid Other | Admitting: Orthopedic Surgery

## 2023-02-06 ENCOUNTER — Ambulatory Visit (INDEPENDENT_AMBULATORY_CARE_PROVIDER_SITE_OTHER): Payer: Medicaid Other

## 2023-02-06 ENCOUNTER — Ambulatory Visit (INDEPENDENT_AMBULATORY_CARE_PROVIDER_SITE_OTHER): Payer: Medicaid Other | Admitting: Orthopedic Surgery

## 2023-02-06 DIAGNOSIS — M79672 Pain in left foot: Secondary | ICD-10-CM | POA: Diagnosis not present

## 2023-02-07 ENCOUNTER — Other Ambulatory Visit: Payer: Self-pay

## 2023-02-07 ENCOUNTER — Telehealth: Payer: Self-pay

## 2023-02-07 LAB — URIC ACID: Uric Acid, Serum: 6.4 mg/dL (ref 2.5–7.0)

## 2023-02-07 MED ORDER — ALLOPURINOL 100 MG PO TABS: 100.0000 mg | ORAL_TABLET | Freq: Every day | ORAL | 3 refills | Status: DC

## 2023-02-07 NOTE — Telephone Encounter (Signed)
Called pt to advise rx sent to pharm and follow up appt sch for 03/06/2023 at 1 pm voiced understanding and will call with questions.
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
## 2023-02-07 NOTE — Telephone Encounter (Signed)
-----   Message from Nadara Mustard, MD sent at 02/07/2023  8:20 AM EDT ----- Patient's symptoms may be secondary to high uric acid level.  Uric acid 6.4.  Call in a prescription for allopurinol 100 mg daily.  We will need to recheck her uric acid level at follow-up in 4 weeks. ----- Message ----- From: Janace Hoard Lab Results In Sent: 02/07/2023   1:14 AM EDT To: Nadara Mustard, MD

## 2023-03-06 ENCOUNTER — Ambulatory Visit: Payer: Medicaid Other | Admitting: Orthopedic Surgery

## 2023-03-13 ENCOUNTER — Encounter: Payer: Self-pay | Admitting: Orthopedic Surgery

## 2023-03-13 ENCOUNTER — Ambulatory Visit (INDEPENDENT_AMBULATORY_CARE_PROVIDER_SITE_OTHER): Payer: Medicaid Other | Admitting: Orthopedic Surgery

## 2023-03-13 DIAGNOSIS — M79672 Pain in left foot: Secondary | ICD-10-CM
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
## 2023-03-14 ENCOUNTER — Telehealth: Payer: Self-pay

## 2023-03-14 LAB — URIC ACID: Uric Acid, Serum: 3 mg/dL (ref 2.5–7.0)

## 2023-03-14 NOTE — Telephone Encounter (Signed)
-----   Message from Nadara Mustard, MD sent at 03/14/2023  6:33 AM EDT ----- Uric acid 3.0 continue with allopurinol 100 mg a day. ----- Message ----- From: Janace Hoard Lab Results In Sent: 03/14/2023   1:23 AM EDT To: Nadara Mustard, MD

## 2023-03-14 NOTE — Telephone Encounter (Signed)
Called pt and advised of uric acid level no questions or concerns.

## 2023-05-10 ENCOUNTER — Ambulatory Visit: Payer: Medicaid Other | Admitting: Orthopedic Surgery

## 2023-06-19 ENCOUNTER — Other Ambulatory Visit: Payer: Self-pay | Admitting: Internal Medicine

## 2023-06-19 DIAGNOSIS — Z1231 Encounter for screening mammogram for malignant neoplasm of breast: Secondary | ICD-10-CM

## 2023-06-28 ENCOUNTER — Ambulatory Visit: Payer: Medicaid Other | Admitting: Orthopedic Surgery

## 2023-06-28 ENCOUNTER — Encounter: Payer: Self-pay | Admitting: Orthopedic Surgery

## 2023-06-28 DIAGNOSIS — M6702 Short Achilles tendon (acquired), left ankle: Secondary | ICD-10-CM

## 2023-06-28 DIAGNOSIS — M79672 Pain in left foot: Secondary | ICD-10-CM | POA: Diagnosis not present

## 2023-06-28 DIAGNOSIS — L97521 Non-pressure chronic ulcer of other part of left foot limited to breakdown of skin: Secondary | ICD-10-CM
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
## 2023-07-10 ENCOUNTER — Ambulatory Visit: Payer: Medicaid Other

## 2023-07-10 ENCOUNTER — Other Ambulatory Visit: Payer: Self-pay | Admitting: Orthopedic Surgery

## 2023-07-26 ENCOUNTER — Ambulatory Visit: Payer: Medicaid Other | Admitting: Orthopedic Surgery

## 2023-07-30 ENCOUNTER — Ambulatory Visit (INDEPENDENT_AMBULATORY_CARE_PROVIDER_SITE_OTHER): Payer: Medicaid Other | Admitting: Orthopedic Surgery

## 2023-07-30 DIAGNOSIS — M545 Low back pain, unspecified: Secondary | ICD-10-CM | POA: Diagnosis not present

## 2023-07-30 DIAGNOSIS — M79672 Pain in left foot: Secondary | ICD-10-CM | POA: Diagnosis not present

## 2023-07-30 DIAGNOSIS — M25562 Pain in left knee: Secondary | ICD-10-CM | POA: Diagnosis not present

## 2023-07-30 DIAGNOSIS — G8929 Other chronic pain: Secondary | ICD-10-CM | POA: Diagnosis not present

## 2023-07-30 MED ORDER — COLCHICINE 0.6 MG PO TABS: 0.6000 mg | ORAL_TABLET | Freq: Every day | ORAL | 3 refills | Status: DC

## 2023-07-31 ENCOUNTER — Encounter: Payer: Self-pay | Admitting: Orthopedic Surgery
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
## 2023-08-27 ENCOUNTER — Ambulatory Visit: Payer: Medicaid Other | Admitting: Orthopedic Surgery

## 2023-09-02 ENCOUNTER — Emergency Department (HOSPITAL_COMMUNITY)
Admission: EM | Admit: 2023-09-02 | Discharge: 2023-09-02 | Disposition: A | Discharge status: Home / Self Care | Payer: Medicaid Other | Attending: Emergency Medicine | Admitting: Emergency Medicine

## 2023-09-02 ENCOUNTER — Encounter (HOSPITAL_COMMUNITY): Payer: Self-pay

## 2023-09-02 ENCOUNTER — Emergency Department (HOSPITAL_COMMUNITY): Payer: Medicaid Other

## 2023-09-02 ENCOUNTER — Other Ambulatory Visit: Payer: Self-pay

## 2023-09-02 DIAGNOSIS — Y792 Prosthetic and other implants, materials and accessory orthopedic devices associated with adverse incidents: Secondary | ICD-10-CM | POA: Insufficient documentation

## 2023-09-02 DIAGNOSIS — W010XXA Fall on same level from slipping, tripping and stumbling without subsequent striking against object, initial encounter: Secondary | ICD-10-CM | POA: Diagnosis not present

## 2023-09-02 DIAGNOSIS — T84038A Mechanical loosening of other internal prosthetic joint, initial encounter: Secondary | ICD-10-CM | POA: Insufficient documentation

## 2023-09-02 DIAGNOSIS — W19XXXA Unspecified fall, initial encounter: Secondary | ICD-10-CM

## 2023-09-02 DIAGNOSIS — M5442 Lumbago with sciatica, left side: Secondary | ICD-10-CM | POA: Insufficient documentation

## 2023-09-02 DIAGNOSIS — T84498A Other mechanical complication of other internal orthopedic devices, implants and grafts, initial encounter: Secondary | ICD-10-CM

## 2023-09-02 DIAGNOSIS — M545 Low back pain, unspecified: Secondary | ICD-10-CM | POA: Diagnosis present

## 2023-09-02 DIAGNOSIS — Z96698 Presence of other orthopedic joint implants: Secondary | ICD-10-CM | POA: Insufficient documentation

## 2023-09-02 MED ORDER — OXYCODONE-ACETAMINOPHEN 5-325 MG PO TABS
1.0000 | ORAL_TABLET | Freq: Once | ORAL | Status: AC
  Administered 2023-09-02: 1 via ORAL
  Filled 2023-09-02: qty 1

## 2023-09-02 MED ORDER — CYCLOBENZAPRINE HCL 10 MG PO TABS
10.0000 mg | ORAL_TABLET | Freq: Once | ORAL | Status: AC
Start: 2023-09-02 — End: 2023-09-02
  Administered 2023-09-02: 10 mg via ORAL
  Filled 2023-09-02: qty 1

## 2023-09-02 MED ORDER — IBUPROFEN 400 MG PO TABS
600.0000 mg | ORAL_TABLET | Freq: Once | ORAL | Status: AC
  Administered 2023-09-02: 600 mg via ORAL
  Filled 2023-09-02: qty 1

## 2023-09-02 MED ORDER — LIDOCAINE 5 % EX PTCH
1.0000 | MEDICATED_PATCH | Freq: Once | CUTANEOUS | Status: DC
  Administered 2023-09-02: 1 via TRANSDERMAL
  Filled 2023-09-02: qty 1

## 2023-09-02 MED ORDER — HYDROMORPHONE HCL 1 MG/ML IJ SOLN
0.5000 mg | Freq: Once | INTRAMUSCULAR | Status: AC
  Administered 2023-09-02: 0.5 mg via INTRAMUSCULAR
  Filled 2023-09-02: qty 1

## 2023-09-02 NOTE — Discharge Instructions (Addendum)
You were seen in the emergency department after your fall.  You had no broken bones but you did have appearance of loosening of your hardware in your back.  You should follow-up with Dr. Dutch Quint in the next few days to have your back and this reassessed.  You can also follow-up with your pain management doctor on Wednesday as scheduled to see if you need any adjustments to your pain medication plan.  You should return to the emergency department if you develop numbness or weakness in your legs and you are unable to walk, you are unable to urinate or if you have any other new or concerning symptoms.

## 2023-09-02 NOTE — ED Triage Notes (Signed)
Pt c.o lower back pain s/p fall this morning, states her legs gave out.

## 2023-09-02 NOTE — ED Notes (Signed)
Assisted pt with bedpan, linens changed

## 2023-09-02 NOTE — ED Notes (Signed)
Patient transported to X-ray 

## 2023-09-02 NOTE — ED Notes (Signed)
Pt denies numbness or tingling. Pt denies loss of bowel or bladder. Pt has 2+ posterior tibial pulse bilat, cap refill less than 3 sec bilat, able to wiggle toes, warm to touch.
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
## 2023-09-02 NOTE — ED Provider Notes (Signed)
[place] EMERGENCY DEPARTMENT AT [facility] Provider Note   CSN: [id number] Arrival date & time: 09/02/23  1134     History  Chief Complaint  Patient presents with   Back Pain   Fall    Nancy Bowers is a 47 y.o. female.  Patient is a 47 year old female with a past medical history of chronic back pain on chronic Percocet, bipolar and ADD presenting to the emergency department after a fall.  The patient states that she was getting out of bed this morning when she slipped and fell and hit her low back.  She states that she tried to get herself back into bed but the pain was too bad that she decided to come to the emergency department.  She states that she feels generally weak all over but denies any focal numbness or weakness after the fall.  She states that the pain is radiating down her left leg.  She denies any associated numbness, saddle anesthesia, loss of bowel or bladder function.  She states that she did recently have an x-ray of her back about 1 month ago that did show degenerative changes and she is currently following with pain management.  States that she last took a Percocet around 4 AM but has not taken anything since the fall.  The history is provided by the patient and the spouse.  Back Pain Fall       Home Medications Prior to Admission medications   Medication Sig Start Date End Date Taking? Authorizing Provider  albuterol (PROVENTIL HFA;VENTOLIN HFA) 108 (90 Base) MCG/ACT inhaler Inhale 1-2 puffs into the lungs every 4 (four) hours as needed for wheezing or shortness of breath.    [provider]  allopurinol (ZYLOPRIM) 100 MG tablet TAKE 1 TABLET (100 MG TOTAL) BY MOUTH DAILY. 07/10/23   Nadara Mustard, MD  ALPRAZolam Prudy Feeler) 0.5 MG tablet TAKE 1/2 TABLET BY MOUTH DAILY, AS NEEDED 01/25/22   [provider]  amphetamine-dextroamphetamine (ADDERALL) 30 MG tablet Take 1 tablet by mouth 2 (two) times daily. 02/24/21 02/24/22  Oletta Darter, MD  amphetamine-dextroamphetamine (ADDERALL) 30 MG tablet Take 1 tablet by mouth 2 (two) times daily. 02/24/21 02/24/22  Oletta Darter, MD  amphetamine-dextroamphetamine (ADDERALL) 30 MG tablet Take 1 tablet by mouth 2 (two) times daily. 02/24/21 02/24/22  Oletta Darter, MD  Ascorbic Acid (VITAMIN C) 1000 MG tablet Take 1,000 mg by mouth daily.    [provider]  cetirizine (ZYRTEC) 10 MG tablet Take 10 mg by mouth daily.    [provider]  colchicine 0.6 MG tablet Take 1 tablet (0.6 mg total) by mouth daily. 07/30/23   Nadara Mustard, MD  desvenlafaxine (PRISTIQ) 100 MG 24 hr tablet Take 1 tablet (100 mg total) by mouth daily. 02/24/21   Oletta Darter, MD  doxycycline (VIBRAMYCIN) 100 MG capsule Take 1 capsule (100 mg total) by mouth 2 (two) times daily. 05/14/22   Redwine, Madison A, PA-C  Ferrous Sulfate (IRON) 28 MG TABS Take 28 mg by mouth daily.    [provider]  gabapentin (NEURONTIN) 600 MG tablet Take 600 mg by mouth 3 (three) times daily.     [provider]  lamoTRIgine (LAMICTAL) 100 MG tablet Take 3 tablets (300 mg total) by mouth daily. 02/24/21   Oletta Darter, MD  methocarbamol (ROBAXIN) 750 MG tablet Take 750 mg by mouth 4 (four) times daily as needed. 01/12/22   [provider]  oxyCODONE-acetaminophen (PERCOCET) 5-325 MG  tablet Take 1 tablet by mouth every 6 (six) hours as needed for severe pain.    [provider]  tiZANidine (ZANAFLEX) 4 MG tablet Take 6 mg by mouth every 8 (eight) hours as needed for muscle spasms.    [provider]  triamcinolone cream (KENALOG) 0.1 % Apply 1 application topically 2 (two) times daily. Patient not taking: Reported on 04/02/2022 07/29/21   Wallis Bamberg, PA-C      Allergies    Aspirin, Bee venom, Fentanyl, and Tylenol [acetaminophen]    Review of Systems   Review of Systems  Musculoskeletal:  Positive for back pain.    Physical Exam Updated Vital Signs BP (!) 120/96    Pulse 93   Temp 98.5 F (36.9 C)   Resp 18   SpO2 98%  Physical Exam Vitals and nursing note reviewed.  Constitutional:      General: She is not in acute distress.    Appearance: Normal appearance.  HENT:     Head: Normocephalic and atraumatic.     Nose: Nose normal.     Mouth/Throat:     Mouth: Mucous membranes are moist.  Eyes:     Extraocular Movements: Extraocular movements intact.     Conjunctiva/sclera: Conjunctivae normal.  Neck:     Comments: No midline neck tenderness Cardiovascular:     Rate and Rhythm: Normal rate and regular rhythm.     Heart sounds: Normal heart sounds.  Pulmonary:     Effort: Pulmonary effort is normal.     Breath sounds: Normal breath sounds.  Abdominal:     General: Abdomen is flat.     Palpations: Abdomen is soft.     Tenderness: There is no abdominal tenderness.  Musculoskeletal:        General: Normal range of motion.     Cervical back: Normal range of motion and neck supple.     Comments: Diffuse tenderness to back, worse in the lumbar spine Positive straight leg raise on the left No bony tenderness to bilateral upper or lower extremities  Skin:    General: Skin is warm and dry.  Neurological:     General: No focal deficit present.     Mental Status: She is alert and oriented to person, place, and time.     Sensory: No sensory deficit.     Motor: No weakness.  Psychiatric:        Mood and Affect: Mood normal.        Behavior: Behavior normal.     ED Results / Procedures / Treatments   Labs (all labs ordered are listed, but only abnormal results are displayed) Labs Reviewed - No data to display   EKG None  Radiology DG Lumbar Spine Complete Result Date: 09/02/2023 CLINICAL DATA:  Fall, back pain EXAM: LUMBAR SPINE - COMPLETE 4+ VIEW COMPARISON:  01/25/2023 FINDINGS: There is no evidence of lumbar spine fracture. Mild levocurvature. Trace retrolisthesis at L3-4. Prior L4-S1 posterior and interbody fusion. There is  perihardware lucency associated with the L4 and L5 transpedicular screws. No definite perihardware lucency associated with the S1 screws. 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 IMPRESSION: 1. No acute fracture or traumatic listhesis of the lumbar spine. 2. Prior L4-S1 posterior and interbody fusion with perihardware lucency associated with the L4 and L5 transpedicular screws, compatible with hardware loosening. 3. Adjacent segment disease at L3-L4. Electronically Signed   By: Duanne Guess D.O.   On: 09/02/2023 15:00    Procedures Procedures  Medications Ordered in ED Medications  lidocaine (LIDODERM) 5 % 1-3 patch (1 patch Transdermal Patch Applied 09/02/23 1329)  HYDROmorphone (DILAUDID) injection 0.5 mg (has no administration in time range)  ibuprofen (ADVIL) tablet 600 mg (600 mg Oral Given 09/02/23 1329)  cyclobenzaprine (FLEXERIL) tablet 10 mg (10 mg Oral Given 09/02/23 1329)  oxyCODONE-acetaminophen (PERCOCET/ROXICET) 5-325 MG per tablet 1 tablet (1 tablet Oral Given 09/02/23 1329)  oxyCODONE-acetaminophen (PERCOCET/ROXICET) 5-325 MG per tablet 1 tablet (1 tablet Oral Given 09/02/23 1501)    ED Course/ Medical Decision Making/ A&P Clinical Course as of 09/02/23 1528  Sun Sep 02, 2023  1515 XR with evidence of hardware loosening, no fracture. Will consult neurosurgery. Patient states she would prefer to be discharged home rather than SNF if that is what is recommended by neurosurgery. [VK]  1520 I spoke with neurosurgery who recommended outpatient follow up with Dr. Jordan Likes. No specific interventions. [VK]    Clinical Course User Index [VK] Rexford Maus, DO                                 Medical Decision Making This patient presents to the ED with chief complaint(s) of back pain with pertinent past medical history of chronic back pain, bipolar disorder, ADD which further complicates the presenting complaint. The complaint involves an extensive differential diagnosis and also  carries with it a high risk of complications and morbidity.    The differential diagnosis includes considering fracture, contusion, sciatica, muscle strain, no focal neurologic deficits, no saddle anesthesia or loss of bowel or bladder function making cauda equina unlikely  Additional history obtained: Additional history obtained from spouse Records reviewed outpatient neurosurgery records  ED Course and Reassessment: On patient's arrival she is hemodynamically stable in no acute distress.  Is having significant back pain and will of x-ray performed to evaluate for fracture.  She was given pain control with Percocet, Flexeril, Motrin and lidocaine patch and will be closely reassessed.  No other traumatic injury seen on exam.  Independent labs interpretation:  N/A  Independent visualization of imaging: - I independently visualized the following imaging with scope of interpretation limited to determining acute life threatening conditions related to emergency care: lumbar spine XR, which revealed no fracture, hardware loosening  Consultation: - Consulted or discussed management/test interpretation w/ external professional: neurosurgery  Consideration for admission or further workup: Patient has no emergent conditions requiring admission or further work-up at this time and is stable for discharge home with neurosurgery follow-up  Social Determinants of health: N/A    Amount and/or Complexity of Data Reviewed Labs: ordered. Radiology: ordered.  Risk Prescription drug management.          Final Clinical Impression(s) / ED Diagnoses Final diagnoses:  Fall, initial encounter  Acute bilateral low back pain with left-sided sciatica  Loosening of hardware in spine [facility])    Rx / DC Orders ED Discharge Orders     None         Rexford Maus, DO 09/02/23 1528

## 2023-10-05 ENCOUNTER — Other Ambulatory Visit: Payer: Self-pay | Admitting: Student

## 2023-10-05 DIAGNOSIS — Z981 Arthrodesis status: Secondary | ICD-10-CM

## 2023-10-25 ENCOUNTER — Ambulatory Visit
Admission: RE | Admit: 2023-10-25 | Discharge: 2023-10-25 | Disposition: A | Discharge status: Home / Self Care | Payer: Medicaid Other | Source: Ambulatory Visit | Attending: Student | Admitting: Student

## 2023-10-25 DIAGNOSIS — Z981 Arthrodesis status: Secondary | ICD-10-CM

## 2023-10-29 ENCOUNTER — Ambulatory Visit (INDEPENDENT_AMBULATORY_CARE_PROVIDER_SITE_OTHER): Payer: Medicaid Other | Admitting: Orthopedic Surgery

## 2023-10-29 DIAGNOSIS — L97521 Non-pressure chronic ulcer of other part of left foot limited to breakdown of skin: Secondary | ICD-10-CM | POA: Diagnosis not present

## 2023-10-29 DIAGNOSIS — M21372 Foot drop, left foot: Secondary | ICD-10-CM | POA: Diagnosis not present

## 2023-11-06 ENCOUNTER — Encounter: Payer: Self-pay | Admitting: Orthopedic Surgery
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
## 2023-11-07 ENCOUNTER — Other Ambulatory Visit: Payer: Self-pay | Admitting: Orthopedic Surgery

## 2023-11-19 ENCOUNTER — Other Ambulatory Visit: Payer: Self-pay | Admitting: Neurosurgery

## 2023-11-20 ENCOUNTER — Other Ambulatory Visit: Payer: Self-pay | Admitting: Orthopedic Surgery

## 2023-11-24 ENCOUNTER — Encounter (HOSPITAL_COMMUNITY): Payer: Self-pay

## 2023-11-24 ENCOUNTER — Emergency Department (HOSPITAL_COMMUNITY)

## 2023-11-24 ENCOUNTER — Other Ambulatory Visit: Payer: Self-pay

## 2023-11-24 ENCOUNTER — Emergency Department (HOSPITAL_COMMUNITY)
Admission: EM | Admit: 2023-11-24 | Discharge: 2023-11-24 | Disposition: A | Discharge status: Home / Self Care | Attending: Emergency Medicine | Admitting: Emergency Medicine

## 2023-11-24 DIAGNOSIS — W01198A Fall on same level from slipping, tripping and stumbling with subsequent striking against other object, initial encounter: Secondary | ICD-10-CM | POA: Insufficient documentation

## 2023-11-24 DIAGNOSIS — S7001XA Contusion of right hip, initial encounter: Secondary | ICD-10-CM | POA: Insufficient documentation

## 2023-11-24 DIAGNOSIS — M545 Low back pain, unspecified: Secondary | ICD-10-CM | POA: Diagnosis not present

## 2023-11-24 DIAGNOSIS — S79911A Unspecified injury of right hip, initial encounter: Secondary | ICD-10-CM | POA: Diagnosis present

## 2023-11-24 MED ORDER — HYDROMORPHONE HCL 1 MG/ML IJ SOLN
1.0000 mg | Freq: Once | INTRAMUSCULAR | Status: AC
  Administered 2023-11-24: 1 mg via INTRAMUSCULAR
  Filled 2023-11-24: qty 1

## 2023-11-24 MED ORDER — OXYCODONE-ACETAMINOPHEN 5-325 MG PO TABS
1.0000 | ORAL_TABLET | Freq: Once | ORAL | Status: AC
  Administered 2023-11-24: 1 via ORAL
  Filled 2023-11-24: qty 1

## 2023-11-24 MED ORDER — MORPHINE SULFATE (PF) 2 MG/ML IV SOLN: 2.0000 mg | Freq: Once | INTRAVENOUS | Status: DC

## 2023-11-24 NOTE — ED Notes (Signed)
..  The patient is A&OX4, wheeled out of ED via wheelchair. Pt verbalized understanding of d/c instructions and follow up care.

## 2023-11-24 NOTE — Discharge Instructions (Signed)
 Follow up with your Physician for recheck.  Return if any problems.

## 2023-11-24 NOTE — ED Triage Notes (Signed)
 Pt states she got out of her bed and her knee gave out and she fell on right side today. Pt c/o hip pain bilat, lower back. Pt c/o numbness and tingling in hands bilat, legs bilat and back. Pt denies blood thinners. Pt states she hit her head. Pt denies LOC.

## 2023-11-24 NOTE — ED Provider Notes (Signed)
 [place] EMERGENCY DEPARTMENT AT [facility] Provider Note   CSN: [id number] Arrival date & time: 11/24/23  1428     History  No chief complaint on file.   Nancy Bowers is a 48 y.o. female.  Patient reports that she tried to stand up from bed and fell.  Patient reports that she struck her hip.  Patient complains of having pain in her hip and her low back.  Patient reports that she falls frequently.  Patient reports very limited standing.  Patient states that she normally is in the bed but gets up for a bedside potty.  Patient is here with family who states that patient does not walk.  Patient has a past medical history of multiple cervical and lumbar surgeries.  She is scheduled for a lumbar fusion March 21 with Dr. Dutch Quint.  Patient states that she is having trouble getting comfortable due to pain in her right hip.  The history is provided by the patient and the spouse. No language interpreter was used.       Home Medications Prior to Admission medications   Medication Sig Start Date End Date Taking? Authorizing Provider  albuterol (PROVENTIL HFA;VENTOLIN HFA) 108 (90 Base) MCG/ACT inhaler Inhale 1-2 puffs into the lungs every 4 (four) hours as needed for wheezing or shortness of breath.    [provider]  allopurinol (ZYLOPRIM) 100 MG tablet TAKE 1 TABLET (100 MG TOTAL) BY MOUTH DAILY. 11/20/23   Adonis Huguenin, NP  ALPRAZolam Prudy Feeler) 0.5 MG tablet TAKE 1/2 TABLET BY MOUTH DAILY, AS NEEDED 01/25/22   [provider]  amphetamine-dextroamphetamine (ADDERALL) 30 MG tablet Take 1 tablet by mouth 2 (two) times daily. 02/24/21 02/24/22  Oletta Darter, MD  amphetamine-dextroamphetamine (ADDERALL) 30 MG tablet Take 1 tablet by mouth 2 (two) times daily. 02/24/21 02/24/22  Oletta Darter, MD  amphetamine-dextroamphetamine (ADDERALL) 30 MG tablet Take 1 tablet by mouth 2 (two) times daily. 02/24/21 02/24/22  Oletta Darter, MD  Ascorbic Acid (VITAMIN C) 1000 MG tablet Take  1,000 mg by mouth daily.    [provider]  cetirizine (ZYRTEC) 10 MG tablet Take 10 mg by mouth daily.    [provider]  colchicine 0.6 MG tablet TAKE 1 TABLET (0.6 MG TOTAL) BY MOUTH DAILY. 11/07/23   Nadara Mustard, MD  desvenlafaxine (PRISTIQ) 100 MG 24 hr tablet Take 1 tablet (100 mg total) by mouth daily. 02/24/21   Oletta Darter, MD  doxycycline (VIBRAMYCIN) 100 MG capsule Take 1 capsule (100 mg total) by mouth 2 (two) times daily. 05/14/22   Redwine, Madison A, PA-C  Ferrous Sulfate (IRON) 28 MG TABS Take 28 mg by mouth daily.    [provider]  gabapentin (NEURONTIN) 600 MG tablet Take 600 mg by mouth 3 (three) times daily.     [provider]  lamoTRIgine (LAMICTAL) 100 MG tablet Take 3 tablets (300 mg total) by mouth daily. 02/24/21   Oletta Darter, MD  methocarbamol (ROBAXIN) 750 MG tablet Take 750 mg by mouth 4 (four) times daily as needed. 01/12/22   [provider]  oxyCODONE-acetaminophen (PERCOCET) 5-325 MG tablet Take 1 tablet by mouth every 6 (six) hours as needed for severe pain.    [provider]  tiZANidine (ZANAFLEX) 4 MG tablet Take 6 mg by mouth every 8 (eight) hours as needed for muscle spasms.    [provider]  triamcinolone cream (KENALOG) 0.1 % Apply 1 application topically 2 (two) times daily. Patient  not taking: Reported on 04/02/2022 07/29/21   Wallis Bamberg, PA-C      Allergies    Aspirin, Bee venom, Fentanyl, and Tylenol [acetaminophen]    Review of Systems   Review of Systems  Musculoskeletal:  Positive for arthralgias and back pain.  All other systems reviewed and are negative.   Physical Exam Updated Vital Signs BP (!) 161/108 (BP Location: Right Arm)   Pulse 86   Temp 98.7 F (37.1 C)   Resp 18   Ht 5\' 5"  (1.651 m)   Wt 70.3 kg   SpO2 96%   BMI 25.79 kg/m  Physical Exam Vitals and nursing note reviewed.  Constitutional:      Appearance: She is well-developed.  HENT:     Head:  Normocephalic.  Cardiovascular:     Rate and Rhythm: Normal rate.  Pulmonary:     Effort: Pulmonary effort is normal.  Abdominal:     General: There is no distension.  Musculoskeletal:        General: Swelling and tenderness present.  Skin:    General: Skin is warm.  Neurological:     General: No focal deficit present.     Mental Status: She is alert and oriented to person, place, and time.     ED Results / Procedures / Treatments   Labs (all labs ordered are listed, but only abnormal results are displayed) Labs Reviewed - No data to display  EKG None  Radiology DG Cervical Spine Complete Result Date: 11/24/2023 CLINICAL DATA:  Neck pain after fall today. EXAM: CERVICAL SPINE - COMPLETE 4+ VIEW COMPARISON:  July 12, 2022. FINDINGS: Status post surgical anterior fusion of C4-5, C5-6 and C6-7. No fracture or spondylolisthesis is noted. No prevertebral soft tissue swelling is noted. IMPRESSION: Postsurgical changes as noted above.  No acute abnormality seen. Electronically Signed   By: Lupita Raider M.D.   On: 11/24/2023 16:03   DG Hip Unilat W or Wo Pelvis 2-3 Views Right Result Date: 11/24/2023 CLINICAL DATA:  Right hip pain after fall today. EXAM: DG HIP (WITH OR WITHOUT PELVIS) 2-3V RIGHT COMPARISON:  None Available. FINDINGS: There is no evidence of hip fracture or dislocation. There is no evidence of arthropathy or other focal bone abnormality. IMPRESSION: Negative. Electronically Signed   By: Lupita Raider M.D.   On: 11/24/2023 16:01   DG Lumbar Spine Complete Result Date: 11/24/2023 CLINICAL DATA:  Lower back pain after fall today. EXAM: LUMBAR SPINE - COMPLETE 4+ VIEW COMPARISON:  September 02, 2023. FINDINGS: Status post surgical posterior fusion of L4-5 and L5-S1 with bilateral intrapedicular screw placement and interbody fusion. Mild grade 1 retrolisthesis of L3-4 is noted secondary to severe degenerative disc disease at this level. No fracture is noted. IMPRESSION:  Postsurgical and degenerative changes as noted above. No acute abnormality seen. Electronically Signed   By: Lupita Raider M.D.   On: 11/24/2023 16:00    Procedures Procedures    Medications Ordered in ED Medications  oxyCODONE-acetaminophen (PERCOCET/ROXICET) 5-325 MG per tablet 1 tablet (1 tablet Oral Given 11/24/23 1518)  HYDROmorphone (DILAUDID) injection 1 mg (1 mg Intramuscular Given 11/24/23 1613)    ED Course/ Medical Decision Making/ A&P                                 Medical Decision Making Patient reports that she has weakness in her extremities and does not ambulate.  She  does stand to use a bedside commode.  Patient reports she attempted to stand today and fell striking her hip.  Risk Prescription drug management. Risk Details: Patient given oxycodone at triage.  She reports no relief from the pain.  Patient requested a shot of Dilaudid.  Patient is given 1 mg of Dilaudid.  Patient's x-rays returned x-ray LS spine and right hip show no fracture.  Patient is counseled on the results.  Patient is advised to continue her home pain medication.           Final Clinical Impression(s) / ED Diagnoses Final diagnoses:  Contusion of right hip, initial encounter    Rx / DC Orders ED Discharge Orders     None      An After Visit Summary was printed and given to the patient.    Elson Areas, PA-C 11/24/23 1702    Rozelle Logan, DO 11/24/23 2143

## 2023-11-24 NOTE — ED Notes (Signed)
Pt being brought to room by xray

## 2023-11-24 NOTE — ED Provider Triage Note (Signed)
 Emergency Medicine Provider Triage Evaluation Note  Nancy Bowers , a 48 y.o. female  was evaluated in triage.  Pt complains of fall.  Review of Systems  Positive: Low back pain, right hip pain, neck pain Negative: LOC  Physical Exam  BP (!) 163/128 (BP Location: Right Arm)   Pulse 96   Temp 98.7 F (37.1 C)   Resp (!) 22   Ht 5\' 5"  (1.651 m)   Wt 70.3 kg   SpO2 100%   BMI 25.79 kg/m  Gen:   Awake, no distress   Resp:  Normal effort  MSK:   Moves extremities without difficulty  Other:    Medical Decision Making  Medically screening exam initiated at 2:53 PM.  Appropriate orders placed.  KRISSI WILLAIMS was informed that the remainder of the evaluation will be completed by another provider, this initial triage assessment does not replace that evaluation, and the importance of remaining in the ED until their evaluation is complete.     Maxwell Marion, PA-C 11/24/23 1455

## 2023-11-28 NOTE — Pre-Procedure Instructions (Signed)
 Surgical Instructions   Your procedure is scheduled on December 07, 2023. Report to [facility] Main Entrance "A" at 7:50 A.M., then check in with the Admitting office. Any questions or running late day of surgery: call [phone]  Questions prior to your surgery date: call [phone], Monday-Friday, 8am-4pm. If you experience any cold or flu symptoms such as cough, fever, chills, shortness of breath, etc. between now and your scheduled surgery, please notify us at the above number.     Remember:  Do not eat or drink after midnight the night before your surgery    Take these medicines the morning of surgery with A SIP OF WATER: allopurinol (ZYLOPRIM)  clonazePAM (KLONOPIN)  colchicine  famotidine (PEPCID)  Fluticasone-Umeclidin-Vilant (TRELEGY ELLIPTA) inhaler gabapentin (NEURONTIN)  omeprazole (PRILOSEC)    May take these medicines IF NEEDED: albuterol (PROVENTIL HFA;VENTOLIN HFA) inhaler - please bring inhaler with you morning of surgery fluticasone (FLONASE) nasal spray  oxyCODONE-acetaminophen (PERCOCET)    One week prior to surgery, STOP taking any Aspirin (unless otherwise instructed by your surgeon) Aleve, Naproxen, Ibuprofen, Motrin, Advil, Goody's, BC's, all herbal medications, fish oil, and non-prescription vitamins. This includes your medication: meloxicam (MOBIC)                      Do NOT Smoke (Tobacco/Vaping) for 24 hours prior to your procedure.  If you use a CPAP at night, you may bring your mask/headgear for your overnight stay.   You will be asked to remove any contacts, glasses, piercing's, hearing aid's, dentures/partials prior to surgery. Please bring cases for these items if needed.    Patients discharged the day of surgery will not be allowed to drive home, and someone needs to stay with them for 24 hours.  SURGICAL WAITING ROOM VISITATION Patients may have no more than 2 support people in the waiting area - these visitors may rotate.   Pre-op  nurse will coordinate an appropriate time for 1 ADULT support person, who may not rotate, to accompany patient in pre-op.  Children under the age of 25 must have an adult with them who is not the patient and must remain in the main waiting area with an adult.  If the patient needs to stay at the hospital during part of their recovery, the visitor guidelines for inpatient rooms apply.  Please refer to the [facility] website for the visitor guidelines for any additional information.   If you received a COVID test during your pre-op visit  it is requested that you wear a mask when out in public, stay away from anyone that may not be feeling well and notify your surgeon if you develop symptoms. If you have been in contact with anyone that has tested positive in the last 10 days please notify you surgeon.      Pre-operative 5 CHG Bathing Instructions   You can play a key role in reducing the risk of infection after surgery. Your skin needs to be as free of germs as possible. You can reduce the number of germs on your skin by washing with CHG (chlorhexidine gluconate) soap before surgery. CHG is an antiseptic soap that kills germs and continues to kill germs even after washing.   DO NOT use if you have an allergy to chlorhexidine/CHG or antibacterial soaps. If your skin becomes reddened or irritated, stop using the CHG and notify one of our RNs at [phone].   Please shower with the CHG soap starting 4 days before surgery  using the following schedule:     Please keep in mind the following:  DO NOT shave, including legs and underarms, starting the day of your first shower.   You may shave your face at any point before/day of surgery.  Place clean sheets on your bed the day you start using CHG soap. Use a clean washcloth (not used since being washed) for each shower. DO NOT sleep with pets once you start using the CHG.   CHG Shower Instructions:  Wash your face and private area with normal  soap. If you choose to wash your hair, wash first with your normal shampoo.  After you use shampoo/soap, rinse your hair and body thoroughly to remove shampoo/soap residue.  Turn the water OFF and apply about 3 tablespoons (45 ml) of CHG soap to a CLEAN washcloth.  Apply CHG soap ONLY FROM YOUR NECK DOWN TO YOUR TOES (washing for 3-5 minutes)  DO NOT use CHG soap on face, private areas, open wounds, or sores.  Pay special attention to the area where your surgery is being performed.  If you are having back surgery, having someone wash your back for you may be helpful. Wait 2 minutes after CHG soap is applied, then you may rinse off the CHG soap.  Pat dry with a clean towel  Put on clean clothes/pajamas   If you choose to wear lotion, please use ONLY the CHG-compatible lotions that are listed below.  Additional instructions for the day of surgery: DO NOT APPLY any lotions, deodorants, cologne, or perfumes.   Do not bring valuables to the hospital. [facility] is not responsible for any belongings/valuables. Do not wear nail polish, gel polish, artificial nails, or any other type of covering on natural nails (fingers and toes) Do not wear jewelry or makeup Put on clean/comfortable clothes.  Please brush your teeth.  Ask your nurse before applying any prescription medications to the skin.     CHG Compatible Lotions   Aveeno Moisturizing lotion  Cetaphil Moisturizing Cream  Cetaphil Moisturizing Lotion  Clairol Herbal Essence Moisturizing Lotion, Dry Skin  Clairol Herbal Essence Moisturizing Lotion, Extra Dry Skin  Clairol Herbal Essence Moisturizing Lotion, Normal Skin  Curel Age Defying Therapeutic Moisturizing Lotion with Alpha Hydroxy  Curel Extreme Care Body Lotion  Curel Soothing Hands Moisturizing Hand Lotion  Curel Therapeutic Moisturizing Cream, Fragrance-Free  Curel Therapeutic Moisturizing Lotion, Fragrance-Free  Curel Therapeutic Moisturizing Lotion, Original Formula   Eucerin Daily Replenishing Lotion  Eucerin Dry Skin Therapy Plus Alpha Hydroxy Crme  Eucerin Dry Skin Therapy Plus Alpha Hydroxy Lotion  Eucerin Original Crme  Eucerin Original Lotion  Eucerin Plus Crme Eucerin Plus Lotion  Eucerin TriLipid Replenishing Lotion  Keri Anti-Bacterial Hand Lotion  Keri Deep Conditioning Original Lotion Dry Skin Formula Softly Scented  Keri Deep Conditioning Original Lotion, Fragrance Free Sensitive Skin Formula  Keri Lotion Fast Absorbing Fragrance Free Sensitive Skin Formula  Keri Lotion Fast Absorbing Softly Scented Dry Skin Formula  Keri Original Lotion  Keri Skin Renewal Lotion Keri Silky Smooth Lotion  Keri Silky Smooth Sensitive Skin Lotion  Nivea Body Creamy Conditioning Oil  Nivea Body Extra Enriched Lotion  Nivea Body Original Lotion  Nivea Body Sheer Moisturizing Lotion Nivea Crme  Nivea Skin Firming Lotion  NutraDerm 30 Skin Lotion  NutraDerm Skin Lotion  NutraDerm Therapeutic Skin Cream  NutraDerm Therapeutic Skin Lotion  ProShield Protective Hand Cream  Provon moisturizing lotion  Please read over the following fact sheets that you were given.

## 2023-11-29 ENCOUNTER — Encounter (HOSPITAL_COMMUNITY): Payer: Self-pay

## 2023-11-29 ENCOUNTER — Encounter (HOSPITAL_COMMUNITY)
Admission: RE | Admit: 2023-11-29 | Discharge: 2023-11-29 | Disposition: A | Discharge status: Home / Self Care | Source: Ambulatory Visit | Attending: Neurosurgery | Admitting: Neurosurgery

## 2023-11-29 ENCOUNTER — Other Ambulatory Visit: Payer: Self-pay

## 2023-11-29 VITALS — BP 131/92 | HR 96 | Temp 98.2°F | Resp 17 | Ht 64.0 in | Wt 180.0 lb

## 2023-11-29 DIAGNOSIS — Z01812 Encounter for preprocedural laboratory examination: Secondary | ICD-10-CM | POA: Diagnosis present

## 2023-11-29 DIAGNOSIS — K769 Liver disease, unspecified: Secondary | ICD-10-CM | POA: Diagnosis not present

## 2023-11-29 DIAGNOSIS — Z01818 Encounter for other preprocedural examination: Secondary | ICD-10-CM

## 2023-11-29 LAB — CBC
HCT: 41.3 % (ref 36.0–46.0)
Hemoglobin: 13.7 g/dL (ref 12.0–15.0)
MCH: 31.5 pg (ref 26.0–34.0)
MCHC: 33.2 g/dL (ref 30.0–36.0)
MCV: 94.9 fL (ref 80.0–100.0)
Platelets: 358 10*3/uL (ref 150–400)
RBC: 4.35 MIL/uL (ref 3.87–5.11)
RDW: 12.7 % (ref 11.5–15.5)
WBC: 7.9 10*3/uL (ref 4.0–10.5)
nRBC: 0 % (ref 0.0–0.2)

## 2023-11-29 LAB — COMPREHENSIVE METABOLIC PANEL
ALT: 17 U/L (ref 0–44)
AST: 17 U/L (ref 15–41)
Albumin: 3.5 g/dL (ref 3.5–5.0)
Alkaline Phosphatase: 106 U/L (ref 38–126)
Anion gap: 5 (ref 5–15)
BUN: 21 mg/dL — ABNORMAL HIGH (ref 6–20)
CO2: 29 mmol/L (ref 22–32)
Calcium: 8.7 mg/dL — ABNORMAL LOW (ref 8.9–10.3)
Chloride: 102 mmol/L (ref 98–111)
Creatinine, Ser: 0.81 mg/dL (ref 0.44–1.00)
GFR, Estimated: >60 mL/min (ref >60)
Glucose, Bld: 89 mg/dL (ref 70–99)
Potassium: 4.1 mmol/L (ref 3.5–5.1)
Sodium: 136 mmol/L (ref 135–145)
Total Bilirubin: 0.3 mg/dL (ref 0.0–1.2)
Total Protein: 6.4 g/dL — ABNORMAL LOW (ref 6.5–8.1)

## 2023-11-29 LAB — TYPE AND SCREEN
ABO/RH(D): O NEG
Antibody Screen: NEGATIVE

## 2023-11-29 LAB — SURGICAL PCR SCREEN
MRSA, PCR: NEGATIVE
Staphylococcus aureus: NEGATIVE

## 2023-11-29 NOTE — Progress Notes (Signed)
 PCP - Dr. Allena Katz at [facility] at [facility] - Dr. Rosita Kea - seen within the past year for CP work-up  PPM/ICD - Denies Device Orders - n/a Rep Notified - n/a  Chest x-ray - n/a EKG - Per pt, within past year - results requested Stress Test - Per pt, within past year - results requested ECHO - Per pt, within past year - results requested Cardiac Cath - n/a  Sleep Study - Denies CPAP - n/a  No DM  Last dose of GLP1 agonist- n/a GLP1 instructions: n/a  Blood Thinner Instructions: n/a Aspirin Instructions: n/a  ERAS Protcol - Clear liquids until 0650 morning of surgery PRE-SURGERY Ensure or G2- n/a  COVID TEST- n/a   Anesthesia review: Yes. Cardiac testing and last office note requested from [facility].   Patient denies shortness of breath, fever, cough and chest pain at PAT appointment. Pt denies any respiratory illness/infection in the last two months.   All instructions explained to the patient, with a verbal understanding of the material. Patient agrees to go over the instructions while at home for a better understanding. Patient also instructed to self quarantine after being tested for COVID-19. The opportunity to ask questions was provided.

## 2023-12-06 NOTE — Progress Notes (Signed)
 Anesthesia Chart Review:  49 year old female with pertinent history including current smoker with associated COPD maintained on Trelegy Ellipta, manic depression, OCD, asthma, GERD on pantoprazole and famotidine, migraines, anxiety, ADHD, prior C4-7 ACDF.  Recent evaluation with cardiologist Dr. Mercy Riding at [facility] for report of chest pain and palpitations.  Nuclear stress test 08/06/2023 showed no convincing evidence for significant inducible perfusion abnormality or prior infarction, EF 54%.  Echo 09/29/2022 showed LVEF ~70%, normal RV size and function, mildly elevated RVSP 36 mmHg, no significant valvular abnormalities.  24-hour event monitor 08/2022 showed 17 episodes of paroxysmal atrial tachycardia.  Patient is documented as having an abnormal EKG in [place] medical notes, however, no tracing was received.  Patient will need EKG on DOS.  Preop labs reviewed, unremarkable.  Nuclear stress 08/06/2023 [facility]): Within normal limits. 1.  No convincing evidence for significant inducible perfusion abnormality or prior infarction. 2.  The poststress ejection fraction is measured at 54%, with normal global and regional LV wall motion and contractility. 3.  There is an area of decreased activity in the anterior wall at rest and post-rest with normal segmental contractility, highly suggestive of breast attenuation.  If clinically indicated, correlation with noninvasive coronary CT angiography could be considered. 4.  Well-tolerated Lexiscan (regadenoson) myocardial perfusion study with normal symptomatic and ECG response to vasodilator stress.  TTE 09/29/2022 [facility]): Within normal limits. 1.  LV size, wall thickness, and systolic function are normal. 2.  Overall left ventricular systolic function is high normal; LVEF ~70%. 3.  The diastolic filling pattern is normal for the age of the patient. 4.  The left atrial size is normal; LA volume 25 mm/m. 5.  The right  ventricle is normal in size and function. 6.  Mild pulmonary arterial systolic hypertension; RVSP 36 mmHg. 7.  The inferior vena cava is normal, consistent with normal RA pressure. 8.  No evidence of hemodynamically significant valvular abnormality.    Zannie Cove [facility]/Anesthesiology Phone [phone] 12/06/2023 10:25 AM

## 2023-12-06 NOTE — Anesthesia Preprocedure Evaluation (Signed)
 Anesthesia Evaluation  Patient identified by MRN, date of birth, ID band Patient awake    Reviewed: Allergy & Precautions, NPO status , Patient's Chart, lab work & pertinent test results  Airway Mallampati: II  TM Distance: >3 FB Neck ROM: Full    Dental  (+) Chipped, Poor Dentition, Missing, Dental Advisory Given   Pulmonary asthma , COPD,  COPD inhaler, Current Smoker and Patient abstained from smoking.   Pulmonary exam normal breath sounds clear to auscultation       Cardiovascular negative cardio ROS Normal cardiovascular exam Rhythm:Regular Rate:Normal     Neuro/Psych  Headaches PSYCHIATRIC DISORDERS Anxiety Depression Bipolar Disorder      GI/Hepatic ,GERD  Medicated,,(+)     substance abuse    Endo/Other  negative endocrine ROS    Renal/GU negative Renal ROS  negative genitourinary   Musculoskeletal  (+) Arthritis  (s/p ACDF),  narcotic dependent  Abdominal   Peds  (+) ATTENTION DEFICIT DISORDER WITHOUT HYPERACTIVITY Hematology negative hematology ROS (+)   Anesthesia Other Findings   Reproductive/Obstetrics                             Anesthesia Physical Anesthesia Plan  ASA: 3  Anesthesia Plan: General   Post-op Pain Management: Tylenol PO (pre-op)*, Ketamine IV* and Dilaudid IV   Induction: Intravenous  PONV Risk Score and Plan: 2 and Midazolam, Dexamethasone and Ondansetron  Airway Management Planned: Oral ETT and Video Laryngoscope Planned  Additional Equipment:   Intra-op Plan:   Post-operative Plan: Extubation in OR  Informed Consent: I have reviewed the patients History and Physical, chart, labs and discussed the procedure including the risks, benefits and alternatives for the proposed anesthesia with the patient or authorized representative who has indicated his/her understanding and acceptance.     Dental advisory given  Plan Discussed with:  CRNA  Anesthesia Plan Comments: (PAT note by Antionette Poles, PA-C: 48 year old female with pertinent history including current smoker with associated COPD maintained on Trelegy Ellipta, manic depression, OCD, asthma, GERD on pantoprazole and famotidine, migraines, anxiety, ADHD, prior C4-7 ACDF.  Recent evaluation with cardiologist Dr. Mercy Riding at [facility] for report of chest pain and palpitations.  Nuclear stress test 08/06/2023 showed no convincing evidence for significant inducible perfusion abnormality or prior infarction, EF 54%.  Echo 09/29/2022 showed LVEF ~70%, normal RV size and function, mildly elevated RVSP 36 mmHg, no significant valvular abnormalities.  24-hour event monitor 08/2022 showed 17 episodes of paroxysmal atrial tachycardia.  Patient is documented as having an abnormal EKG in [place] medical notes, however, no tracing was received.  Patient will need EKG on DOS.  Preop labs reviewed, unremarkable.  Nuclear stress 08/06/2023 [facility]): Within normal limits. 1.  No convincing evidence for significant inducible perfusion abnormality or prior infarction. 2.  The poststress ejection fraction is measured at 54%, with normal global and regional LV wall motion and contractility. 3.  There is an area of decreased activity in the anterior wall at rest and post-rest with normal segmental contractility, highly suggestive of breast attenuation.  If clinically indicated, correlation with noninvasive coronary CT angiography could be considered. 4.  Well-tolerated Lexiscan (regadenoson) myocardial perfusion study with normal symptomatic and ECG response to vasodilator stress.  TTE 09/29/2022 [facility]): Within normal limits. 1.  LV size, wall thickness, and systolic function are normal. 2.  Overall left ventricular systolic function is high normal; LVEF ~70%. 3.  The diastolic filling pattern  is normal for the age of the patient. 4.  The left atrial size is normal;  LA volume 25 mm/m. 5.  The right ventricle is normal in size and function. 6.  Mild pulmonary arterial systolic hypertension; RVSP 36 mmHg. 7.  The inferior vena cava is normal, consistent with normal RA pressure. 8.  No evidence of hemodynamically significant valvular abnormality.  )        Anesthesia Quick Evaluation

## 2023-12-07 ENCOUNTER — Other Ambulatory Visit: Payer: Self-pay

## 2023-12-07 ENCOUNTER — Ambulatory Visit (HOSPITAL_COMMUNITY): Payer: Self-pay | Admitting: Physician Assistant

## 2023-12-07 ENCOUNTER — Observation Stay (HOSPITAL_COMMUNITY)
Admission: RE | Admit: 2023-12-07 | Discharge: 2023-12-08 | Disposition: A | Discharge status: Home Health | Source: Ambulatory Visit | Attending: Neurosurgery | Admitting: Neurosurgery

## 2023-12-07 ENCOUNTER — Ambulatory Visit (HOSPITAL_COMMUNITY)

## 2023-12-07 ENCOUNTER — Encounter (HOSPITAL_COMMUNITY): Payer: Self-pay | Admitting: Neurosurgery

## 2023-12-07 ENCOUNTER — Ambulatory Visit (HOSPITAL_BASED_OUTPATIENT_CLINIC_OR_DEPARTMENT_OTHER): Payer: Self-pay

## 2023-12-07 ENCOUNTER — Encounter (HOSPITAL_COMMUNITY)
Admission: RE | Disposition: A | Discharge status: Home Health | Payer: Self-pay | Source: Ambulatory Visit | Attending: Neurosurgery

## 2023-12-07 DIAGNOSIS — M48061 Spinal stenosis, lumbar region without neurogenic claudication: Secondary | ICD-10-CM | POA: Insufficient documentation

## 2023-12-07 DIAGNOSIS — M4316 Spondylolisthesis, lumbar region: Secondary | ICD-10-CM | POA: Diagnosis present

## 2023-12-07 DIAGNOSIS — I10 Essential (primary) hypertension: Secondary | ICD-10-CM | POA: Diagnosis not present

## 2023-12-07 DIAGNOSIS — J449 Chronic obstructive pulmonary disease, unspecified: Secondary | ICD-10-CM | POA: Diagnosis not present

## 2023-12-07 DIAGNOSIS — F17210 Nicotine dependence, cigarettes, uncomplicated: Secondary | ICD-10-CM

## 2023-12-07 DIAGNOSIS — M51369 Other intervertebral disc degeneration, lumbar region without mention of lumbar back pain or lower extremity pain: Principal | ICD-10-CM

## 2023-12-07 DIAGNOSIS — F418 Other specified anxiety disorders: Secondary | ICD-10-CM | POA: Diagnosis not present

## 2023-12-07 DIAGNOSIS — Z79899 Other long term (current) drug therapy: Secondary | ICD-10-CM | POA: Diagnosis not present

## 2023-12-07 LAB — POCT PREGNANCY, URINE: Preg Test, Ur: NEGATIVE

## 2023-12-07 SURGERY — POSTERIOR LUMBAR FUSION 1 LEVEL
Anesthesia: General | Site: Back

## 2023-12-07 MED ORDER — ALBUTEROL SULFATE HFA 108 (90 BASE) MCG/ACT IN AERS
INHALATION_SPRAY | RESPIRATORY_TRACT | Status: AC
  Filled 2023-12-07: qty 6.7

## 2023-12-07 MED ORDER — FLEET ENEMA RE ENEM: 1.0000 | ENEMA | Freq: Once | RECTAL | Status: DC | PRN

## 2023-12-07 MED ORDER — PHENOL 1.4 % MT LIQD: 1.0000 | OROMUCOSAL | Status: DC | PRN

## 2023-12-07 MED ORDER — CHLORHEXIDINE GLUCONATE CLOTH 2 % EX PADS: 6.0000 | MEDICATED_PAD | Freq: Once | CUTANEOUS | Status: DC

## 2023-12-07 MED ORDER — VITAMIN D (ERGOCALCIFEROL) 1.25 MG (50000 UNIT) PO CAPS
50000.0000 [IU] | ORAL_CAPSULE | ORAL | Status: DC
  Filled 2023-12-07: qty 1

## 2023-12-07 MED ORDER — METHOCARBAMOL 500 MG PO TABS
500.0000 mg | ORAL_TABLET | Freq: Four times a day (QID) | ORAL | Status: DC | PRN
  Administered 2023-12-07 – 2023-12-08 (×3): 500 mg via ORAL
  Filled 2023-12-07 (×3): qty 1

## 2023-12-07 MED ORDER — DEXMEDETOMIDINE HCL IN NACL 200 MCG/50ML IV SOLN
INTRAVENOUS | Status: DC | PRN
  Administered 2023-12-07: 20 ug via INTRAVENOUS

## 2023-12-07 MED ORDER — HYDROMORPHONE HCL 1 MG/ML IJ SOLN
INTRAMUSCULAR | Status: AC
Start: 2023-12-07 — End: ?
  Filled 2023-12-07: qty 0.5

## 2023-12-07 MED ORDER — HYDROMORPHONE HCL 1 MG/ML IJ SOLN
INTRAMUSCULAR | Status: AC
  Administered 2023-12-07: 0.5 mg via INTRAVENOUS
  Filled 2023-12-07: qty 2

## 2023-12-07 MED ORDER — HYDROMORPHONE HCL 1 MG/ML IJ SOLN
1.0000 mg | INTRAMUSCULAR | Status: DC | PRN
  Administered 2023-12-07 (×2): 1 mg via INTRAVENOUS
  Filled 2023-12-07 (×2): qty 1

## 2023-12-07 MED ORDER — ACETAMINOPHEN 500 MG PO TABS: 1000.0000 mg | ORAL_TABLET | Freq: Once | ORAL | Status: AC

## 2023-12-07 MED ORDER — DOCUSATE SODIUM 100 MG PO CAPS
100.0000 mg | ORAL_CAPSULE | Freq: Two times a day (BID) | ORAL | Status: DC | PRN
  Administered 2023-12-07: 100 mg via ORAL
  Filled 2023-12-07: qty 1

## 2023-12-07 MED ORDER — ROCURONIUM BROMIDE 10 MG/ML (PF) SYRINGE
PREFILLED_SYRINGE | INTRAVENOUS | Status: DC | PRN
  Administered 2023-12-07: 20 mg via INTRAVENOUS
  Administered 2023-12-07: 10 mg via INTRAVENOUS
  Administered 2023-12-07: 50 mg via INTRAVENOUS
  Administered 2023-12-07: 20 mg via INTRAVENOUS

## 2023-12-07 MED ORDER — 0.9 % SODIUM CHLORIDE (POUR BTL) OPTIME
TOPICAL | Status: DC | PRN
  Administered 2023-12-07: 1000 mL

## 2023-12-07 MED ORDER — DEXMEDETOMIDINE HCL IN NACL 80 MCG/20ML IV SOLN
INTRAVENOUS | Status: AC
  Filled 2023-12-07: qty 20

## 2023-12-07 MED ORDER — CLONAZEPAM 1 MG PO TABS
1.0000 mg | ORAL_TABLET | Freq: Two times a day (BID) | ORAL | Status: DC
  Administered 2023-12-07: 1 mg via ORAL
  Filled 2023-12-07 (×2): qty 1

## 2023-12-07 MED ORDER — CEFAZOLIN SODIUM-DEXTROSE 2-4 GM/100ML-% IV SOLN
INTRAVENOUS | Status: AC
  Filled 2023-12-07: qty 100

## 2023-12-07 MED ORDER — FLUTICASONE PROPIONATE 50 MCG/ACT NA SUSP
1.0000 | Freq: Every day | NASAL | Status: DC | PRN
Start: 2023-12-07 — End: 2023-12-08

## 2023-12-07 MED ORDER — ACETAMINOPHEN 650 MG RE SUPP: 650.0000 mg | RECTAL | Status: DC | PRN

## 2023-12-07 MED ORDER — METHOCARBAMOL 500 MG PO TABS
ORAL_TABLET | ORAL | Status: AC
  Administered 2023-12-07: 500 mg via ORAL
  Filled 2023-12-07: qty 1

## 2023-12-07 MED ORDER — CHLORHEXIDINE GLUCONATE 0.12 % MT SOLN: 15.0000 mL | Freq: Once | OROMUCOSAL | Status: AC

## 2023-12-07 MED ORDER — SUGAMMADEX SODIUM 200 MG/2ML IV SOLN
INTRAVENOUS | Status: DC | PRN
  Administered 2023-12-07: 350 mg via INTRAVENOUS

## 2023-12-07 MED ORDER — ALLOPURINOL 100 MG PO TABS
100.0000 mg | ORAL_TABLET | Freq: Every day | ORAL | Status: DC
  Filled 2023-12-07: qty 1

## 2023-12-07 MED ORDER — SENNA 8.6 MG PO TABS
1.0000 | ORAL_TABLET | Freq: Two times a day (BID) | ORAL | Status: DC | PRN
  Administered 2023-12-07: 8.6 mg via ORAL
  Filled 2023-12-07: qty 1

## 2023-12-07 MED ORDER — OXYCODONE HCL 5 MG PO TABS
ORAL_TABLET | ORAL | Status: AC
  Administered 2023-12-07: 5 mg via ORAL
  Filled 2023-12-07: qty 1

## 2023-12-07 MED ORDER — ONDANSETRON HCL 4 MG/2ML IJ SOLN: 4.0000 mg | Freq: Four times a day (QID) | INTRAMUSCULAR | Status: DC | PRN

## 2023-12-07 MED ORDER — PHENYLEPHRINE 80 MCG/ML (10ML) SYRINGE FOR IV PUSH (FOR BLOOD PRESSURE SUPPORT)
PREFILLED_SYRINGE | INTRAVENOUS | Status: DC | PRN
  Administered 2023-12-07 (×2): 80 ug via INTRAVENOUS
  Administered 2023-12-07: 240 ug via INTRAVENOUS

## 2023-12-07 MED ORDER — BUPIVACAINE HCL (PF) 0.25 % IJ SOLN
INTRAMUSCULAR | Status: DC | PRN
  Administered 2023-12-07: 30 mL

## 2023-12-07 MED ORDER — OXYCODONE HCL 5 MG PO TABS
10.0000 mg | ORAL_TABLET | ORAL | Status: DC | PRN
  Administered 2023-12-07 – 2023-12-08 (×5): 10 mg via ORAL
  Filled 2023-12-07 (×6): qty 2

## 2023-12-07 MED ORDER — ALBUTEROL SULFATE HFA 108 (90 BASE) MCG/ACT IN AERS
INHALATION_SPRAY | RESPIRATORY_TRACT | Status: DC | PRN
  Administered 2023-12-07 (×2): 2 via RESPIRATORY_TRACT

## 2023-12-07 MED ORDER — GABAPENTIN 400 MG PO CAPS
800.0000 mg | ORAL_CAPSULE | Freq: Two times a day (BID) | ORAL | Status: DC
  Administered 2023-12-07: 800 mg via ORAL
  Filled 2023-12-07 (×3): qty 2

## 2023-12-07 MED ORDER — ALBUMIN HUMAN 5 % IV SOLN: INTRAVENOUS | Status: DC | PRN

## 2023-12-07 MED ORDER — SODIUM CHLORIDE 0.9 % IV SOLN: 250.0000 mL | INTRAVENOUS | Status: DC

## 2023-12-07 MED ORDER — KETAMINE HCL 10 MG/ML IJ SOLN
INTRAMUSCULAR | Status: DC | PRN
  Administered 2023-12-07: 10 mg via INTRAVENOUS
  Administered 2023-12-07: 30 mg via INTRAVENOUS
  Administered 2023-12-07: 10 mg via INTRAVENOUS

## 2023-12-07 MED ORDER — AMPHETAMINE-DEXTROAMPHETAMINE 10 MG PO TABS
30.0000 mg | ORAL_TABLET | Freq: Two times a day (BID) | ORAL | Status: DC
  Filled 2023-12-07: qty 3

## 2023-12-07 MED ORDER — CHLORHEXIDINE GLUCONATE 0.12 % MT SOLN
OROMUCOSAL | Status: AC
  Administered 2023-12-07: 15 mL via OROMUCOSAL
  Filled 2023-12-07: qty 15

## 2023-12-07 MED ORDER — HYDROMORPHONE HCL 1 MG/ML IJ SOLN
0.2500 mg | INTRAMUSCULAR | Status: DC | PRN
  Administered 2023-12-07 (×3): 0.5 mg via INTRAVENOUS

## 2023-12-07 MED ORDER — PHENYLEPHRINE HCL-NACL 20-0.9 MG/250ML-% IV SOLN
INTRAVENOUS | Status: DC | PRN
  Administered 2023-12-07: 50 ug/min via INTRAVENOUS
  Administered 2023-12-07: 15 ug/min via INTRAVENOUS

## 2023-12-07 MED ORDER — FAMOTIDINE 20 MG PO TABS
20.0000 mg | ORAL_TABLET | Freq: Every day | ORAL | Status: DC
  Filled 2023-12-07: qty 1

## 2023-12-07 MED ORDER — FLUTICASONE FUROATE-VILANTEROL 200-25 MCG/ACT IN AEPB
1.0000 | INHALATION_SPRAY | Freq: Every day | RESPIRATORY_TRACT | Status: DC
  Administered 2023-12-08: 1 via RESPIRATORY_TRACT
  Filled 2023-12-07: qty 28

## 2023-12-07 MED ORDER — ORAL CARE MOUTH RINSE: 15.0000 mL | Freq: Once | OROMUCOSAL | Status: AC

## 2023-12-07 MED ORDER — SODIUM CHLORIDE 0.9% FLUSH: 3.0000 mL | INTRAVENOUS | Status: DC | PRN

## 2023-12-07 MED ORDER — VANCOMYCIN HCL 1000 MG IV SOLR
INTRAVENOUS | Status: AC
  Filled 2023-12-07: qty 20

## 2023-12-07 MED ORDER — CEFAZOLIN SODIUM-DEXTROSE 1-4 GM/50ML-% IV SOLN
1.0000 g | Freq: Three times a day (TID) | INTRAVENOUS | Status: AC
  Administered 2023-12-07 – 2023-12-08 (×2): 1 g via INTRAVENOUS
  Filled 2023-12-07 (×2): qty 50

## 2023-12-07 MED ORDER — DEXAMETHASONE SODIUM PHOSPHATE 10 MG/ML IJ SOLN
INTRAMUSCULAR | Status: DC | PRN
  Administered 2023-12-07: 10 mg via INTRAVENOUS

## 2023-12-07 MED ORDER — ALBUTEROL SULFATE (2.5 MG/3ML) 0.083% IN NEBU: 3.0000 mL | INHALATION_SOLUTION | RESPIRATORY_TRACT | Status: DC | PRN

## 2023-12-07 MED ORDER — OXYCODONE HCL 5 MG/5ML PO SOLN: 5.0000 mg | Freq: Once | ORAL | Status: DC | PRN

## 2023-12-07 MED ORDER — OXYCODONE HCL 5 MG PO TABS: 5.0000 mg | ORAL_TABLET | ORAL | Status: DC | PRN

## 2023-12-07 MED ORDER — HYDROMORPHONE HCL 1 MG/ML IJ SOLN
INTRAMUSCULAR | Status: DC | PRN
  Administered 2023-12-07 (×3): .25 mg via INTRAVENOUS
  Administered 2023-12-07: .5 mg via INTRAVENOUS

## 2023-12-07 MED ORDER — ACETAMINOPHEN 325 MG PO TABS: 650.0000 mg | ORAL_TABLET | ORAL | Status: DC | PRN

## 2023-12-07 MED ORDER — BUPIVACAINE HCL (PF) 0.25 % IJ SOLN
INTRAMUSCULAR | Status: AC
  Filled 2023-12-07: qty 30

## 2023-12-07 MED ORDER — NICOTINE 21 MG/24HR TD PT24
21.0000 mg | MEDICATED_PATCH | Freq: Every day | TRANSDERMAL | Status: DC
  Administered 2023-12-07: 21 mg via TRANSDERMAL
  Filled 2023-12-07 (×2): qty 1

## 2023-12-07 MED ORDER — VANCOMYCIN HCL 1000 MG IV SOLR
INTRAVENOUS | Status: DC | PRN
  Administered 2023-12-07: 1000 mg

## 2023-12-07 MED ORDER — VENLAFAXINE HCL ER 75 MG PO CP24
150.0000 mg | ORAL_CAPSULE | Freq: Every day | ORAL | Status: DC
  Administered 2023-12-07: 150 mg via ORAL
  Filled 2023-12-07: qty 2

## 2023-12-07 MED ORDER — THROMBIN 20000 UNITS EX SOLR
CUTANEOUS | Status: DC | PRN
  Administered 2023-12-07: 20 mL via TOPICAL

## 2023-12-07 MED ORDER — CEFAZOLIN SODIUM-DEXTROSE 2-4 GM/100ML-% IV SOLN
2.0000 g | INTRAVENOUS | Status: AC
  Administered 2023-12-07: 2 g via INTRAVENOUS

## 2023-12-07 MED ORDER — PROPOFOL 10 MG/ML IV BOLUS
INTRAVENOUS | Status: DC | PRN
  Administered 2023-12-07: 150 mg via INTRAVENOUS

## 2023-12-07 MED ORDER — POLYETHYLENE GLYCOL 3350 17 G PO PACK: 17.0000 g | PACK | Freq: Every day | ORAL | Status: DC | PRN

## 2023-12-07 MED ORDER — HYDROMORPHONE HCL 1 MG/ML IJ SOLN
INTRAMUSCULAR | Status: AC
  Filled 2023-12-07: qty 0.5

## 2023-12-07 MED ORDER — UMECLIDINIUM BROMIDE 62.5 MCG/ACT IN AEPB
1.0000 | INHALATION_SPRAY | Freq: Every day | RESPIRATORY_TRACT | Status: DC
  Administered 2023-12-08: 1 via RESPIRATORY_TRACT
  Filled 2023-12-07: qty 7

## 2023-12-07 MED ORDER — OXCARBAZEPINE 300 MG PO TABS
300.0000 mg | ORAL_TABLET | Freq: Every day | ORAL | Status: DC
Start: 2023-12-07 — End: 2023-12-08
  Administered 2023-12-07: 300 mg via ORAL
  Filled 2023-12-07: qty 1

## 2023-12-07 MED ORDER — THROMBIN 20000 UNITS EX SOLR
CUTANEOUS | Status: AC
  Filled 2023-12-07: qty 20000

## 2023-12-07 MED ORDER — SUGAMMADEX SODIUM 200 MG/2ML IV SOLN
INTRAVENOUS | Status: AC
  Filled 2023-12-07: qty 2

## 2023-12-07 MED ORDER — MENTHOL 3 MG MT LOZG: 1.0000 | LOZENGE | OROMUCOSAL | Status: DC | PRN

## 2023-12-07 MED ORDER — SODIUM CHLORIDE 0.9% FLUSH
3.0000 mL | Freq: Two times a day (BID) | INTRAVENOUS | Status: DC
  Administered 2023-12-07: 3 mL via INTRAVENOUS

## 2023-12-07 MED ORDER — MIDAZOLAM HCL 2 MG/2ML IJ SOLN
INTRAMUSCULAR | Status: AC
  Filled 2023-12-07: qty 2

## 2023-12-07 MED ORDER — KETAMINE HCL 50 MG/5ML IJ SOSY
PREFILLED_SYRINGE | INTRAMUSCULAR | Status: AC
  Filled 2023-12-07: qty 5

## 2023-12-07 MED ORDER — LIDOCAINE 2% (20 MG/ML) 5 ML SYRINGE
INTRAMUSCULAR | Status: DC | PRN
  Administered 2023-12-07: 40 mg via INTRAVENOUS
  Administered 2023-12-07: 60 mg via INTRAVENOUS

## 2023-12-07 MED ORDER — METHOCARBAMOL 1000 MG/10ML IJ SOLN: 500.0000 mg | Freq: Four times a day (QID) | INTRAMUSCULAR | Status: DC | PRN

## 2023-12-07 MED ORDER — KETOROLAC TROMETHAMINE 30 MG/ML IJ SOLN
30.0000 mg | Freq: Four times a day (QID) | INTRAMUSCULAR | Status: DC
  Administered 2023-12-07 – 2023-12-08 (×3): 30 mg via INTRAVENOUS
  Filled 2023-12-07 (×4): qty 1

## 2023-12-07 MED ORDER — PANTOPRAZOLE SODIUM 40 MG PO TBEC
40.0000 mg | DELAYED_RELEASE_TABLET | Freq: Every day | ORAL | Status: DC
  Filled 2023-12-07: qty 1

## 2023-12-07 MED ORDER — ACETAMINOPHEN 500 MG PO TABS
ORAL_TABLET | ORAL | Status: AC
  Administered 2023-12-07: 1000 mg via ORAL
  Filled 2023-12-07: qty 2

## 2023-12-07 MED ORDER — MIDAZOLAM HCL 2 MG/2ML IJ SOLN
INTRAMUSCULAR | Status: DC | PRN
  Administered 2023-12-07: 2 mg via INTRAVENOUS

## 2023-12-07 MED ORDER — ONDANSETRON HCL 4 MG PO TABS: 4.0000 mg | ORAL_TABLET | Freq: Four times a day (QID) | ORAL | Status: DC | PRN

## 2023-12-07 MED ORDER — ONDANSETRON HCL 4 MG/2ML IJ SOLN
INTRAMUSCULAR | Status: DC | PRN
  Administered 2023-12-07: 4 mg via INTRAVENOUS

## 2023-12-07 MED ORDER — AMPHETAMINE-DEXTROAMPHETAMINE 10 MG PO TABS
30.0000 mg | ORAL_TABLET | Freq: Two times a day (BID) | ORAL | Status: DC
  Administered 2023-12-07 – 2023-12-08 (×2): 30 mg via ORAL
  Filled 2023-12-07: qty 3

## 2023-12-07 MED ORDER — AMISULPRIDE (ANTIEMETIC) 5 MG/2ML IV SOLN: 10.0000 mg | Freq: Once | INTRAVENOUS | Status: DC | PRN

## 2023-12-07 MED ORDER — LACTATED RINGERS IV SOLN: INTRAVENOUS | Status: DC

## 2023-12-07 MED ORDER — BISACODYL 10 MG RE SUPP: 10.0000 mg | Freq: Every day | RECTAL | Status: DC | PRN

## 2023-12-07 MED ORDER — HYDROMORPHONE HCL 1 MG/ML IJ SOLN: 0.5000 mg | INTRAMUSCULAR | Status: DC | PRN

## 2023-12-07 MED ORDER — OXYCODONE HCL 5 MG PO TABS: 5.0000 mg | ORAL_TABLET | Freq: Once | ORAL | Status: DC | PRN

## 2023-12-07 SURGICAL SUPPLY — 49 items
BAG COUNTER SPONGE SURGICOUNT (BAG) ×2 IMPLANT
BENZOIN TINCTURE PRP APPL 2/3 (GAUZE/BANDAGES/DRESSINGS) ×2 IMPLANT
BLADE BONE MILL MEDIUM (MISCELLANEOUS) ×2 IMPLANT
BLADE CLIPPER SURG (BLADE) IMPLANT
BUR MATCHSTICK NEURO 3.0 LAGG (BURR) ×2 IMPLANT
CAGE EXP CATALYFT 9 (Plate) IMPLANT
CANISTER SUCT 3000ML PPV (MISCELLANEOUS) ×2 IMPLANT
CAP LOCK SPINE RADIUS (Cap) IMPLANT
CNTNR URN SCR LID CUP LEK RST (MISCELLANEOUS) ×2 IMPLANT
COVER BACK TABLE 60X90IN (DRAPES) ×2 IMPLANT
DERMABOND ADVANCED .7 DNX12 (GAUZE/BANDAGES/DRESSINGS) ×2 IMPLANT
DRAPE C-ARM 42X72 X-RAY (DRAPES) ×4 IMPLANT
DRAPE HALF SHEET 40X57 (DRAPES) IMPLANT
DRAPE LAPAROTOMY 100X72X124 (DRAPES) ×2 IMPLANT
DRAPE SURG 17X23 STRL (DRAPES) ×8 IMPLANT
DRSG OPSITE POSTOP 4X6 (GAUZE/BANDAGES/DRESSINGS) ×2 IMPLANT
DURAPREP 26ML APPLICATOR (WOUND CARE) ×2 IMPLANT
ELECT REM PT RETURN 9FT ADLT (ELECTROSURGICAL) ×1 IMPLANT
ELECTRODE REM PT RTRN 9FT ADLT (ELECTROSURGICAL) ×2 IMPLANT
EVACUATOR 1/8 PVC DRAIN (DRAIN) IMPLANT
GAUZE 4X4 16PLY ~~LOC~~+RFID DBL (SPONGE) IMPLANT
GAUZE SPONGE 4X4 12PLY STRL (GAUZE/BANDAGES/DRESSINGS) IMPLANT
GLOVE BIO SURGEON STRL SZ 6 (GLOVE) ×2 IMPLANT
GLOVE BIOGEL PI IND STRL 6.5 (GLOVE) ×2 IMPLANT
GLOVE ECLIPSE 9.0 STRL (GLOVE) ×4 IMPLANT
GLOVE EXAM NITRILE XL STR (GLOVE) IMPLANT
GOWN STRL REUS W/ TWL LRG LVL3 (GOWN DISPOSABLE) IMPLANT
GOWN STRL REUS W/ TWL XL LVL3 (GOWN DISPOSABLE) ×4 IMPLANT
GOWN STRL REUS W/TWL 2XL LVL3 (GOWN DISPOSABLE) IMPLANT
KIT BASIN OR (CUSTOM PROCEDURE TRAY) ×2 IMPLANT
KIT TURNOVER KIT B (KITS) ×2 IMPLANT
NDL HYPO 22X1.5 SAFETY MO (MISCELLANEOUS) ×2 IMPLANT
NEEDLE HYPO 22X1.5 SAFETY MO (MISCELLANEOUS) ×1 IMPLANT
NS IRRIG 1000ML POUR BTL (IV SOLUTION) ×2 IMPLANT
PACK LAMINECTOMY NEURO (CUSTOM PROCEDURE TRAY) ×2 IMPLANT
PUTTY GRAFTON DBF 6CC W/DELIVE (Putty) IMPLANT
ROD MAX 90MM (Rod) IMPLANT
SCREW POLYAXIAL 5.5X45MM (Screw) IMPLANT
SET SCREW VRST (Screw) IMPLANT
SPIKE FLUID TRANSFER (MISCELLANEOUS) ×2 IMPLANT
SPONGE SURGIFOAM ABS GEL 100 (HEMOSTASIS) ×2 IMPLANT
STRIP CLOSURE SKIN 1/2X4 (GAUZE/BANDAGES/DRESSINGS) ×4 IMPLANT
SUT VIC AB 0 CT1 18XCR BRD8 (SUTURE) ×4 IMPLANT
SUT VIC AB 2-0 CT1 18 (SUTURE) ×2 IMPLANT
SUT VIC AB 3-0 SH 8-18 (SUTURE) ×4 IMPLANT
TOWEL GREEN STERILE (TOWEL DISPOSABLE) ×2 IMPLANT
TOWEL GREEN STERILE FF (TOWEL DISPOSABLE) ×2 IMPLANT
TRAY FOLEY MTR SLVR 16FR STAT (SET/KITS/TRAYS/PACK) ×2 IMPLANT
WATER STERILE IRR 1000ML POUR (IV SOLUTION) ×2 IMPLANT

## 2023-12-07 NOTE — Transfer of Care (Signed)
 Immediate Anesthesia Transfer of Care Note  Patient: Nancy Bowers  Procedure(s) Performed: Posterior Lateral Interbody Fusion - Lumbar three-Lumbar four - Posterior Lateral and Interbody fusion (Back)  Patient Location: PACU  Anesthesia Type:General  Level of Consciousness: awake and sedated  Airway & Oxygen Therapy: Patient Spontanous Breathing and Patient connected to face mask oxygen  Post-op Assessment: Report given to RN and Post -op Vital signs reviewed and stable  Post vital signs: Reviewed and stable  Last Vitals:  Vitals Value Taken Time  BP 163/111 12/07/23 1300  Temp    Pulse 108 12/07/23 1307  Resp 14 12/07/23 1307  SpO2 92 % 12/07/23 1307  Vitals shown include unfiled device data.  Last Pain:  Vitals:   12/07/23 0815  TempSrc:   PainSc: 10-Worst pain ever      Patients Stated Pain Goal: 2 (12/07/23 0815)  Complications: No notable events documented.

## 2023-12-07 NOTE — Brief Op Note (Signed)
 12/07/2023  12:27 PM  PATIENT:  Nancy Bowers  48 y.o. female  PRE-OPERATIVE DIAGNOSIS:  Spondylolisthesis  POST-OPERATIVE DIAGNOSIS:  Spondylolisthesis  PROCEDURE:  Procedure(s): Posterior Lateral Interbody Fusion - Lumbar three-Lumbar four - Posterior Lateral and Interbody fusion (N/A)  SURGEON:  Surgeons and Role:    Julio Sicks, MD - Primary  PHYSICIAN ASSISTANT:   ASSISTANTSMarland Mcalpine   ANESTHESIA:   general  EBL:  200 mL   BLOOD ADMINISTERED:none  DRAINS: none   LOCAL MEDICATIONS USED:  MARCAINE     SPECIMEN:  No Specimen  DISPOSITION OF SPECIMEN:  N/A  COUNTS:  YES  TOURNIQUET:  * No tourniquets in log *  DICTATION: .Dragon Dictation  PLAN OF CARE: Admit for overnight observation  PATIENT DISPOSITION:  PACU - hemodynamically stable.   Delay start of Pharmacological VTE agent (>24hrs) due to surgical blood loss or risk of bleeding: yes

## 2023-12-07 NOTE — Progress Notes (Signed)
 Orthopedic Tech Progress Note Patient Details:  [name] [birth year] [id number]  Ortho Devices Type of Ortho Device: Lumbar corsett Ortho Device/Splint Interventions: Ordered, Adjustment   Post Interventions Patient Tolerated: Other (comment) Instructions Provided: Adjustment of device Adjusted to Size L. Left at bedside nurse advised. Tonye Pearson 12/07/2023, 4:08 PM

## 2023-12-07 NOTE — Op Note (Signed)
 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  Bilateral L3-4 decompressive laminotomies and foraminotomies, more than would be required for simple interbody fusion alone.  L3-4 posterior lumbar interbody fusion utilizing interbody cages, local harvested autograft, and morselized allograft  L3-S1 posterior lateral arthrodesis utilizing segmental pedicle screw fixation and local autografting.  Surgeon:Morayo Leven A.Nakyia Dau, M.D.  Asst. Surgeon: Doran Durand, NP  Anesthesia: General  Indication: 48 year old female remotely status post L4-5 and L5-S1 decompression and fusion with reasonably good results presents now with worsening back and lower extremity symptoms failing conservative management workup demonstrates evidence of severe adjacent segment degeneration with complete to space collapse and significant lateral listhesis at L3-4 with accompanying spinal stenosis.  Patient has failed conservative management.  She presents now for L3-4 decompression and fusion.  This will require reexploration of her prior L4-S1 fusion and revision of her instrumentation.  Operative note: After induction anesthesia, patient position prone on the Wilson frame appropriate padded.  Lumbar region prepped and draped sterilely.  Incision made from L3-S1.  Dissection performed bilaterally.  Retractor placed.  Fluoroscopy used.  Levels confirmed.  Previously placed pedicle screws potation at L4-L5 and S1 was dissected free, disassembled and the rods were removed.  The fusion was inspected at L4-5 and L5-S1 and found to be solid at both levels.  Instrumentation was solid at all locations.  Attention then placed to the L3-4 level.   Decompressive laminotomies and facetectomies were then performed using Leksell rongeurs, Kerrison arteries and high-speed drill to remove the inferior two thirds of the lamina of L3 the entire inferior facet and pars interarticularis of L3 were resected bilaterally.  The majority the superior articular process of L4 was resected bilaterally and the superior rim of the L4 lamina was resected.  Ligament flavum elevated and resected.  Foraminotomies complete on the course the exiting L3 and L4 nerve roots bilaterally.  Bilateral discectomy was then performed.  Disc space then prepared for interbody fusion.  With distractor placed patient's right side disc base cleaned of soft tissue on the left side.  A 9 mm Medtronic expandable titanium cage was then packed into place and expanded.  Distractor removed patient's right side.  The space prepared on the right side.  Morselized autograft packed in the interspace.  A second cage was then impacted in place and then expanded.  Pedicles of L3 were identified using surface landmarks and intraoperative fluoroscopy with superficial bone around the pedicle was then removed using high-speed drill.  Pedicle was then probed using a pedicle awl.  Each pedicle tract was probed and found to be solidly within the bone.  Each pedicle tract was then tapped with a screw tap.  Screw table was probed and found to be solidly within the bone.  5.5 mm Everest screws from [employer] placed bilaterally at L3.  Final images reveal good position of the cages and the hardware at the proper level with normal alignment of the spine.  Wound was then irrigated.  Each cage was then packed with demineralized bone fibers.  Gelfoam was placed over the laminotomy defect.  Short segment titanium rod was placed over the screw heads from L3-S1.  Locking caps placed over the screws.  Locking caps then engaged with the construct under mild compression.  Transverse  processes of L3 and L4 were decorticated.   Morselized autograft was packed posterolaterally for later fusion.  Vancomycin powder was left in the deep wound space.  Wounds then closed in layers with Vicryl sutures.  Steri-Strips and sterile dressing were applied.  No apparent complications.  Patient tolerated the procedure well and she returns to the recovery room postop.
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
## 2023-12-07 NOTE — Anesthesia Postprocedure Evaluation (Signed)
 Anesthesia Post Note  Patient: Nancy Bowers  Procedure(s) Performed: Posterior Lateral Interbody Fusion - Lumbar three-Lumbar four - Posterior Lateral and Interbody fusion (Back)     Patient location during evaluation: PACU Anesthesia Type: General Level of consciousness: awake and alert Pain management: pain level controlled Vital Signs Assessment: post-procedure vital signs reviewed and stable Respiratory status: spontaneous breathing, nonlabored ventilation, respiratory function stable and patient connected to nasal cannula oxygen Cardiovascular status: blood pressure returned to baseline and stable Postop Assessment: no apparent nausea or vomiting Anesthetic complications: no  No notable events documented.  Last Vitals:  Vitals:   12/07/23 1330 12/07/23 1345  BP: 106/73 99/66  Pulse: 91 94  Resp: 10 12  Temp:    SpO2: 95% 92%    Last Pain:  Vitals:   12/07/23 1400  TempSrc:   PainSc: 10-Worst pain ever                 Zoii Florer L Altan Kraai

## 2023-12-07 NOTE — Anesthesia Procedure Notes (Signed)
 Procedure Name: Intubation Date/Time: 12/07/2023 9:49 AM  Performed by: Ravina Milner C, CRNAPre-anesthesia Checklist: Patient identified, Emergency Drugs available, Suction available and Patient being monitored Patient Re-evaluated:Patient Re-evaluated prior to induction Oxygen Delivery Method: Circle System Utilized Preoxygenation: Pre-oxygenation with 100% oxygen Induction Type: IV induction Ventilation: Mask ventilation without difficulty Laryngoscope Size: Glidescope and 3 Grade View: Grade I Tube type: Oral Tube size: 7.0 mm Number of attempts: 1 Airway Equipment and Method: Rigid stylet and Video-laryngoscopy Placement Confirmation: ETT inserted through vocal cords under direct vision, positive ETCO2 and breath sounds checked- equal and bilateral Secured at: 23 cm Tube secured with: Tape Dental Injury: Teeth and Oropharynx as per pre-operative assessment

## 2023-12-08 DIAGNOSIS — M4316 Spondylolisthesis, lumbar region: Secondary | ICD-10-CM | POA: Diagnosis not present

## 2023-12-08 MED ORDER — KETOROLAC TROMETHAMINE 15 MG/ML IJ SOLN
INTRAMUSCULAR | Status: AC
  Filled 2023-12-08: qty 2

## 2023-12-08 MED ORDER — OXYCODONE HCL 5 MG PO TABS: 5.0000 mg | ORAL_TABLET | ORAL | 0 refills | Status: AC | PRN

## 2023-12-08 NOTE — Plan of Care (Signed)
  Problem: Education: Goal: Knowledge of General Education information will improve Description: Including pain rating scale, medication(s)/side effects and non-pharmacologic comfort measures Outcome: Adequate for Discharge   Problem: Health Behavior/Discharge Planning: Goal: Ability to manage health-related needs will improve Outcome: Adequate for Discharge   Problem: Clinical Measurements: Goal: Ability to maintain clinical measurements within normal limits will improve Outcome: Adequate for Discharge Goal: Will remain free from infection Outcome: Adequate for Discharge Goal: Diagnostic test results will improve Outcome: Adequate for Discharge Goal: Respiratory complications will improve Outcome: Adequate for Discharge Goal: Cardiovascular complication will be avoided Outcome: Adequate for Discharge   Problem: Activity: Goal: Risk for activity intolerance will decrease Outcome: Adequate for Discharge   Problem: Nutrition: Goal: Adequate nutrition will be maintained Outcome: Adequate for Discharge   Problem: Coping: Goal: Level of anxiety will decrease Outcome: Adequate for Discharge   Problem: Elimination: Goal: Will not experience complications related to bowel motility Outcome: Adequate for Discharge Goal: Will not experience complications related to urinary retention Outcome: Adequate for Discharge   Problem: Pain Managment: Goal: General experience of comfort will improve and/or be controlled Outcome: Adequate for Discharge   Problem: Safety: Goal: Ability to remain free from injury will improve Outcome: Adequate for Discharge   Problem: Skin Integrity: Goal: Risk for impaired skin integrity will decrease Outcome: Adequate for Discharge   Problem: Education: Goal: Ability to verbalize activity precautions or restrictions will improve Outcome: Adequate for Discharge Goal: Knowledge of the prescribed therapeutic regimen will improve Outcome: Adequate for  Discharge Goal: Understanding of discharge needs will improve Outcome: Adequate for Discharge   Problem: Activity: Goal: Ability to avoid complications of mobility impairment will improve Outcome: Adequate for Discharge Goal: Ability to tolerate increased activity will improve Outcome: Adequate for Discharge Goal: Will remain free from falls Outcome: Adequate for Discharge   Problem: Bowel/Gastric: Goal: Gastrointestinal status for postoperative course will improve Outcome: Adequate for Discharge   Problem: Clinical Measurements: Goal: Ability to maintain clinical measurements within normal limits will improve Outcome: Adequate for Discharge Goal: Postoperative complications will be avoided or minimized Outcome: Adequate for Discharge Goal: Diagnostic test results will improve Outcome: Adequate for Discharge   Problem: Pain Management: Goal: Pain level will decrease Outcome: Adequate for Discharge   Problem: Skin Integrity: Goal: Will show signs of wound healing Outcome: Adequate for Discharge   Problem: Health Behavior/Discharge Planning: Goal: Identification of resources available to assist in meeting health care needs will improve Outcome: Adequate for Discharge   Problem: Bladder/Genitourinary: Goal: Urinary functional status for postoperative course will improve Outcome: Adequate for Discharge   Problem: Acute Rehab PT Goals(only PT should resolve) Goal: Pt Will Go Supine/Side To Sit Outcome: Adequate for Discharge Goal: Patient Will Transfer Sit To/From Stand Outcome: Adequate for Discharge Goal: Pt Will Transfer Bed To Chair/Chair To Bed Outcome: Adequate for Discharge Goal: Pt Will Ambulate Outcome: Adequate for Discharge Goal: Pt/caregiver will Perform Home Exercise Program Outcome: Adequate for Discharge
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
## 2023-12-08 NOTE — Progress Notes (Signed)
 Explained discharge instructions to patient. Reviewed follow up appointment and next medication administration times. Also reviewed education. Patient verbalized having an understanding for instructions given. All belongings are in the patient's possession. Rollator will be delivered to the patient's home. Transported downstairs for discharge.
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
## 2023-12-08 NOTE — Discharge Summary (Signed)
 Physician Discharge Summary  Patient ID: Nancy Bowers MRN: [id number] DOB/AGE: 49-Apr-1977 49 y.o.  Admit date: 12/07/2023 Discharge date: 12/08/2023  Admission Diagnoses:adjacent segment disease with spondylolisthesis L3/4  Discharge Diagnoses:  Principal Problem:   Lumbar adjacent segment disease with spondylolisthesis   Discharged Condition: good  Hospital Course: Nancy Bowers was admitted and taken to the operating room for a PLIF at L3/4. Post op she is ambulating, voiding, and tolerating a regular diet. Her wound is clean, dry, and without signs of infection. She will have physical therapy at home.   Treatments: surgery:  Reexploration of L4-5 and S1 posterior lateral fusion with removal of hardware.   Bilateral L3-4 decompressive laminotomies and foraminotomies, more than would be required for simple interbody fusion alone.   L3-4 posterior lumbar interbody fusion utilizing interbody cages, local harvested autograft, and morselized allograft   L3-S1 posterior lateral arthrodesis utilizing segmental pedicle screw fixation and local autografting.  Discharge Exam: Blood pressure 112/69, pulse (!) 103, temperature 97.6 F (36.4 C), temperature source Oral, resp. rate 20, height 5\' 4"  (1.626 m), weight 81.6 kg, SpO2 98%. General appearance: alert, cooperative, and mild distress  Disposition: Discharge disposition: 01-Home or Self Care      Spondylolisthesis  Allergies as of 12/08/2023       Reactions   Aspirin Anaphylaxis   Bee Venom Hives   Fentanyl Rash        Medication List     TAKE these medications    albuterol 108 (90 Base) MCG/ACT inhaler Commonly known as: VENTOLIN HFA Inhale 1-2 puffs into the lungs every 4 (four) hours as needed for wheezing or shortness of breath.   allopurinol 100 MG tablet Commonly known as: ZYLOPRIM TAKE 1 TABLET (100 MG TOTAL) BY MOUTH DAILY.   amphetamine-dextroamphetamine 30 MG tablet Commonly known as: ADDERALL Take  30 mg by mouth 2 (two) times daily.   clonazePAM 1 MG tablet Commonly known as: KLONOPIN Take 1 mg by mouth 2 (two) times daily.   colchicine 0.6 MG tablet TAKE 1 TABLET (0.6 MG TOTAL) BY MOUTH DAILY.   desvenlafaxine 100 MG 24 hr tablet Commonly known as: PRISTIQ Take 1 tablet (100 mg total) by mouth daily. What changed: when to take this   famotidine 20 MG tablet Commonly known as: PEPCID Take 20 mg by mouth daily.   fluticasone 50 MCG/ACT nasal spray Commonly known as: FLONASE Place 1 spray into both nostrils daily as needed for allergies or rhinitis.   gabapentin 800 MG tablet Commonly known as: NEURONTIN Take 800 mg by mouth 2 (two) times daily.   meloxicam 15 MG tablet Commonly known as: MOBIC Take 15 mg by mouth daily.   methocarbamol 750 MG tablet Commonly known as: ROBAXIN Take 1,500 mg by mouth at bedtime.   nicotine 21 mg/24hr patch Commonly known as: NICODERM CQ - dosed in mg/24 hours Place 21 mg onto the skin daily.   omeprazole 40 MG capsule Commonly known as: PRILOSEC Take 40 mg by mouth daily.   OVER THE COUNTER MEDICATION Apply 1 Application topically as needed (nerve pain). Nervive Roll On Pain Relief   Oxcarbazepine 300 MG tablet Commonly known as: TRILEPTAL Take 300 mg by mouth at bedtime.   oxyCODONE 5 MG immediate release tablet Commonly known as: Oxy IR/ROXICODONE Take 1 tablet (5 mg total) by mouth every 4 (four) hours as needed for up to 7 days for moderate pain (pain score 4-6).   oxyCODONE-acetaminophen 10-325 MG tablet Commonly known as: PERCOCET Take  1 tablet by mouth every 4 (four) hours as needed for severe pain (pain score 7-10).   Trelegy Ellipta 200-62.5-25 MCG/ACT Aepb Generic drug: Fluticasone-Umeclidin-Vilant Inhale 1 puff into the lungs 2 (two) times daily.   Vitamin D (Ergocalciferol) 1.25 MG (50000 UNIT) Caps capsule Commonly known as: DRISDOL Take 50,000 Units by mouth once a week.               Durable  Medical Equipment  (From admission, onward)           Start     Ordered   12/07/23 1449  DME Walker rolling  Once       Question:  Patient needs a walker to treat with the following condition  Answer:  Lumbar adjacent segment disease with spondylolisthesis   12/07/23 1448   12/07/23 1449  DME 3 n 1  Once        12/07/23 1448            Follow-up Information     Julio Sicks, MD Follow up.   Specialty: Neurosurgery Why: keep your scheduled appointment Contact information: [address] [place] [address] [phone]                 Signed: Coletta Memos 12/08/2023, 9:55 AM

## 2023-12-08 NOTE — Discharge Instructions (Addendum)
 Spinal Fusion Care After Refer to this sheet in the next few weeks. These instructions provide you with information on caring for yourself after your procedure. Your caregiver may also give you more specific instructions. Your treatment has been planned according to current medical practices, but problems sometimes occur. Call your caregiver if you have any problems or questions after your procedure. HOME CARE INSTRUCTIONS  Take whatever pain medicine has been prescribed by your caregiver. Do not take over-the-counter pain medicine unless directed otherwise by your caregiver.  Do not drive if you are taking narcotic pain medicines.  Change your bandage (dressing) if necessary or as directed by your caregiver.  You may shower. The wound may get wet, simply pat the area dry.  If you have been prescribed medicine to prevent your blood from clotting, follow the directions carefully.  Check the area around your incision often. Look for redness and swelling. Also, look for anything leaking from your wound. You can use a mirror or have a family member inspect your incision if it is in a place where it is difficult for you to see.  Ask your caregiver what activities you should avoid and for how long.  Walk as much as possible.  Do not lift anything heavier than 5 lbs until your caregiver says it is safe.  Do not twist or bend for a few weeks. Try not to pull on things. Avoid sitting for long periods of time. Change positions at least every hour.

## 2023-12-08 NOTE — TOC Transition Note (Signed)
 Transition of Care [facility]) - Discharge Note   Patient Details  Name: Nancy Bowers MRN: [id number] Date of Birth: 09-19-1975  Transition of Care [facility]) CM/SW Contact:  Lawerance Sabal, RN Phone Number: 12/08/2023, 10:03 AM   Clinical Narrative:     Sherron Monday w patient to discuss DC needs.  Unit staff will provide [facility], I have ordered rollator to be delivered to the room. We discussed barriers with payor source for [facility] services and she is comfortable continuing exercises taught by PT at home w her spouse.  She will have provide ride home  Final next level of care: Home w Home Health Services Barriers to Discharge: No Barriers Identified   Patient Goals and CMS Choice Patient states their goals for this hospitalization and ongoing recovery are:: to go home   Choice offered to / list presented to : Patient      Discharge Placement                       Discharge Plan and Services Additional resources added to the After Visit Summary for                  DME Arranged: Walker rolling with seat DME Agency: [employer] Date DME Agency Contacted: 12/08/23 Time DME Agency Contacted: 1003 Representative spoke with at DME Agency: Vaughan Basta            Social Drivers of Health (SDOH) Interventions SDOH Screenings   Food Insecurity: No Food Insecurity (07/26/2017)  Transportation Needs: No Transportation Needs (07/26/2017)  Depression (PHQ2-9): Medium Risk (02/24/2021)  Financial Resource Strain: Medium Risk (07/26/2017)  Physical Activity: Inactive (07/26/2017)  Social Connections: Somewhat Isolated (07/26/2017)  Stress: Stress Concern Present (07/26/2017)  Tobacco Use: High Risk (12/07/2023)     Readmission Risk Interventions     No data to display

## 2023-12-11 MED FILL — Heparin Sodium (Porcine) Inj 1000 Unit/ML: INTRAMUSCULAR | Qty: 30 | Status: AC

## 2023-12-11 MED FILL — Sodium Chloride IV Soln 0.9%: INTRAVENOUS | Qty: 1000 | Status: AC

## 2024-02-27 ENCOUNTER — Other Ambulatory Visit: Payer: Self-pay | Admitting: Physician Assistant

## 2024-02-27 DIAGNOSIS — Z1231 Encounter for screening mammogram for malignant neoplasm of breast: Secondary | ICD-10-CM

## 2024-03-19 ENCOUNTER — Ambulatory Visit

## 2024-03-20 ENCOUNTER — Ambulatory Visit
Admission: RE | Admit: 2024-03-20 | Discharge: 2024-03-20 | Disposition: A | Discharge status: Home / Self Care | Source: Ambulatory Visit | Attending: Physician Assistant | Admitting: Physician Assistant

## 2024-03-20 DIAGNOSIS — Z1231 Encounter for screening mammogram for malignant neoplasm of breast: Secondary | ICD-10-CM

## 2024-03-27 ENCOUNTER — Other Ambulatory Visit: Payer: Self-pay | Admitting: Orthopedic Surgery

## 2024-03-27 ENCOUNTER — Other Ambulatory Visit: Payer: Self-pay | Admitting: Family

## 2024-04-29 ENCOUNTER — Ambulatory Visit: Attending: Neurosurgery | Admitting: Physical Therapy

## 2024-04-29 ENCOUNTER — Other Ambulatory Visit: Payer: Self-pay | Admitting: Family

## 2024-04-29 ENCOUNTER — Other Ambulatory Visit: Payer: Self-pay | Admitting: Orthopedic Surgery

## 2024-04-29 ENCOUNTER — Ambulatory Visit: Admitting: Physical Therapy

## 2024-04-29 ENCOUNTER — Other Ambulatory Visit: Payer: Self-pay

## 2024-04-29 ENCOUNTER — Encounter: Payer: Self-pay | Admitting: Physical Therapy

## 2024-04-29 DIAGNOSIS — M5459 Other low back pain: Secondary | ICD-10-CM | POA: Insufficient documentation

## 2024-04-29 DIAGNOSIS — M542 Cervicalgia: Secondary | ICD-10-CM | POA: Diagnosis present

## 2024-04-29 DIAGNOSIS — R2681 Unsteadiness on feet: Secondary | ICD-10-CM | POA: Diagnosis present

## 2024-04-29 DIAGNOSIS — M6281 Muscle weakness (generalized): Secondary | ICD-10-CM | POA: Diagnosis present
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
## 2024-05-08 ENCOUNTER — Ambulatory Visit: Admitting: Physical Therapy

## 2024-05-14 ENCOUNTER — Ambulatory Visit: Admitting: Physical Therapy

## 2024-05-14 ENCOUNTER — Encounter: Payer: Self-pay | Admitting: Physical Therapy

## 2024-05-14 DIAGNOSIS — M5459 Other low back pain: Secondary | ICD-10-CM
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
## 2024-05-16 ENCOUNTER — Ambulatory Visit: Admitting: Physical Therapy

## 2024-05-16 ENCOUNTER — Telehealth: Payer: Self-pay | Admitting: Physical Therapy

## 2024-05-20 ENCOUNTER — Ambulatory Visit: Admitting: Physical Therapy

## 2024-05-22 ENCOUNTER — Ambulatory Visit: Attending: Neurosurgery | Admitting: Physical Therapy

## 2024-05-22 ENCOUNTER — Encounter: Payer: Self-pay | Admitting: Physical Therapy

## 2024-05-22 DIAGNOSIS — R2681 Unsteadiness on feet: Secondary | ICD-10-CM | POA: Diagnosis present

## 2024-05-22 DIAGNOSIS — M6281 Muscle weakness (generalized): Secondary | ICD-10-CM | POA: Diagnosis present

## 2024-05-22 DIAGNOSIS — M542 Cervicalgia: Secondary | ICD-10-CM | POA: Diagnosis present

## 2024-05-22 DIAGNOSIS — M5459 Other low back pain: Secondary | ICD-10-CM | POA: Diagnosis present
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
## 2024-05-27 ENCOUNTER — Encounter: Admitting: Physical Therapy

## 2024-05-30 ENCOUNTER — Ambulatory Visit: Admitting: Physical Therapy

## 2024-05-30 ENCOUNTER — Encounter: Payer: Self-pay | Admitting: Physical Therapy

## 2024-05-30 DIAGNOSIS — M5459 Other low back pain: Secondary | ICD-10-CM

## 2024-05-30 DIAGNOSIS — M542 Cervicalgia: Secondary | ICD-10-CM

## 2024-05-30 DIAGNOSIS — R2681 Unsteadiness on feet: Secondary | ICD-10-CM
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
## 2024-06-02 ENCOUNTER — Encounter: Payer: Self-pay | Admitting: Physical Therapy

## 2024-06-02 ENCOUNTER — Ambulatory Visit: Admitting: Physical Therapy

## 2024-06-02 DIAGNOSIS — M5459 Other low back pain: Secondary | ICD-10-CM | POA: Diagnosis not present

## 2024-06-02 DIAGNOSIS — M6281 Muscle weakness (generalized): Secondary | ICD-10-CM

## 2024-06-02 DIAGNOSIS — R2681 Unsteadiness on feet: Secondary | ICD-10-CM

## 2024-06-02 DIAGNOSIS — M542 Cervicalgia: Secondary | ICD-10-CM
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
## 2024-06-05 ENCOUNTER — Ambulatory Visit: Admitting: Physical Therapy

## 2024-06-05 ENCOUNTER — Other Ambulatory Visit: Payer: Self-pay

## 2024-06-05 ENCOUNTER — Observation Stay (HOSPITAL_COMMUNITY)
Admission: EM | Admit: 2024-06-05 | Discharge: 2024-06-07 | Disposition: A | Discharge status: Home / Self Care | Attending: Family Medicine | Admitting: Family Medicine

## 2024-06-05 ENCOUNTER — Emergency Department (HOSPITAL_COMMUNITY)

## 2024-06-05 DIAGNOSIS — G8929 Other chronic pain: Secondary | ICD-10-CM

## 2024-06-05 DIAGNOSIS — M545 Low back pain, unspecified: Secondary | ICD-10-CM | POA: Diagnosis not present

## 2024-06-05 DIAGNOSIS — R0602 Shortness of breath: Secondary | ICD-10-CM | POA: Diagnosis present

## 2024-06-05 DIAGNOSIS — M1A9XX0 Chronic gout, unspecified, without tophus (tophi): Secondary | ICD-10-CM | POA: Diagnosis not present

## 2024-06-05 DIAGNOSIS — F17200 Nicotine dependence, unspecified, uncomplicated: Secondary | ICD-10-CM | POA: Diagnosis not present

## 2024-06-05 DIAGNOSIS — J9601 Acute respiratory failure with hypoxia: Principal | ICD-10-CM | POA: Diagnosis present

## 2024-06-05 DIAGNOSIS — F32A Depression, unspecified: Secondary | ICD-10-CM | POA: Diagnosis present

## 2024-06-05 DIAGNOSIS — E871 Hypo-osmolality and hyponatremia: Secondary | ICD-10-CM | POA: Diagnosis not present

## 2024-06-05 DIAGNOSIS — K219 Gastro-esophageal reflux disease without esophagitis: Secondary | ICD-10-CM | POA: Diagnosis not present

## 2024-06-05 DIAGNOSIS — Z79899 Other long term (current) drug therapy: Secondary | ICD-10-CM | POA: Diagnosis not present

## 2024-06-05 DIAGNOSIS — F419 Anxiety disorder, unspecified: Secondary | ICD-10-CM | POA: Diagnosis not present

## 2024-06-05 DIAGNOSIS — J441 Chronic obstructive pulmonary disease with (acute) exacerbation: Secondary | ICD-10-CM

## 2024-06-05 LAB — BASIC METABOLIC PANEL WITH GFR
Anion gap: 10 (ref 5–15)
BUN: 14 mg/dL (ref 6–20)
CO2: 30 mmol/L (ref 22–32)
Calcium: 8.9 mg/dL (ref 8.9–10.3)
Chloride: 85 mmol/L — ABNORMAL LOW (ref 98–111)
Creatinine, Ser: 0.89 mg/dL (ref 0.44–1.00)
GFR, Estimated: >60 mL/min (ref >60)
Glucose, Bld: 99 mg/dL (ref 70–99)
Potassium: 4.3 mmol/L (ref 3.5–5.1)
Sodium: 125 mmol/L — ABNORMAL LOW (ref 135–145)

## 2024-06-05 LAB — CBC WITH DIFFERENTIAL/PLATELET
Abs Immature Granulocytes: 0.02 K/uL (ref 0.00–0.07)
Basophils Absolute: 0.1 K/uL (ref 0.0–0.1)
Basophils Relative: 1 %
Eosinophils Absolute: 0.3 K/uL (ref 0.0–0.5)
Eosinophils Relative: 5 %
HCT: 39.3 % (ref 36.0–46.0)
Hemoglobin: 12.8 g/dL (ref 12.0–15.0)
Immature Granulocytes: 0 %
Lymphocytes Relative: 37 %
Lymphs Abs: 2.5 K/uL (ref 0.7–4.0)
MCH: 29.4 pg (ref 26.0–34.0)
MCHC: 32.6 g/dL (ref 30.0–36.0)
MCV: 90.1 fL (ref 80.0–100.0)
Monocytes Absolute: 0.5 K/uL (ref 0.1–1.0)
Monocytes Relative: 7 %
Neutro Abs: 3.4 K/uL (ref 1.7–7.7)
Neutrophils Relative %: 50 %
Platelets: 358 K/uL (ref 150–400)
RBC: 4.36 MIL/uL (ref 3.87–5.11)
RDW: 13.7 % (ref 11.5–15.5)
WBC: 6.8 K/uL (ref 4.0–10.5)
nRBC: 0 % (ref 0.0–0.2)

## 2024-06-05 LAB — TROPONIN I (HIGH SENSITIVITY): Troponin I (High Sensitivity): 5 ng/L (ref <18)

## 2024-06-05 LAB — RESP PANEL BY RT-PCR (RSV, FLU A&B, COVID)  RVPGX2
Influenza A by PCR: NEGATIVE
Influenza B by PCR: NEGATIVE
Resp Syncytial Virus by PCR: NEGATIVE
SARS Coronavirus 2 by RT PCR: NEGATIVE

## 2024-06-05 MED ORDER — OXYCODONE-ACETAMINOPHEN 5-325 MG PO TABS
2.0000 | ORAL_TABLET | Freq: Once | ORAL | Status: AC
  Administered 2024-06-05: 2 via ORAL
  Filled 2024-06-05: qty 2

## 2024-06-05 MED ORDER — METHYLPREDNISOLONE SODIUM SUCC 125 MG IJ SOLR
125.0000 mg | Freq: Once | INTRAMUSCULAR | Status: AC
  Administered 2024-06-05: 125 mg via INTRAVENOUS
  Filled 2024-06-05: qty 2

## 2024-06-05 MED ORDER — DIPHENHYDRAMINE HCL 50 MG/ML IJ SOLN
25.0000 mg | Freq: Once | INTRAMUSCULAR | Status: AC
  Administered 2024-06-05: 25 mg via INTRAVENOUS
  Filled 2024-06-05: qty 1

## 2024-06-05 MED ORDER — IPRATROPIUM-ALBUTEROL 0.5-2.5 (3) MG/3ML IN SOLN
3.0000 mL | Freq: Once | RESPIRATORY_TRACT | Status: AC
  Administered 2024-06-05: 3 mL via RESPIRATORY_TRACT
  Filled 2024-06-05: qty 3

## 2024-06-05 MED ORDER — IPRATROPIUM-ALBUTEROL 0.5-2.5 (3) MG/3ML IN SOLN
3.0000 mL | Freq: Once | RESPIRATORY_TRACT | Status: AC
  Administered 2024-06-05: 3 mL via RESPIRATORY_TRACT
  Filled 2024-06-05: qty 6

## 2024-06-05 MED ORDER — SODIUM CHLORIDE 0.9 % IV SOLN: Freq: Once | INTRAVENOUS | Status: AC

## 2024-06-05 MED ORDER — SODIUM CHLORIDE 0.9 % IV BOLUS
1000.0000 mL | Freq: Once | INTRAVENOUS | Status: AC
  Administered 2024-06-05: 1000 mL via INTRAVENOUS

## 2024-06-05 NOTE — ED Provider Triage Note (Signed)
 Emergency Medicine Provider Triage Evaluation Note  Nancy Bowers , a 48 y.o. female  was evaluated in triage.  Pt complains of shortness of breath secondary to allergic reaction from the flu shot 9 days ago.  Review of Systems  Positive: Shortness of breath,  Negative: Diarrhea constipation nausea vomiting rashes dizziness syncope  Physical Exam  BP (!) 140/90   Pulse 72   Temp 98.2 F (36.8 C) (Oral)   Resp (!) 22   LMP  (LMP Unknown) Comment: Patient states last menstrual cycle was sometime in 2024 but she is not sure. Per the patient, her PCP told her she was in menopause. Patient also had a tubal ligations. Urine preg test negative on 12/07/23  SpO2 95%  Gen:   Awake, no distress   Resp:  Normal effort with mild wheezes in the upper lobes.  Patient has history of COPD and active smoker.  Patient is in no acute respiratory distress and saturation at 95% on room air. MSK:   Moves extremities without difficulty  Other:  No swelling noted throughout airway, no active respiratory distress, no rashes noted  Medical Decision Making  Medically screening exam initiated at 1:16 PM.  Appropriate orders placed.  [name] was informed that the remainder of the evaluation will be completed by another provider, this initial triage assessment does not replace that evaluation, and the importance of remaining in the ED until their evaluation is complete.  48 year old female presents to ED with complaints of allergic reaction secondary to flu shot 9 days ago.  Patient has had the flu shot before with no reaction.  Patient reports that her breathing has gotten worse over the past 9 days.  Patient has significant history of COPD with active smoking.  Patient reports smoking today.  Patient advised she took a DuoNeb last night with relief.  Patient currently is satting at 96% on room air at time of evaluation with mild wheeze in upper lobes.  Patient has no active respiratory distress.  No swelling in  airway noted.  No obvious rashes or swelling noted on initial exam.   Myriam Fonda RAMAN, PA-C 06/05/24 1326

## 2024-06-05 NOTE — ED Notes (Signed)
 EDP at bedside

## 2024-06-05 NOTE — ED Provider Notes (Addendum)
 [facility] EMERGENCY DEPARTMENT AT [facility] Provider Note   CSN: [id number] Arrival date & time: 06/05/24  1126     Patient presents with: Allergic Reaction   Nancy Bowers is a 48 y.o. female.   Patient is a 48 year old female with a past medical history of asthma and COPD who presents emergency department the chief complaint of shortness of breath, sore throat, intermittent body aches which have been ongoing since receiving her flu shot approximately 9 days ago.  Patient notes that her PCP sent her to the emergency department for further evaluation secondary to possible low O2 saturations.  Patient does note that she has been doing her breathing treatments at home with only minimal improvement in her symptoms.  Patient notes that she did initially have some redness and swelling to the left upper extremity where the flu shot was administered but notes that this has improved but she still has some mild discoloration at the site.  Patient notes that she has had no associated abdominal pain, nausea, vomiting, diarrhea.  She does note that she has had some intermittent chest discomfort.  She notes that there has been no swelling to her lower extremities.  She does admit to an associated cough which has been nonproductive.   Allergic Reaction Presenting symptoms: wheezing        Prior to Admission medications   Medication Sig Start Date End Date Taking? Authorizing Provider  albuterol  (PROVENTIL  HFA;VENTOLIN  HFA) 108 (90 Base) MCG/ACT inhaler Inhale 1-2 puffs into the lungs every 4 (four) hours as needed for wheezing or shortness of breath.    [provider]  allopurinol  (ZYLOPRIM ) 100 MG tablet TAKE 1 TABLET (100 MG TOTAL) BY MOUTH DAILY. 04/29/24   Harden Jerona GAILS, MD  amphetamine -dextroamphetamine  (ADDERALL) 30 MG tablet Take 30 mg by mouth 2 (two) times daily. 01/25/22   [provider]  clonazePAM  (KLONOPIN ) 1 MG tablet Take 1 mg by mouth 2 (two) times  daily. 11/20/23   [provider]  colchicine  0.6 MG tablet TAKE 1 TABLET (0.6 MG TOTAL) BY MOUTH DAILY. 04/29/24   Harden Jerona GAILS, MD  desvenlafaxine  (PRISTIQ ) 100 MG 24 hr tablet Take 1 tablet (100 mg total) by mouth daily. Patient taking differently: Take 100 mg by mouth at bedtime. 02/24/21   Brutus Dales, MD  famotidine  (PEPCID ) 20 MG tablet Take 20 mg by mouth daily. 10/17/23   [provider]  fluticasone  (FLONASE ) 50 MCG/ACT nasal spray Place 1 spray into both nostrils daily as needed for allergies or rhinitis.    [provider]  Fluticasone -Umeclidin-Vilant (TRELEGY ELLIPTA) 200-62.5-25 MCG/ACT AEPB Inhale 1 puff into the lungs 2 (two) times daily.    [provider]  gabapentin  (NEURONTIN ) 800 MG tablet Take 800 mg by mouth 2 (two) times daily.    [provider]  meloxicam  (MOBIC ) 15 MG tablet Take 15 mg by mouth daily. 01/12/22   [provider]  methocarbamol  (ROBAXIN ) 750 MG tablet Take 1,500 mg by mouth at bedtime. 01/12/22   [provider]  nicotine  (NICODERM CQ  - DOSED IN MG/24 HOURS) 21 mg/24hr patch Place 21 mg onto the skin daily.    [provider]  omeprazole (PRILOSEC) 40 MG capsule Take 40 mg by mouth daily. 09/17/23   [provider]  OVER THE COUNTER MEDICATION Apply 1 Application topically as needed (nerve pain). Nervive Roll On Pain Relief    [provider]  Oxcarbazepine  (TRILEPTAL ) 300 MG tablet Take 300 mg  by mouth at bedtime. 11/20/23   [provider]  oxyCODONE -acetaminophen  (PERCOCET) 10-325 MG tablet Take 1 tablet by mouth every 4 (four) hours as needed for severe pain (pain score 7-10).    [provider]  Vitamin D , Ergocalciferol , (DRISDOL ) 1.25 MG (50000 UNIT) CAPS capsule Take 50,000 Units by mouth once a week.    [provider]    Allergies: Aspirin, Bee venom, and Fentanyl     Review of Systems  Constitutional:  Positive for fatigue.   Respiratory:  Positive for cough, shortness of breath and wheezing.   All other systems reviewed and are negative.   Updated Vital Signs BP (!) 170/98 (BP Location: Right Arm)   Pulse 60   Temp 98 F (36.7 C) (Oral)   Resp 18   Ht 5' 4 (1.626 m)   Wt 86.2 kg   LMP  (LMP Unknown) Comment: Patient states last menstrual cycle was sometime in 2024 but she is not sure. Per the patient, her PCP told her she was in menopause. Patient also had a tubal ligations. Urine preg test negative on 12/07/23  SpO2 90%   BMI 32.61 kg/m   Physical Exam Vitals and nursing note reviewed.  Constitutional:      General: She is not in acute distress.    Appearance: Normal appearance. She is not ill-appearing.  HENT:     Head: Normocephalic and atraumatic.     Nose: Nose normal.     Mouth/Throat:     Mouth: Mucous membranes are moist.  Eyes:     Extraocular Movements: Extraocular movements intact.     Conjunctiva/sclera: Conjunctivae normal.     Pupils: Pupils are equal, round, and reactive to light.  Cardiovascular:     Rate and Rhythm: Normal rate and regular rhythm.     Pulses: Normal pulses.     Heart sounds: Normal heart sounds. No murmur heard.    No gallop.  Pulmonary:     Effort: Pulmonary effort is normal. No respiratory distress.     Breath sounds: No stridor. Wheezing present. No rhonchi or rales.  Abdominal:     General: Abdomen is flat. Bowel sounds are normal. There is no distension.     Palpations: Abdomen is soft.     Tenderness: There is no abdominal tenderness. There is no guarding.  Musculoskeletal:        General: Normal range of motion.     Cervical back: Normal range of motion and neck supple. No rigidity or tenderness.     Right lower leg: No edema.     Left lower leg: No edema.  Skin:    General: Skin is warm and dry.     Findings: No bruising or rash.  Neurological:     General: No focal deficit present.     Mental Status: She is alert and oriented to person,  place, and time. Mental status is at baseline.  Psychiatric:        Mood and Affect: Mood normal.        Behavior: Behavior normal.        Thought Content: Thought content normal.        Judgment: Judgment normal.     (all labs ordered are listed, but only abnormal results are displayed) Labs Reviewed  BASIC METABOLIC PANEL WITH GFR - Abnormal; Notable for the following components:      Result Value   Sodium 125 (*)    Chloride 85 (*)    All other  components within normal limits  RESP PANEL BY RT-PCR (RSV, FLU A&B, COVID)  [id number]  CBC WITH DIFFERENTIAL/PLATELET  TROPONIN I (HIGH SENSITIVITY)    EKG: None  Radiology: DG Chest 1 View Result Date: 06/05/2024 CLINICAL DATA:  Shortness of breath. EXAM: CHEST  1 VIEW COMPARISON:  October 08, 2014. FINDINGS: The heart size and mediastinal contours are within normal limits. Both lungs are clear. The visualized skeletal structures are unremarkable. IMPRESSION: No active disease. Electronically Signed   By: Lynwood Landy Raddle M.D.   On: 06/05/2024 14:08     Procedures   Medications Ordered in the ED  sodium chloride  0.9 % bolus 1,000 mL (1,000 mLs Intravenous New Bag/Given 06/05/24 2119)  ipratropium-albuterol  (DUONEB) 0.5-2.5 (3) MG/3ML nebulizer solution 3 mL (3 mLs Nebulization Given 06/05/24 2120)  ipratropium-albuterol  (DUONEB) 0.5-2.5 (3) MG/3ML nebulizer solution 3 mL (3 mLs Nebulization Given 06/05/24 2119)  diphenhydrAMINE  (BENADRYL ) injection 25 mg (25 mg Intravenous Given 06/05/24 2118)  methylPREDNISolone  sodium succinate (SOLU-MEDROL ) 125 mg/2 mL injection 125 mg (125 mg Intravenous Given 06/05/24 2118)  oxyCODONE -acetaminophen  (PERCOCET/ROXICET) 5-325 MG per tablet 2 tablet (2 tablets Oral Given 06/05/24 2119)                                    Medical Decision Making Risk Prescription drug management. Decision regarding hospitalization.   This patient presents to the ED for concern of shortness of breath, generalized  malaise fatigue, this involves an extensive number of treatment options, and is a complaint that carries with it a high risk of complications and morbidity.  The differential diagnosis includes COPD, asthma, CHF, ACS, pulmonary embolus, pericarditis, myocarditis, allergic reaction, pneumonia, pneumothorax, hemothorax   Co morbidities that complicate the patient evaluation  Asthma and COPD   Additional history obtained:  Additional history obtained from medical records External records from outside source obtained and reviewed including medical records   Lab Tests:  I Ordered, and personally interpreted labs.  The pertinent results include: No leukocytosis, no anemia, hyponatremia noted, hypochloremia, normal creatinine, negative troponin, negative viral swab   Imaging Studies ordered:  I ordered imaging studies including chest x-ray I independently visualized and interpreted imaging which showed no acute cardiopulmonary process I agree with the radiologist interpretation   Cardiac Monitoring: / EKG:  The patient was maintained on a cardiac monitor.  I personally viewed and interpreted the cardiac monitored which showed an underlying rhythm of: Normal sinus rhythm, no ST/T wave changes, no ischemic changes, no STEMI   Consultations Obtained:  I requested consultation with the hospitalist,  and discussed lab and imaging findings as well as pertinent plan - they recommend: Admission   Problem List / ED Course / Critical interventions / Medication management  Patient does remain stable at this time.  Wheezing has improved with treatment in the emergency department.  Repeat evaluation did demonstrate that she is now hypoxic with O2 saturation approximately 88% on room air while at rest.  She was placed on 2 L nasal cannula with improvement in her saturation.  Low suspicion for line etiology such as ACS at this point.  EKG has no acute ischemic changes and she has negative troponin.   Low suspicion for pulmonary embolus in this patient.  Suspect that symptoms are secondary to COPD exacerbation.  Will plan for admission to hospital service for further management.  Have discussed patient case with the hospitalist Dr. Lou who has excepted  for admission. I ordered medication including IV fluids, DuoNeb, Solu-Medrol  for COPD exacerbation Reevaluation of the patient after these medicines showed that the patient improved I have reviewed the patients home medicines and have made adjustments as needed   Social Determinants of Health:  None   Test / Admission - Considered:  Admission     Final diagnoses:  None    ED Discharge Orders     None          Daralene Lonni BIRCH, PA-C 06/05/24 2312    Daralene Lonni BIRCH, PA-C 06/05/24 2317    Darra Fonda MATSU, MD 06/05/24 [phone]
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
## 2024-06-05 NOTE — ED Notes (Addendum)
 A

## 2024-06-05 NOTE — ED Triage Notes (Signed)
 Pt. Received a flu shot on the 9th and has been  having wheezing, sob difficulty swallowing. Pt. Has a local reaction, appears to be better. Has been taking benadryl   Pt.s PCP sent her to ED for evaluation due to low O2 levels.  Her O2 level at present is 95%.   Pt. Is having audible wheezing   Pt is able to speak full sentences.

## 2024-06-05 NOTE — ED Notes (Signed)
 RN assessed pt after she complained of shortness of breath pt speaking in full sentences. Pt articulated that she takes percocet at home and has not taken it since 5am .

## 2024-06-05 NOTE — ED Notes (Signed)
Pt placed on 2LNC o2.

## 2024-06-06 DIAGNOSIS — E871 Hypo-osmolality and hyponatremia: Secondary | ICD-10-CM

## 2024-06-06 DIAGNOSIS — K219 Gastro-esophageal reflux disease without esophagitis: Secondary | ICD-10-CM

## 2024-06-06 DIAGNOSIS — J441 Chronic obstructive pulmonary disease with (acute) exacerbation: Secondary | ICD-10-CM

## 2024-06-06 DIAGNOSIS — M545 Low back pain, unspecified: Secondary | ICD-10-CM

## 2024-06-06 DIAGNOSIS — M1A9XX0 Chronic gout, unspecified, without tophus (tophi): Secondary | ICD-10-CM

## 2024-06-06 DIAGNOSIS — J9601 Acute respiratory failure with hypoxia: Secondary | ICD-10-CM | POA: Diagnosis not present

## 2024-06-06 LAB — BASIC METABOLIC PANEL WITH GFR
Anion gap: 8 (ref 5–15)
BUN: 10 mg/dL (ref 6–20)
CO2: 28 mmol/L (ref 22–32)
Calcium: 9.2 mg/dL (ref 8.9–10.3)
Chloride: 95 mmol/L — ABNORMAL LOW (ref 98–111)
Creatinine, Ser: 0.88 mg/dL (ref 0.44–1.00)
GFR, Estimated: >60 mL/min (ref >60)
Glucose, Bld: 154 mg/dL — ABNORMAL HIGH (ref 70–99)
Potassium: 4.2 mmol/L (ref 3.5–5.1)
Sodium: 131 mmol/L — ABNORMAL LOW (ref 135–145)

## 2024-06-06 LAB — RESPIRATORY PANEL BY PCR

## 2024-06-06 LAB — CBC
HCT: 40.9 % (ref 36.0–46.0)
Hemoglobin: 13.4 g/dL (ref 12.0–15.0)
MCH: 29 pg (ref 26.0–34.0)
MCHC: 32.8 g/dL (ref 30.0–36.0)
MCV: 88.5 fL (ref 80.0–100.0)
Platelets: 359 K/uL (ref 150–400)
RBC: 4.62 MIL/uL (ref 3.87–5.11)
RDW: 13.8 % (ref 11.5–15.5)
WBC: 4.2 K/uL (ref 4.0–10.5)
nRBC: 0 % (ref 0.0–0.2)

## 2024-06-06 LAB — SODIUM, URINE, RANDOM: Sodium, Ur: 33 mmol/L

## 2024-06-06 LAB — HIV ANTIBODY (ROUTINE TESTING W REFLEX): HIV Screen 4th Generation wRfx: NONREACTIVE

## 2024-06-06 LAB — OSMOLALITY, URINE: Osmolality, Ur: 119 mosm/kg — ABNORMAL LOW (ref 300–900)

## 2024-06-06 MED ORDER — NICOTINE 14 MG/24HR TD PT24
14.0000 mg | MEDICATED_PATCH | Freq: Every day | TRANSDERMAL | Status: DC
  Administered 2024-06-06 – 2024-06-07 (×3): 14 mg via TRANSDERMAL
  Filled 2024-06-06 (×3): qty 1

## 2024-06-06 MED ORDER — ENOXAPARIN SODIUM 40 MG/0.4ML IJ SOSY
40.0000 mg | PREFILLED_SYRINGE | Freq: Every day | INTRAMUSCULAR | Status: DC
  Administered 2024-06-06 – 2024-06-07 (×2): 40 mg via SUBCUTANEOUS
  Filled 2024-06-06 (×2): qty 0.4

## 2024-06-06 MED ORDER — ONDANSETRON HCL 4 MG/2ML IJ SOLN: 4.0000 mg | Freq: Four times a day (QID) | INTRAMUSCULAR | Status: DC | PRN

## 2024-06-06 MED ORDER — ACETAMINOPHEN 650 MG RE SUPP: 650.0000 mg | Freq: Four times a day (QID) | RECTAL | Status: DC | PRN

## 2024-06-06 MED ORDER — ONDANSETRON HCL 4 MG PO TABS: 4.0000 mg | ORAL_TABLET | Freq: Four times a day (QID) | ORAL | Status: DC | PRN

## 2024-06-06 MED ORDER — IPRATROPIUM-ALBUTEROL 0.5-2.5 (3) MG/3ML IN SOLN
3.0000 mL | Freq: Four times a day (QID) | RESPIRATORY_TRACT | Status: DC | PRN
  Administered 2024-06-06: 3 mL via RESPIRATORY_TRACT
  Filled 2024-06-06: qty 3

## 2024-06-06 MED ORDER — BISACODYL 5 MG PO TBEC: 5.0000 mg | DELAYED_RELEASE_TABLET | Freq: Every day | ORAL | Status: DC | PRN

## 2024-06-06 MED ORDER — COLCHICINE 0.6 MG PO TABS
0.6000 mg | ORAL_TABLET | Freq: Every day | ORAL | Status: DC
  Administered 2024-06-06 – 2024-06-07 (×2): 0.6 mg via ORAL
  Filled 2024-06-06 (×2): qty 1

## 2024-06-06 MED ORDER — SENNOSIDES-DOCUSATE SODIUM 8.6-50 MG PO TABS: 1.0000 | ORAL_TABLET | Freq: Every evening | ORAL | Status: DC | PRN

## 2024-06-06 MED ORDER — METHOCARBAMOL 500 MG PO TABS
1500.0000 mg | ORAL_TABLET | Freq: Every day | ORAL | Status: DC
  Administered 2024-06-06 (×2): 1500 mg via ORAL
  Filled 2024-06-06 (×2): qty 3

## 2024-06-06 MED ORDER — FLUTICASONE PROPIONATE 50 MCG/ACT NA SUSP: 1.0000 | Freq: Every day | NASAL | Status: DC | PRN

## 2024-06-06 MED ORDER — BUDESON-GLYCOPYRROL-FORMOTEROL 160-9-4.8 MCG/ACT IN AERO
2.0000 | INHALATION_SPRAY | Freq: Two times a day (BID) | RESPIRATORY_TRACT | Status: DC
Start: 2024-06-06 — End: 2024-06-07
  Administered 2024-06-06 – 2024-06-07 (×3): 2 via RESPIRATORY_TRACT
  Filled 2024-06-06: qty 5.9

## 2024-06-06 MED ORDER — CLONAZEPAM 0.5 MG PO TABS
1.0000 mg | ORAL_TABLET | Freq: Two times a day (BID) | ORAL | Status: DC
  Administered 2024-06-06 – 2024-06-07 (×4): 1 mg via ORAL
  Filled 2024-06-06 (×4): qty 2

## 2024-06-06 MED ORDER — OXCARBAZEPINE 300 MG PO TABS
300.0000 mg | ORAL_TABLET | Freq: Every day | ORAL | Status: DC
  Administered 2024-06-06 (×2): 300 mg via ORAL
  Filled 2024-06-06 (×3): qty 1

## 2024-06-06 MED ORDER — ALLOPURINOL 100 MG PO TABS
100.0000 mg | ORAL_TABLET | Freq: Every day | ORAL | Status: DC
  Administered 2024-06-06 – 2024-06-07 (×2): 100 mg via ORAL
  Filled 2024-06-06 (×2): qty 1

## 2024-06-06 MED ORDER — OXYCODONE HCL 5 MG PO TABS
5.0000 mg | ORAL_TABLET | Freq: Four times a day (QID) | ORAL | Status: DC | PRN
Start: 2024-06-06 — End: 2024-06-07
  Administered 2024-06-06 – 2024-06-07 (×5): 5 mg via ORAL
  Filled 2024-06-06 (×5): qty 1

## 2024-06-06 MED ORDER — VENLAFAXINE HCL ER 75 MG PO CP24
150.0000 mg | ORAL_CAPSULE | Freq: Every day | ORAL | Status: DC
  Administered 2024-06-06 – 2024-06-07 (×2): 150 mg via ORAL
  Filled 2024-06-06 (×2): qty 2

## 2024-06-06 MED ORDER — PANTOPRAZOLE SODIUM 40 MG PO TBEC
40.0000 mg | DELAYED_RELEASE_TABLET | Freq: Every day | ORAL | Status: DC
Start: 2024-06-06 — End: 2024-06-07
  Administered 2024-06-06 – 2024-06-07 (×2): 40 mg via ORAL
  Filled 2024-06-06 (×2): qty 1

## 2024-06-06 MED ORDER — AZITHROMYCIN 250 MG PO TABS
500.0000 mg | ORAL_TABLET | Freq: Every day | ORAL | Status: DC
  Administered 2024-06-06 – 2024-06-07 (×2): 500 mg via ORAL
  Filled 2024-06-06 (×2): qty 2

## 2024-06-06 MED ORDER — IPRATROPIUM-ALBUTEROL 0.5-2.5 (3) MG/3ML IN SOLN
3.0000 mL | RESPIRATORY_TRACT | Status: AC
  Administered 2024-06-06: 3 mL via RESPIRATORY_TRACT
  Filled 2024-06-06: qty 3

## 2024-06-06 MED ORDER — OXYCODONE-ACETAMINOPHEN 5-325 MG PO TABS
1.0000 | ORAL_TABLET | Freq: Four times a day (QID) | ORAL | Status: DC | PRN
  Administered 2024-06-06 – 2024-06-07 (×7): 1 via ORAL
  Filled 2024-06-06 (×7): qty 1

## 2024-06-06 MED ORDER — ACETAMINOPHEN 325 MG PO TABS
650.0000 mg | ORAL_TABLET | Freq: Four times a day (QID) | ORAL | Status: DC | PRN
  Administered 2024-06-06: 650 mg via ORAL
  Filled 2024-06-06: qty 2

## 2024-06-06 MED ORDER — OXYCODONE-ACETAMINOPHEN 10-325 MG PO TABS
1.0000 | ORAL_TABLET | ORAL | Status: DC | PRN
Start: 2024-06-06 — End: 2024-06-06

## 2024-06-06 MED ORDER — PREDNISONE 20 MG PO TABS
40.0000 mg | ORAL_TABLET | Freq: Every day | ORAL | Status: DC
  Administered 2024-06-06 – 2024-06-07 (×2): 40 mg via ORAL
  Filled 2024-06-06 (×2): qty 2

## 2024-06-06 MED ORDER — SODIUM CHLORIDE 0.9 % IV SOLN: INTRAVENOUS | Status: AC

## 2024-06-06 NOTE — Progress Notes (Signed)
 PROGRESS NOTE  Nancy Bowers  FMW:[id number] DOB: 30-May-1976 DOA: 06/05/2024 [facility]CP: [facility]  Brief Narrative: 48 y.o. female with medical history significant for COPD, bipolar disorder, ADHD, anxiety and depression, OCD, arthritis, gout and chronic back pain s/p lumbar fusion who was sent by PCP for evaluation of hypoxia and shortness of breath.  Patient reports she had a flu shot on September 9 and since then, she has had persistent wheezing and shortness of breath.  She reports having some erythema around the injection site that has improved after taking Benadryl .  She has been attempting her breathing treatment at night with some relief before bed however her symptoms returns during the day.  Due to her persistent wheezing and SOB, she followed up with her PCP today. Patient became hypoxic during evaluation and she was advised to present to the ED.  Patient endorsed intermittent dry cough, sinus headache with occasional nasal drainage but denies any chest pain, fevers, chills, abdominal pain, nausea, vomiting or dizziness.  She admits to smoking 1/2 pack/day cigarettes and has been smoking since the age of [age].  Reports she has attempted to quit a few times with nicotine  patches and lozenges but has not been successful yet.   ED Course: Initial vitals show patient afebrile, RR 18-22, HR 60-70s, SBP 120-170s, drop in SpO2 to 88% on room air, placed on 2 L [place]. Initial labs significant for sodium 125 otherwise normal renal function, normal CBC, normal troponin, negative flu, RSV and COVID test. CXR with no active disease.  Pt received IV NS 1 L bolus, IV Solu-Medrol , multiple DuoNebs, IV Benadryl  and Percocet. TRH was consulted for admission.   Assessment & Plan: # Acute respiratory failure with hypoxia - Presented with persistent wheezing and shortness of breath - Found to have new O2 requirement of 2 L [place]-->not wearing O2 at time of my exam.  Feels subjectively better, but  not to baseline - This is secondary to acute COPD exacerbation - continue neb treatments as below, follow O2 levels.  If remains off o/n tonight, likely able to DC home tomorrow 9/20 - Incentive spirometer, flutter valve   # COPD exacerbation - Presented with persistent wheezing, shortness of breath and increased cough - Better aeration of lungs on exam today. - CXR with no evidence of PNA; Neg covid, RSV and flu test -Now on prednisone  and daily azithromycin  500 mg x 3 doses - Continue home bronchodilator - As needed duonebs-->   # Acute hyponatremia - Sodium of 125--> up to 131 today on exam. -Treating as hypovolemic hyponatremia.  IV has infiltrated she can continue to hydrate herself orally. -Follow sodium -Urine sodium 33, osmolality 119-->dilute.  Not SIADH   # Chronic back pain - S/p lumbar decompression and fusion in March currently undergoing outpatient PT - Continue pain with control Robaxin  and as needed Percocet   # Bipolar disorder # Anxiety and depression # OCD - Patient is slightly anxious during her evaluation - Continue Klonopin , Trileptal  and Pristiq  (Effexor  XR as substitute)   # Gout - Continue allopurinol  and colchicine    # ADHD - Hold Adderall during hospitalization   # GERD - Continue PPI   # Tobacco use disorder - Patient reports smoking 1/2 ppd of cigarettes and has been smoking for about 40 years - Reports she has attempted smoking cessation and has not been successful but willing to try again - Agreeable to nicotine  patch, order placed-->feels better with this in place.   DVT  prophylaxis:  enoxaparin  (LOVENOX ) injection 40 mg Start: 06/06/24 1000  Code Status:   Code Status: Full Code Family Communication: Father and husband at bedside, all questions answered Level of care: Telemetry Medical Status is: Inpatient Dispo:  likely able to DC 9/20 if continues to improve.     Subjective: Feels better than admission but asking for breathing  treatment now.  Objective: Vitals:   06/06/24 0026 06/06/24 0451 06/06/24 0613 06/06/24 0830  BP: (!) 154/74 (!) 180/103 (!) 143/111 (!) 138/97  Pulse: 74 81 77 84  Resp:  18  19  Temp:  (!) 97.5 F (36.4 C)  98.7 F (37.1 C)  TempSrc:      SpO2: 93% 93%  94%  Weight:      Height:        Intake/Output Summary (Last 24 hours) at 06/06/2024 1212 Last data filed at 06/05/2024 2316 Gross per 24 hour  Intake 1000 ml  Output --  Net 1000 ml   Filed Weights   06/05/24 1318  Weight: 86.2 kg   Body mass index is 32.61 kg/m.  Gen: 48 y.o. female in no apparent distress.  Nontoxic, appears older than stated age. Pulm: Non-labored breathing.  Better aeration bilateral lower lung lobes.  Some scattered wheezing upper lobes CV: Regular rate and rhythm. No murmur, rub, or gallop. No JVD GI: Abdomen soft, non-tender, non-distended, with normoactive bowel sounds. No organomegaly or masses felt. Ext: Warm, no deformities, no pedal edema Skin: No rashes, lesions no ulcers Neuro: Alert and oriented. No focal neurological deficits. Psych: Calm  Judgement and insight appear normal. Mood & affect appropriate.     I have personally reviewed the following labs and images: CBC: Recent Labs  Lab 06/05/24 1347 06/06/24 0455  WBC 6.8 4.2  NEUTROABS 3.4  --   HGB 12.8 13.4  HCT 39.3 40.9  MCV 90.1 88.5  PLT 358 359   BMP &GFR Recent Labs  Lab 06/05/24 1347 06/06/24 0455  NA 125* 131*  K 4.3 4.2  CL 85* 95*  CO2 30 28  GLUCOSE 99 154*  BUN 14 10  CREATININE 0.89 0.88  CALCIUM  8.9 9.2   Estimated Creatinine Clearance: 82.2 mL/min (by C-G formula based on SCr of 0.88 mg/dL). Liver & Pancreas: No results for input(s): AST, ALT, ALKPHOS, BILITOT, PROT, ALBUMIN  in the last 168 hours. No results for input(s): LIPASE, AMYLASE in the last 168 hours. No results for input(s): AMMONIA in the last 168 hours. Diabetic: No results for input(s): HGBA1C in the last 72  hours. No results for input(s): GLUCAP in the last 168 hours. Cardiac Enzymes: No results for input(s): CKTOTAL, CKMB, CKMBINDEX, TROPONINI in the last 168 hours. No results for input(s): PROBNP in the last 8760 hours. Coagulation Profile: No results for input(s): INR, PROTIME in the last 168 hours. Thyroid Function Tests: No results for input(s): TSH, T4TOTAL, FREET4, T3FREE, THYROIDAB in the last 72 hours. Lipid Profile: No results for input(s): CHOL, HDL, LDLCALC, TRIG, CHOLHDL, LDLDIRECT in the last 72 hours. Anemia Panel: No results for input(s): VITAMINB12, FOLATE, FERRITIN, TIBC, IRON , RETICCTPCT in the last 72 hours. Urine analysis:    Component Value Date/Time   COLORURINE YELLOW 02/27/2020 2224   APPEARANCEUR HAZY (A) 02/27/2020 2224   LABSPEC 1.029 02/27/2020 2224   PHURINE 5.0 02/27/2020 2224   GLUCOSEU NEGATIVE 02/27/2020 2224   HGBUR MODERATE (A) 02/27/2020 2224   HGBUR negative 02/27/2008 1525   BILIRUBINUR NEGATIVE 02/27/2020 2224   KETONESUR NEGATIVE 02/27/2020  2224   PROTEINUR 30 (A) 02/27/2020 2224   UROBILINOGEN 0.2 06/25/2011 1356   NITRITE NEGATIVE 02/27/2020 2224   LEUKOCYTESUR NEGATIVE 02/27/2020 2224   Sepsis Labs: Invalid input(s): PROCALCITONIN, LACTICIDVEN  Microbiology: Recent Results (from the past 240 hours)  Resp panel by RT-PCR (RSV, Flu A&B, Covid) Anterior Nasal Swab     Status: None   Collection Time: 06/05/24  9:22 PM   Specimen: Anterior Nasal Swab  Result Value Ref Range Status   SARS Coronavirus 2 by RT PCR NEGATIVE NEGATIVE Final   Influenza A by PCR NEGATIVE NEGATIVE Final   Influenza B by PCR NEGATIVE NEGATIVE Final    Comment: (NOTE) The Xpert Xpress SARS-CoV-2/FLU/RSV plus assay is intended as an aid in the diagnosis of influenza from Nasopharyngeal swab specimens and should not be used as a sole basis for treatment. Nasal washings and aspirates are unacceptable for Xpert  Xpress SARS-CoV-2/FLU/RSV testing.  Fact Sheet for Patients: BloggerCourse.com  Fact Sheet for Healthcare Providers: SeriousBroker.it  This test is not yet approved or cleared by the [place]  FDA and has been authorized for detection and/or diagnosis of SARS-CoV-2 by FDA under an Emergency Use Authorization (EUA). This EUA will remain in effect (meaning this test can be used) for the duration of the COVID-19 declaration under Section 564(b)(1) of the Act, 21 U.S.C. section 360bbb-3(b)(1), unless the authorization is terminated or revoked.     Resp Syncytial Virus by PCR NEGATIVE NEGATIVE Final    Comment: (NOTE) Fact Sheet for Patients: BloggerCourse.com  Fact Sheet for Healthcare Providers: SeriousBroker.it  This test is not yet approved or cleared by the [place]  FDA and has been authorized for detection and/or diagnosis of SARS-CoV-2 by FDA under an Emergency Use Authorization (EUA). This EUA will remain in effect (meaning this test can be used) for the duration of the COVID-19 declaration under Section 564(b)(1) of the Act, 21 U.S.C. section 360bbb-3(b)(1), unless the authorization is terminated or revoked.  Performed at [facility], [address], [place], [place] [address]   Respiratory (~20 pathogens) panel by PCR     Status: None   Collection Time: 06/06/24  1:30 AM   Specimen: Nasopharyngeal Swab; Respiratory  Result Value Ref Range Status   Adenovirus NOT DETECTED NOT DETECTED Final   Coronavirus 229E NOT DETECTED NOT DETECTED Final    Comment: (NOTE) The Coronavirus on the Respiratory Panel, DOES NOT test for the novel  Coronavirus (2019 nCoV)    Coronavirus HKU1 NOT DETECTED NOT DETECTED Final   Coronavirus NL63 NOT DETECTED NOT DETECTED Final   Coronavirus OC43 NOT DETECTED NOT DETECTED Final   Metapneumovirus NOT DETECTED NOT DETECTED  Final   Rhinovirus / Enterovirus NOT DETECTED NOT DETECTED Final   Influenza A NOT DETECTED NOT DETECTED Final   Influenza B NOT DETECTED NOT DETECTED Final   Parainfluenza Virus 1 NOT DETECTED NOT DETECTED Final   Parainfluenza Virus 2 NOT DETECTED NOT DETECTED Final   Parainfluenza Virus 3 NOT DETECTED NOT DETECTED Final   Parainfluenza Virus 4 NOT DETECTED NOT DETECTED Final   Respiratory Syncytial Virus NOT DETECTED NOT DETECTED Final   Bordetella pertussis NOT DETECTED NOT DETECTED Final   Bordetella Parapertussis NOT DETECTED NOT DETECTED Final   Chlamydophila pneumoniae NOT DETECTED NOT DETECTED Final   Mycoplasma pneumoniae NOT DETECTED NOT DETECTED Final    Comment: Performed at [facility], [address], [place], [place] [address]    Radiology Studies: DG Chest 1 View Result  Date: 06/05/2024 CLINICAL DATA:  Shortness of breath. EXAM: CHEST  1 VIEW COMPARISON:  October 08, 2014. FINDINGS: The heart size and mediastinal contours are within normal limits. Both lungs are clear. The visualized skeletal structures are unremarkable. IMPRESSION: No active disease. Electronically Signed   By: Lynwood Landy Raddle M.D.   On: 06/05/2024 14:08    Scheduled Meds:  allopurinol   100 mg Oral Daily   azithromycin   500 mg Oral Daily   budesonide -glycopyrrolate -formoterol   2 puff Inhalation BID   clonazePAM   1 mg Oral BID   colchicine   0.6 mg Oral Daily   enoxaparin  (LOVENOX ) injection  40 mg Subcutaneous Daily   methocarbamol   1,500 mg Oral QHS   nicotine   14 mg Transdermal Daily   Oxcarbazepine   300 mg Oral QHS   pantoprazole   40 mg Oral Daily   predniSONE   40 mg Oral Q breakfast   venlafaxine  XR  150 mg Oral Q breakfast   Continuous Infusions:   LOS: 1 day   35 minutes with more than 50% spent in reviewing records, counseling patient/family and coordinating care.  Reyes VEAR Gaw, MD [facility] [email or web address] 06/06/2024, 12:12 PM

## 2024-06-06 NOTE — Plan of Care (Addendum)
  Problem: Clinical Measurements: Goal: Ability to maintain clinical measurements within normal limits will improve Outcome: Progressing Goal: Will remain free from infection Outcome: Progressing Goal: Diagnostic test results will improve Outcome: Progressing   Problem: Respiratory: Goal: Ability to maintain a clear airway will improve Outcome: Progressing

## 2024-06-07 DIAGNOSIS — J9601 Acute respiratory failure with hypoxia: Secondary | ICD-10-CM | POA: Diagnosis not present

## 2024-06-07 LAB — CBC
HCT: 34.4 % — ABNORMAL LOW (ref 36.0–46.0)
Hemoglobin: 11.1 g/dL — ABNORMAL LOW (ref 12.0–15.0)
MCH: 29.6 pg (ref 26.0–34.0)
MCHC: 32.3 g/dL (ref 30.0–36.0)
MCV: 91.7 fL (ref 80.0–100.0)
Platelets: 208 K/uL (ref 150–400)
RBC: 3.75 MIL/uL — ABNORMAL LOW (ref 3.87–5.11)
RDW: 14.2 % (ref 11.5–15.5)
WBC: 12.3 K/uL — ABNORMAL HIGH (ref 4.0–10.5)
nRBC: 0 % (ref 0.0–0.2)

## 2024-06-07 LAB — BASIC METABOLIC PANEL WITH GFR
Anion gap: 9 (ref 5–15)
BUN: 11 mg/dL (ref 6–20)
CO2: 26 mmol/L (ref 22–32)
Calcium: 8.6 mg/dL — ABNORMAL LOW (ref 8.9–10.3)
Chloride: 96 mmol/L — ABNORMAL LOW (ref 98–111)
Creatinine, Ser: 0.68 mg/dL (ref 0.44–1.00)
GFR, Estimated: >60 mL/min (ref >60)
Glucose, Bld: 93 mg/dL (ref 70–99)
Potassium: 4.6 mmol/L (ref 3.5–5.1)
Sodium: 131 mmol/L — ABNORMAL LOW (ref 135–145)

## 2024-06-07 MED ORDER — COLCHICINE 0.6 MG PO TABS: 0.6000 mg | ORAL_TABLET | Freq: Every day | ORAL | 3 refills | Status: AC | PRN

## 2024-06-07 MED ORDER — AZITHROMYCIN 250 MG PO TABS: ORAL_TABLET | ORAL | 0 refills | Status: DC

## 2024-06-07 MED ORDER — PREDNISONE 20 MG PO TABS: 40.0000 mg | ORAL_TABLET | Freq: Every day | ORAL | 0 refills | Status: DC

## 2024-06-07 NOTE — Discharge Summary (Signed)
 Physician Discharge Summary   Patient: Nancy Bowers MRN: [id number] DOB: 1976/03/13  Admit date:     06/05/2024  Discharge date: 06/07/24  Discharge Physician: Reyes VEAR Gaw   PCP: [facility]   Recommendations at discharge:   Patient discharged home with 1 further day of azithromycin  for total of 3 days 500 mg daily.  Also discharged home with 4 more days of prednisone , 40 mg a day. Recommended to schedule her nebulizer treatments every 4-6 hours for the next 2 days and then starting on Monday to move to as needed dosing. Recommended to not take her colchicine  while she is taking the azithromycin  because of the risk of colchicine  metabolites building up.  Discussed she can restart this on a as needed basis on Monday as her last dose of azithromycin  will be tomorrow Sunday. Patient's sodium stayed steady at 131 prior to discharge.  Recommended to follow-up with her PCP next week to have a recheck of her sodium to ensure it has normalized.  Discharge Diagnoses: Principal Problem:   Acute respiratory failure with hypoxia (HCC) Active Problems:   Anxiety and depression   COPD exacerbation (HCC)   Hyponatremia   Chronic low back pain   Chronic gout without tophus   Gastroesophageal reflux disease  Resolved Problems:   * No resolved hospital problems. *  Hospital Course: 48 y.o. female with medical history significant for COPD, bipolar disorder, ADHD, anxiety and depression, OCD, arthritis, gout and chronic back pain s/p lumbar fusion who was sent by PCP for evaluation of hypoxia and shortness of breath.  Patient reports she had a flu shot on September 9 and since then, she has had persistent wheezing and shortness of breath.  She reports having some erythema around the injection site that has improved after taking Benadryl .  She has been attempting her breathing treatment at night with some relief before bed however her symptoms returns during the day.  Due to her  persistent wheezing and SOB, she followed up with her PCP today. Patient became hypoxic during evaluation and she was advised to present to the ED.  Patient endorsed intermittent dry cough, sinus headache with occasional nasal drainage but denies any chest pain, fevers, chills, abdominal pain, nausea, vomiting or dizziness.  She admits to smoking 1/2 pack/day cigarettes and has been smoking since the age of [age].  Reports she has attempted to quit a few times with nicotine  patches and lozenges but has not been successful yet.   Due to persistent hypoxia while in the emergency department to O2 sats of 88%, she was admitted for further observation.  Continued on scheduled DuoNeb nebulizers.  On hospital day 2 she no longer needed oxygen and her O2 sats remained normal throughout the rest of her hospital stay.  She was also observed to have a sodium of 125 while in the emergency department.  Started on IV fluids normal saline.  These continued for 24 hours.  She had infiltration of her IV and because of the pain her IV was causing her she asked to not have this replaced.  On hospital day #3, she continued to show improvement and her wheezing was almost completely resolved.  Her sodium remained steady at 131 and due to her overall improvement and her eagerness to be discharged, she was discharged home on 06/07/2024 with scheduled DuoNebs throughout the weekend and recommendations to follow-up with her PCP early next week to ensure that her sodium has returned back to normal.  Also counseled  to quit smoking completely and continue with nicotine  patches.  These worked for her while she was in house.   Assessment & Plan: # Acute respiratory failure with hypoxia - Presented with persistent wheezing and shortness of breath - Found to have new O2 requirement of 2 L  - Hypoxia resolved and patient back to breathing baseline at discharge.   # COPD exacerbation - Presented with persistent wheezing, shortness of breath  and increased cough - Better aeration of lungs on exam today. - CXR with no evidence of PNA; Neg covid, RSV and flu test -Now on prednisone  and daily azithromycin  500 mg x 3 doses - Will continue prednisone  at discharge as well as 1 more day of azithromycin . - Scheduled DuoNebs as above through the weekend.  Moved to as needed on Monday.   # Acute hyponatremia - Sodium of 125--> up to 131 today on exam. -Treating as hypovolemic hyponatremia.  IV has infiltrated she can continue to hydrate herself orally. - Sodium 131 again today same as yesterday. - Follow-up next week to ensure it is completely resolved.  Patient complained of severe pain with her IV and declined having this put back in.  # Chronic back pain - S/p lumbar decompression and fusion in March currently undergoing outpatient PT - Continue pain with control Robaxin  and as needed Percocet   # Bipolar disorder # Anxiety and depression # OCD - Patient is slightly anxious during her evaluation - Continue Klonopin , Trileptal  and Pristiq  (Effexor  XR as substitute)   # Gout - Continue allopurinol  and colchicine  -See above instructions, holding colchicine  while on azithromycin  and can restart as needed on Monday.   # ADHD - Hold Adderall during hospitalization   # GERD - Continue PPI   # Tobacco use disorder - Patient reports smoking 1/2 ppd of cigarettes and has been smoking for about 40 years - Reports she has attempted smoking cessation and has not been successful but willing to try again - Agreeable to nicotine  patch, order placed-->feels better with this in place.  -Recommended to continue outpatient      Consultants: None Procedures performed: None Disposition: Home Diet recommendation:  Discharge Diet Orders (From admission, onward)     Start     Ordered   06/07/24 0000  Diet - low sodium heart healthy        06/07/24 1008           Regular diet DISCHARGE MEDICATION: Allergies as of 06/07/2024        Reactions   Aspirin Anaphylaxis   Bee Venom Hives   Fentanyl  Rash        Medication List     TAKE these medications    albuterol  108 (90 Base) MCG/ACT inhaler Commonly known as: VENTOLIN  HFA Inhale 1-2 puffs into the lungs every 4 (four) hours as needed for wheezing or shortness of breath.   allopurinol  100 MG tablet Commonly known as: ZYLOPRIM  TAKE 1 TABLET (100 MG TOTAL) BY MOUTH DAILY.   amphetamine -dextroamphetamine  30 MG tablet Commonly known as: ADDERALL Take 30 mg by mouth 2 (two) times daily.   azithromycin  250 MG tablet Commonly known as: ZITHROMAX  Take 2 tabs orally 9/21 then stop. Start taking on: June 08, 2024   clonazePAM  1 MG tablet Commonly known as: KLONOPIN  Take 1 mg by mouth 2 (two) times daily.   colchicine  0.6 MG tablet Take 1 tablet (0.6 mg total) by mouth daily as needed (gout attack). What changed:  when to take this reasons  to take this   desvenlafaxine  100 MG 24 hr tablet Commonly known as: PRISTIQ  Take 1 tablet (100 mg total) by mouth daily. What changed: when to take this   ferrous sulfate  325 (65 FE) MG EC tablet Take 325 mg by mouth daily with breakfast.   fluticasone  50 MCG/ACT nasal spray Commonly known as: FLONASE  Place 1 spray into both nostrils daily as needed for allergies or rhinitis.   gabapentin  800 MG tablet Commonly known as: NEURONTIN  Take 800 mg by mouth 3 (three) times daily.   ipratropium-albuterol  0.5-2.5 (3) MG/3ML Soln Commonly known as: DUONEB Inhale 3 mLs into the lungs every 6 (six) hours as needed (Shortness of breath).   ivabradine 5 MG Tabs tablet Commonly known as: CORLANOR Take 5 mg by mouth 2 (two) times daily.   loratadine  10 MG tablet Commonly known as: CLARITIN  Take 10 mg by mouth at bedtime.   meloxicam  15 MG tablet Commonly known as: MOBIC  Take 15 mg by mouth daily.   methocarbamol  500 MG tablet Commonly known as: ROBAXIN  Take 1,000 mg by mouth 3 (three) times daily.    montelukast 10 MG tablet Commonly known as: SINGULAIR Take 10 mg by mouth at bedtime.   nicotine  21 mg/24hr patch Commonly known as: NICODERM CQ  - dosed in mg/24 hours Place 21 mg onto the skin daily.   nicotine  polacrilex 2 MG lozenge Commonly known as: COMMIT Take 2 mg by mouth as needed for smoking cessation.   omeprazole 40 MG capsule Commonly known as: PRILOSEC Take 40 mg by mouth daily.   OVER THE COUNTER MEDICATION Apply 1 Application topically as needed (nerve pain). Nervive Roll On Pain Relief   Oxcarbazepine  300 MG tablet Commonly known as: TRILEPTAL  Take 300 mg by mouth at bedtime.   oxyCODONE -acetaminophen  10-325 MG tablet Commonly known as: PERCOCET Take 1 tablet by mouth See admin instructions. Take 1 tablet every three hours while awake.   predniSONE  20 MG tablet Commonly known as: DELTASONE  Take 2 tablets (40 mg total) by mouth daily with breakfast. X 4 more days.  Please ignore prior prescription, should be 4 days total = 8 pills. Start taking on: June 08, 2024   Trelegy Ellipta 200-62.5-25 MCG/ACT Aepb Generic drug: Fluticasone -Umeclidin-Vilant Inhale 1 puff into the lungs 2 (two) times daily.   Vitamin D  (Ergocalciferol ) 1.25 MG (50000 UNIT) Caps capsule Commonly known as: DRISDOL  Take 50,000 Units by mouth once a week.        Discharge Exam: Filed Weights   06/05/24 1318  Weight: 86.2 kg   Gen: 48 y.o. female in no apparent distress.  Nontoxic, appears older than stated age. Pulm: Non-labored breathing.  Better aeration bilateral lower lung lobes without any notable wheezing on exam today. CV: Regular rate and rhythm. No murmur, rub, or gallop. No JVD GI: Abdomen soft, non-tender, non-distended Ext: Warm, no deformities, no pedal edema Skin: No rashes, lesions no ulcers Neuro: Alert and oriented. No focal neurological deficits. Psych: Calm  Judgement and insight appear normal. Mood & affect appropriate.    Condition at discharge:  good  The results of significant diagnostics from this hospitalization (including imaging, microbiology, ancillary and laboratory) are listed below for reference.   Imaging Studies: DG Chest 1 View Result Date: 06/05/2024 CLINICAL DATA:  Shortness of breath. EXAM: CHEST  1 VIEW COMPARISON:  October 08, 2014. FINDINGS: The heart size and mediastinal contours are within normal limits. Both lungs are clear. The visualized skeletal structures are unremarkable. IMPRESSION: No active disease. Electronically  Signed   By: Lynwood Landy Raddle M.D.   On: 06/05/2024 14:08    Microbiology: Results for orders placed or performed during the hospital encounter of 06/05/24  Resp panel by RT-PCR (RSV, Flu A&B, Covid) Anterior Nasal Swab     Status: None   Collection Time: 06/05/24  9:22 PM   Specimen: Anterior Nasal Swab  Result Value Ref Range Status   SARS Coronavirus 2 by RT PCR NEGATIVE NEGATIVE Final   Influenza A by PCR NEGATIVE NEGATIVE Final   Influenza B by PCR NEGATIVE NEGATIVE Final    Comment: (NOTE) The Xpert Xpress SARS-CoV-2/FLU/RSV plus assay is intended as an aid in the diagnosis of influenza from Nasopharyngeal swab specimens and should not be used as a sole basis for treatment. Nasal washings and aspirates are unacceptable for Xpert Xpress SARS-CoV-2/FLU/RSV testing.  Fact Sheet for Patients: BloggerCourse.com  Fact Sheet for Healthcare Providers: SeriousBroker.it  This test is not yet approved or cleared by the [place]  FDA and has been authorized for detection and/or diagnosis of SARS-CoV-2 by FDA under an Emergency Use Authorization (EUA). This EUA will remain in effect (meaning this test can be used) for the duration of the COVID-19 declaration under Section 564(b)(1) of the Act, 21 U.S.C. section 360bbb-3(b)(1), unless the authorization is terminated or revoked.     Resp Syncytial Virus by PCR NEGATIVE NEGATIVE Final     Comment: (NOTE) Fact Sheet for Patients: BloggerCourse.com  Fact Sheet for Healthcare Providers: SeriousBroker.it  This test is not yet approved or cleared by the [place]  FDA and has been authorized for detection and/or diagnosis of SARS-CoV-2 by FDA under an Emergency Use Authorization (EUA). This EUA will remain in effect (meaning this test can be used) for the duration of the COVID-19 declaration under Section 564(b)(1) of the Act, 21 U.S.C. section 360bbb-3(b)(1), unless the authorization is terminated or revoked.  Performed at [facility], [address] [place], [place] [address]   Respiratory (~20 pathogens) panel by PCR     Status: None   Collection Time: 06/06/24  1:30 AM   Specimen: Nasopharyngeal Swab; Respiratory  Result Value Ref Range Status   Adenovirus NOT DETECTED NOT DETECTED Final   Coronavirus 229E NOT DETECTED NOT DETECTED Final    Comment: (NOTE) The Coronavirus on the Respiratory Panel, DOES NOT test for the novel  Coronavirus (2019 nCoV)    Coronavirus HKU1 NOT DETECTED NOT DETECTED Final   Coronavirus NL63 NOT DETECTED NOT DETECTED Final   Coronavirus OC43 NOT DETECTED NOT DETECTED Final   Metapneumovirus NOT DETECTED NOT DETECTED Final   Rhinovirus / Enterovirus NOT DETECTED NOT DETECTED Final   Influenza A NOT DETECTED NOT DETECTED Final   Influenza B NOT DETECTED NOT DETECTED Final   Parainfluenza Virus 1 NOT DETECTED NOT DETECTED Final   Parainfluenza Virus 2 NOT DETECTED NOT DETECTED Final   Parainfluenza Virus 3 NOT DETECTED NOT DETECTED Final   Parainfluenza Virus 4 NOT DETECTED NOT DETECTED Final   Respiratory Syncytial Virus NOT DETECTED NOT DETECTED Final   Bordetella pertussis NOT DETECTED NOT DETECTED Final   Bordetella Parapertussis NOT DETECTED NOT DETECTED Final   Chlamydophila pneumoniae NOT DETECTED NOT DETECTED Final   Mycoplasma pneumoniae NOT DETECTED NOT DETECTED  Final    Comment: Performed at [facility], [address], [place], [place] [address]    Labs: CBC: Recent Labs  Lab 06/05/24 1347 06/06/24 0455 06/07/24 0329  WBC 6.8 4.2 12.3*  NEUTROABS 3.4  --   --  HGB 12.8 13.4 11.1*  HCT 39.3 40.9 34.4*  MCV 90.1 88.5 91.7  PLT 358 359 208   Basic Metabolic Panel: Recent Labs  Lab 06/05/24 1347 06/06/24 0455 06/07/24 0329  NA 125* 131* 131*  K 4.3 4.2 4.6  CL 85* 95* 96*  CO2 30 28 26   GLUCOSE 99 154* 93  BUN 14 10 11   CREATININE 0.89 0.88 0.68  CALCIUM  8.9 9.2 8.6*   Liver Function Tests: No results for input(s): AST, ALT, ALKPHOS, BILITOT, PROT, ALBUMIN  in the last 168 hours. CBG: No results for input(s): GLUCAP in the last 168 hours.  Discharge time spent: Less than 30 minutes.  Signed: Reyes VEAR Gaw, MD [facility] 06/07/2024

## 2024-07-21 ENCOUNTER — Encounter: Payer: Self-pay | Admitting: Radiology

## 2024-08-05 ENCOUNTER — Other Ambulatory Visit: Payer: Self-pay | Admitting: Orthopedic Surgery

## 2024-08-06 ENCOUNTER — Other Ambulatory Visit: Payer: Self-pay | Admitting: Physician Assistant

## 2024-08-06 DIAGNOSIS — M1A00X0 Idiopathic chronic gout, unspecified site, without tophus (tophi): Secondary | ICD-10-CM

## 2024-08-06 MED ORDER — ALLOPURINOL 100 MG PO TABS: 100.0000 mg | ORAL_TABLET | Freq: Every day | ORAL | 11 refills | Status: AC

## 2024-08-09 ENCOUNTER — Emergency Department (HOSPITAL_COMMUNITY)

## 2024-08-09 ENCOUNTER — Inpatient Hospital Stay (HOSPITAL_COMMUNITY)
Admission: EM | Admit: 2024-08-09 | Discharge: 2024-08-11 | DRG: 192 | Disposition: A | Discharge status: Home / Self Care | Attending: Internal Medicine | Admitting: Internal Medicine

## 2024-08-09 ENCOUNTER — Other Ambulatory Visit: Payer: Self-pay

## 2024-08-09 ENCOUNTER — Encounter (HOSPITAL_COMMUNITY): Payer: Self-pay | Admitting: Radiology

## 2024-08-09 DIAGNOSIS — G894 Chronic pain syndrome: Secondary | ICD-10-CM | POA: Diagnosis present

## 2024-08-09 DIAGNOSIS — F319 Bipolar disorder, unspecified: Secondary | ICD-10-CM | POA: Diagnosis present

## 2024-08-09 DIAGNOSIS — Z885 Allergy status to narcotic agent status: Secondary | ICD-10-CM | POA: Diagnosis not present

## 2024-08-09 DIAGNOSIS — Z89432 Acquired absence of left foot: Secondary | ICD-10-CM

## 2024-08-09 DIAGNOSIS — M1A9XX0 Chronic gout, unspecified, without tophus (tophi): Secondary | ICD-10-CM | POA: Diagnosis not present

## 2024-08-09 DIAGNOSIS — M549 Dorsalgia, unspecified: Secondary | ICD-10-CM | POA: Diagnosis present

## 2024-08-09 DIAGNOSIS — Z91030 Bee allergy status: Secondary | ICD-10-CM

## 2024-08-09 DIAGNOSIS — J441 Chronic obstructive pulmonary disease with (acute) exacerbation: Principal | ICD-10-CM | POA: Diagnosis present

## 2024-08-09 DIAGNOSIS — R0602 Shortness of breath: Secondary | ICD-10-CM | POA: Diagnosis present

## 2024-08-09 DIAGNOSIS — F429 Obsessive-compulsive disorder, unspecified: Secondary | ICD-10-CM | POA: Diagnosis present

## 2024-08-09 DIAGNOSIS — F909 Attention-deficit hyperactivity disorder, unspecified type: Secondary | ICD-10-CM | POA: Diagnosis present

## 2024-08-09 DIAGNOSIS — Z981 Arthrodesis status: Secondary | ICD-10-CM

## 2024-08-09 DIAGNOSIS — M109 Gout, unspecified: Secondary | ICD-10-CM | POA: Diagnosis present

## 2024-08-09 DIAGNOSIS — K219 Gastro-esophageal reflux disease without esophagitis: Secondary | ICD-10-CM | POA: Diagnosis present

## 2024-08-09 DIAGNOSIS — R296 Repeated falls: Secondary | ICD-10-CM | POA: Diagnosis present

## 2024-08-09 DIAGNOSIS — Z791 Long term (current) use of non-steroidal anti-inflammatories (NSAID): Secondary | ICD-10-CM

## 2024-08-09 DIAGNOSIS — F419 Anxiety disorder, unspecified: Secondary | ICD-10-CM | POA: Diagnosis present

## 2024-08-09 DIAGNOSIS — Z79899 Other long term (current) drug therapy: Secondary | ICD-10-CM | POA: Diagnosis not present

## 2024-08-09 DIAGNOSIS — Z7951 Long term (current) use of inhaled steroids: Secondary | ICD-10-CM

## 2024-08-09 DIAGNOSIS — F17200 Nicotine dependence, unspecified, uncomplicated: Secondary | ICD-10-CM | POA: Diagnosis not present

## 2024-08-09 DIAGNOSIS — F17210 Nicotine dependence, cigarettes, uncomplicated: Secondary | ICD-10-CM | POA: Diagnosis present

## 2024-08-09 LAB — URINALYSIS, ROUTINE W REFLEX MICROSCOPIC
Bilirubin Urine: NEGATIVE
Glucose, UA: NEGATIVE mg/dL
Hgb urine dipstick: NEGATIVE
Ketones, ur: NEGATIVE mg/dL
Leukocytes,Ua: NEGATIVE
Nitrite: NEGATIVE
Protein, ur: NEGATIVE mg/dL
Specific Gravity, Urine: 1.006 (ref 1.005–1.030)
pH: 6 (ref 5.0–8.0)

## 2024-08-09 LAB — I-STAT VENOUS BLOOD GAS, ED
Acid-Base Excess: 3 mmol/L — ABNORMAL HIGH (ref 0.0–2.0)
Bicarbonate: 31.4 mmol/L — ABNORMAL HIGH (ref 20.0–28.0)
Calcium, Ion: 1.14 mmol/L — ABNORMAL LOW (ref 1.15–1.40)
HCT: 42 % (ref 36.0–46.0)
Hemoglobin: 14.3 g/dL (ref 12.0–15.0)
O2 Saturation: 70 %
Potassium: 4.2 mmol/L (ref 3.5–5.1)
Sodium: 134 mmol/L — ABNORMAL LOW (ref 135–145)
TCO2: 33 mmol/L — ABNORMAL HIGH (ref 22–32)
pCO2, Ven: 63.7 mmHg — ABNORMAL HIGH (ref 44–60)
pH, Ven: 7.301 (ref 7.25–7.43)
pO2, Ven: 41 mmHg (ref 32–45)

## 2024-08-09 LAB — BASIC METABOLIC PANEL WITH GFR
Anion gap: 15 (ref 5–15)
BUN: 11 mg/dL (ref 6–20)
CO2: 21 mmol/L — ABNORMAL LOW (ref 22–32)
Calcium: 8.4 mg/dL — ABNORMAL LOW (ref 8.9–10.3)
Chloride: 98 mmol/L (ref 98–111)
Creatinine, Ser: 0.85 mg/dL (ref 0.44–1.00)
GFR, Estimated: >60 mL/min (ref >60)
Glucose, Bld: 117 mg/dL — ABNORMAL HIGH (ref 70–99)
Potassium: 4.1 mmol/L (ref 3.5–5.1)
Sodium: 134 mmol/L — ABNORMAL LOW (ref 135–145)

## 2024-08-09 LAB — TROPONIN I (HIGH SENSITIVITY)
Troponin I (High Sensitivity): 5 ng/L (ref <18)
Troponin I (High Sensitivity): 5 ng/L (ref <18)
Troponin I (High Sensitivity): 5 ng/L (ref <18)

## 2024-08-09 LAB — BRAIN NATRIURETIC PEPTIDE: B Natriuretic Peptide: 129.2 pg/mL — ABNORMAL HIGH (ref 0.0–100.0)

## 2024-08-09 LAB — CBC
HCT: 41.1 % (ref 36.0–46.0)
Hemoglobin: 13 g/dL (ref 12.0–15.0)
MCH: 29 pg (ref 26.0–34.0)
MCHC: 31.6 g/dL (ref 30.0–36.0)
MCV: 91.5 fL (ref 80.0–100.0)
Platelets: 296 K/uL (ref 150–400)
RBC: 4.49 MIL/uL (ref 3.87–5.11)
RDW: 13.2 % (ref 11.5–15.5)
WBC: 7.5 K/uL (ref 4.0–10.5)
nRBC: 0 % (ref 0.0–0.2)

## 2024-08-09 LAB — POC URINE PREG, ED: Preg Test, Ur: NEGATIVE

## 2024-08-09 MED ORDER — MONTELUKAST SODIUM 10 MG PO TABS
10.0000 mg | ORAL_TABLET | Freq: Every day | ORAL | Status: DC
  Administered 2024-08-10 (×2): 10 mg via ORAL
  Filled 2024-08-09 (×2): qty 1

## 2024-08-09 MED ORDER — ALBUTEROL SULFATE (2.5 MG/3ML) 0.083% IN NEBU
2.5000 mg | INHALATION_SOLUTION | Freq: Once | RESPIRATORY_TRACT | Status: AC
  Administered 2024-08-09: 2.5 mg via RESPIRATORY_TRACT
  Filled 2024-08-09: qty 3

## 2024-08-09 MED ORDER — ACETAMINOPHEN 650 MG RE SUPP: 650.0000 mg | Freq: Four times a day (QID) | RECTAL | Status: DC | PRN

## 2024-08-09 MED ORDER — METHOCARBAMOL 500 MG PO TABS
1000.0000 mg | ORAL_TABLET | Freq: Three times a day (TID) | ORAL | Status: DC
  Administered 2024-08-10 – 2024-08-11 (×5): 1000 mg via ORAL
  Filled 2024-08-09 (×5): qty 2

## 2024-08-09 MED ORDER — FLUTICASONE PROPIONATE 50 MCG/ACT NA SUSP
1.0000 | Freq: Every day | NASAL | Status: DC | PRN
Start: 2024-08-09 — End: 2024-08-11

## 2024-08-09 MED ORDER — IPRATROPIUM-ALBUTEROL 0.5-2.5 (3) MG/3ML IN SOLN
3.0000 mL | Freq: Once | RESPIRATORY_TRACT | Status: AC
  Administered 2024-08-09: 3 mL via RESPIRATORY_TRACT
  Filled 2024-08-09: qty 3

## 2024-08-09 MED ORDER — FERROUS SULFATE 325 (65 FE) MG PO TABS
325.0000 mg | ORAL_TABLET | Freq: Every evening | ORAL | Status: DC
  Administered 2024-08-10 (×2): 325 mg via ORAL
  Filled 2024-08-09 (×2): qty 1

## 2024-08-09 MED ORDER — COLCHICINE 0.6 MG PO TABS: 0.6000 mg | ORAL_TABLET | Freq: Every day | ORAL | Status: DC

## 2024-08-09 MED ORDER — NAPROXEN 250 MG PO TABS
375.0000 mg | ORAL_TABLET | Freq: Three times a day (TID) | ORAL | Status: DC
  Filled 2024-08-09: qty 2
  Filled 2024-08-09: qty 1
  Filled 2024-08-09 (×4): qty 2

## 2024-08-09 MED ORDER — OXYCODONE-ACETAMINOPHEN 5-325 MG PO TABS
1.0000 | ORAL_TABLET | ORAL | Status: DC | PRN
  Administered 2024-08-10 (×4): 1 via ORAL
  Filled 2024-08-09 (×4): qty 1

## 2024-08-09 MED ORDER — PSEUDOEPHEDRINE HCL ER 120 MG PO TB12
120.0000 mg | ORAL_TABLET | Freq: Every day | ORAL | Status: DC
  Administered 2024-08-10 (×2): 120 mg via ORAL
  Filled 2024-08-09 (×2): qty 1

## 2024-08-09 MED ORDER — ENOXAPARIN SODIUM 40 MG/0.4ML IJ SOSY
40.0000 mg | PREFILLED_SYRINGE | INTRAMUSCULAR | Status: DC
  Administered 2024-08-10 – 2024-08-11 (×2): 40 mg via SUBCUTANEOUS
  Filled 2024-08-09 (×2): qty 0.4

## 2024-08-09 MED ORDER — MAGNESIUM SULFATE 2 GM/50ML IV SOLN
2.0000 g | Freq: Once | INTRAVENOUS | Status: AC
  Administered 2024-08-09: 2 g via INTRAVENOUS
  Filled 2024-08-09: qty 50

## 2024-08-09 MED ORDER — ALLOPURINOL 100 MG PO TABS
100.0000 mg | ORAL_TABLET | Freq: Every day | ORAL | Status: DC
  Administered 2024-08-10 – 2024-08-11 (×2): 100 mg via ORAL
  Filled 2024-08-09 (×2): qty 1

## 2024-08-09 MED ORDER — GABAPENTIN 400 MG PO CAPS
800.0000 mg | ORAL_CAPSULE | Freq: Three times a day (TID) | ORAL | Status: DC
Start: 2024-08-10 — End: 2024-08-11
  Administered 2024-08-10 (×4): 800 mg via ORAL
  Filled 2024-08-09: qty 8
  Filled 2024-08-09 (×3): qty 2

## 2024-08-09 MED ORDER — OXYCODONE-ACETAMINOPHEN 5-325 MG PO TABS
2.0000 | ORAL_TABLET | Freq: Once | ORAL | Status: AC
  Administered 2024-08-09: 2 via ORAL
  Filled 2024-08-09: qty 2

## 2024-08-09 MED ORDER — NICOTINE 21 MG/24HR TD PT24
21.0000 mg | MEDICATED_PATCH | Freq: Every day | TRANSDERMAL | Status: DC
  Administered 2024-08-10 – 2024-08-11 (×3): 21 mg via TRANSDERMAL
  Filled 2024-08-09 (×3): qty 1

## 2024-08-09 MED ORDER — ACETAMINOPHEN 325 MG PO TABS
650.0000 mg | ORAL_TABLET | Freq: Four times a day (QID) | ORAL | Status: DC | PRN
Start: 2024-08-09 — End: 2024-08-11

## 2024-08-09 MED ORDER — LORATADINE-PSEUDOEPHEDRINE ER 10-240 MG PO TB24
1.0000 | ORAL_TABLET | Freq: Every day | ORAL | Status: DC
Start: 2024-08-09 — End: 2024-08-09

## 2024-08-09 MED ORDER — LORATADINE 10 MG PO TABS
10.0000 mg | ORAL_TABLET | Freq: Every day | ORAL | Status: DC
  Administered 2024-08-10 (×2): 10 mg via ORAL
  Filled 2024-08-09 (×2): qty 1

## 2024-08-09 MED ORDER — METHYLPREDNISOLONE SODIUM SUCC 125 MG IJ SOLR
125.0000 mg | Freq: Once | INTRAMUSCULAR | Status: AC
  Administered 2024-08-09: 125 mg via INTRAVENOUS
  Filled 2024-08-09: qty 2

## 2024-08-09 MED ORDER — ONDANSETRON HCL 4 MG PO TABS: 4.0000 mg | ORAL_TABLET | Freq: Four times a day (QID) | ORAL | Status: DC | PRN

## 2024-08-09 MED ORDER — PANTOPRAZOLE SODIUM 40 MG PO TBEC
40.0000 mg | DELAYED_RELEASE_TABLET | Freq: Every day | ORAL | Status: DC
  Administered 2024-08-10 – 2024-08-11 (×2): 40 mg via ORAL
  Filled 2024-08-09 (×2): qty 1

## 2024-08-09 MED ORDER — BUDESON-GLYCOPYRROL-FORMOTEROL 160-9-4.8 MCG/ACT IN AERO
2.0000 | INHALATION_SPRAY | Freq: Two times a day (BID) | RESPIRATORY_TRACT | Status: DC
  Administered 2024-08-10 – 2024-08-11 (×3): 2 via RESPIRATORY_TRACT
  Filled 2024-08-09: qty 5.9

## 2024-08-09 MED ORDER — OXYCODONE-ACETAMINOPHEN 10-325 MG PO TABS
1.0000 | ORAL_TABLET | ORAL | Status: DC | PRN
Start: 2024-08-09 — End: 2024-08-09

## 2024-08-09 MED ORDER — IPRATROPIUM-ALBUTEROL 0.5-2.5 (3) MG/3ML IN SOLN
3.0000 mL | Freq: Four times a day (QID) | RESPIRATORY_TRACT | Status: DC
  Administered 2024-08-09 – 2024-08-11 (×5): 3 mL via RESPIRATORY_TRACT
  Filled 2024-08-09 (×6): qty 3

## 2024-08-09 MED ORDER — ALBUTEROL SULFATE (2.5 MG/3ML) 0.083% IN NEBU
10.0000 mg | INHALATION_SOLUTION | Freq: Once | RESPIRATORY_TRACT | Status: AC
  Administered 2024-08-09: 10 mg via RESPIRATORY_TRACT
  Filled 2024-08-09: qty 12

## 2024-08-09 MED ORDER — SODIUM CHLORIDE 0.9 % IV SOLN
500.0000 mg | Freq: Once | INTRAVENOUS | Status: AC
  Administered 2024-08-09: 500 mg via INTRAVENOUS
  Filled 2024-08-09: qty 5

## 2024-08-09 MED ORDER — VENLAFAXINE HCL ER 150 MG PO CP24
150.0000 mg | ORAL_CAPSULE | Freq: Every day | ORAL | Status: DC
  Administered 2024-08-11: 150 mg via ORAL
  Filled 2024-08-09: qty 1
  Filled 2024-08-09: qty 2

## 2024-08-09 MED ORDER — METHYLPREDNISOLONE SODIUM SUCC 40 MG IJ SOLR
40.0000 mg | Freq: Two times a day (BID) | INTRAMUSCULAR | Status: DC
  Administered 2024-08-10 – 2024-08-11 (×3): 40 mg via INTRAVENOUS
  Filled 2024-08-09 (×3): qty 1

## 2024-08-09 MED ORDER — OXCARBAZEPINE 300 MG PO TABS
300.0000 mg | ORAL_TABLET | Freq: Every day | ORAL | Status: DC
  Administered 2024-08-10 (×2): 300 mg via ORAL
  Filled 2024-08-09 (×2): qty 1

## 2024-08-09 MED ORDER — BUPROPION HCL ER (XL) 150 MG PO TB24
150.0000 mg | ORAL_TABLET | Freq: Every morning | ORAL | Status: DC
  Administered 2024-08-10 – 2024-08-11 (×2): 150 mg via ORAL
  Filled 2024-08-09 (×2): qty 1

## 2024-08-09 MED ORDER — OXYCODONE HCL 5 MG PO TABS
5.0000 mg | ORAL_TABLET | ORAL | Status: DC | PRN
  Administered 2024-08-10 (×5): 5 mg via ORAL
  Filled 2024-08-09 (×6): qty 1

## 2024-08-09 MED ORDER — PROPAFENONE HCL ER 225 MG PO CP12
225.0000 mg | ORAL_CAPSULE | Freq: Two times a day (BID) | ORAL | Status: DC
  Administered 2024-08-10 – 2024-08-11 (×4): 225 mg via ORAL
  Filled 2024-08-09 (×5): qty 1

## 2024-08-09 MED ORDER — CLONAZEPAM 1 MG PO TABS
1.0000 mg | ORAL_TABLET | Freq: Two times a day (BID) | ORAL | Status: DC
  Administered 2024-08-10 – 2024-08-11 (×4): 1 mg via ORAL
  Filled 2024-08-09: qty 1
  Filled 2024-08-09 (×2): qty 2
  Filled 2024-08-09: qty 1

## 2024-08-09 MED ORDER — SODIUM CHLORIDE 0.9 % IV SOLN: 2.0000 g | Freq: Once | INTRAVENOUS | Status: DC

## 2024-08-09 MED ORDER — ONDANSETRON HCL 4 MG/2ML IJ SOLN: 4.0000 mg | Freq: Four times a day (QID) | INTRAMUSCULAR | Status: DC | PRN

## 2024-08-09 MED ORDER — SENNOSIDES-DOCUSATE SODIUM 8.6-50 MG PO TABS: 1.0000 | ORAL_TABLET | Freq: Every evening | ORAL | Status: DC | PRN

## 2024-08-09 MED ORDER — BISACODYL 5 MG PO TBEC: 5.0000 mg | DELAYED_RELEASE_TABLET | Freq: Every day | ORAL | Status: DC | PRN

## 2024-08-09 NOTE — ED Provider Notes (Signed)
 [place] EMERGENCY DEPARTMENT AT [facility] Provider Note   CSN: [id number] Arrival date & time: 08/09/24  8397     Patient presents with: Shortness of Breath   Nancy Bowers is a 49 y.o. female.  {Add pertinent medical, surgical, social history, OB history to HPI:32947}  Shortness of Breath Hx of anxiety, depression COPD exacerbation, currently back pain, GERD presenting with shortness of breath. Hx per pt. Reports SOB since 10/30, taking albuterol  inhaler q4h w/o improvement.  Denies fever or chills.  Endorses that she talk with her father today, told her come get evaluated in the ER.  She also reports chronic back pain, which has been flaring up on her today.  Not currently on O2 at home.  Endorses she has a nebulizer at home but has not been using it.  She also endorses chest pressure and pain starting up today.  Denies pleuritic pain.  Endorses dry cough, nonproductive.  On chart review, patient was recently admitted for SOB, COPD exacerbation 06/04/2024.  Admitted for new O2 requirement, desatting in the ED.  Hyponatremic at that time, received fluids and management.  Improved, discharged on day 3.     Prior to Admission medications   Medication Sig Start Date End Date Taking? Authorizing Provider  albuterol  (PROVENTIL  HFA;VENTOLIN  HFA) 108 (90 Base) MCG/ACT inhaler Inhale 1-2 puffs into the lungs every 4 (four) hours as needed for wheezing or shortness of breath.    [provider]  allopurinol  (ZYLOPRIM ) 100 MG tablet Take 1 tablet (100 mg total) by mouth daily. 08/06/24   Gerome Maurilio HERO, PA-C  amphetamine -dextroamphetamine  (ADDERALL) 30 MG tablet Take 30 mg by mouth 2 (two) times daily. 01/25/22   [provider]  azithromycin  (ZITHROMAX ) 250 MG tablet Take 2 tabs orally 9/21 then stop. 06/08/24   Elpidio Reyes DEL, MD  clonazePAM  (KLONOPIN ) 1 MG tablet Take 1 mg by mouth 2 (two) times daily. 11/20/23   [provider]  colchicine  0.6 MG  tablet Take 1 tablet (0.6 mg total) by mouth daily as needed (gout attack). 06/07/24   Elpidio Reyes DEL, MD  desvenlafaxine  (PRISTIQ ) 100 MG 24 hr tablet Take 1 tablet (100 mg total) by mouth daily. Patient taking differently: Take 100 mg by mouth at bedtime. 02/24/21   Brutus Dales, MD  ferrous sulfate  325 (65 FE) MG EC tablet Take 325 mg by mouth daily with breakfast.    [provider]  fluticasone  (FLONASE ) 50 MCG/ACT nasal spray Place 1 spray into both nostrils daily as needed for allergies or rhinitis.    [provider]  Fluticasone -Umeclidin-Vilant (TRELEGY ELLIPTA) 200-62.5-25 MCG/ACT AEPB Inhale 1 puff into the lungs 2 (two) times daily.    [provider]  gabapentin  (NEURONTIN ) 800 MG tablet Take 800 mg by mouth 3 (three) times daily.    [provider]  ipratropium-albuterol  (DUONEB) 0.5-2.5 (3) MG/3ML SOLN Inhale 3 mLs into the lungs every 6 (six) hours as needed (Shortness of breath). 04/23/24   [provider]  ivabradine (CORLANOR) 5 MG TABS tablet Take 5 mg by mouth 2 (two) times daily. 05/20/24   [provider]  loratadine  (CLARITIN ) 10 MG tablet Take 10 mg by mouth at bedtime.    [provider]  meloxicam  (MOBIC ) 15 MG tablet Take 15 mg by mouth daily. 01/12/22   [provider]  methocarbamol  (ROBAXIN ) 500 MG tablet Take 1,000 mg by mouth 3 (three) times daily. 01/12/22   [provider]  montelukast  (SINGULAIR )  10 MG tablet Take 10 mg by mouth at bedtime. 05/28/24   [provider]  nicotine  (NICODERM CQ  - DOSED IN MG/24 HOURS) 21 mg/24hr patch Place 21 mg onto the skin daily.    [provider]  nicotine  polacrilex (COMMIT) 2 MG lozenge Take 2 mg by mouth as needed for smoking cessation.    [provider]  omeprazole (PRILOSEC) 40 MG capsule Take 40 mg by mouth daily. 09/17/23   [provider]  OVER THE COUNTER MEDICATION Apply 1 Application topically as needed  (nerve pain). Nervive Roll On Pain Relief    [provider]  Oxcarbazepine  (TRILEPTAL ) 300 MG tablet Take 300 mg by mouth at bedtime. 11/20/23   [provider]  oxyCODONE -acetaminophen  (PERCOCET) 10-325 MG tablet Take 1 tablet by mouth See admin instructions. Take 1 tablet every three hours while awake.    [provider]  predniSONE  (DELTASONE ) 20 MG tablet Take 2 tablets (40 mg total) by mouth daily with breakfast. X 4 more days.  Please ignore prior prescription, should be 4 days total = 8 pills. 06/08/24   Elpidio Reyes DEL, MD  Vitamin D , Ergocalciferol , (DRISDOL ) 1.25 MG (50000 UNIT) CAPS capsule Take 50,000 Units by mouth once a week.    [provider]    Allergies: Aspirin, Bee venom, and Fentanyl     Review of Systems  Respiratory:  Positive for shortness of breath.     Updated Vital Signs BP (!) 138/90 (BP Location: Left Arm)   Pulse 85   Temp 97.7 F (36.5 C)   Resp 20   Ht 5' 4 (1.626 m)   Wt 86.2 kg   LMP  (LMP Unknown) Comment: Patient states last menstrual cycle was sometime in 2024 but she is not sure. Per the patient, her PCP told her she was in menopause. Patient also had a tubal ligations. Urine preg test negative on 12/07/23  SpO2 90%   BMI 32.61 kg/m   Physical Exam Vitals and nursing note reviewed.  Constitutional:      General: She is in acute distress.     Appearance: She is ill-appearing.  HENT:     Head: Normocephalic and atraumatic.  Cardiovascular:     Rate and Rhythm: Regular rhythm.     Pulses: Normal pulses.     Heart sounds: Normal heart sounds. No murmur heard.    No gallop.  Pulmonary:     Effort: Tachypnea and respiratory distress present.     Breath sounds: Decreased breath sounds and wheezing present. No rhonchi.  Abdominal:     Palpations: Abdomen is soft.  Musculoskeletal:     Right lower leg: No tenderness. No edema.     Left lower leg: No tenderness. No edema.  Skin:    General: Skin is warm.      Capillary Refill: Capillary refill takes less than 2 seconds.  Neurological:     General: No focal deficit present.     Mental Status: She is alert and oriented to person, place, and time.     (all labs ordered are listed, but only abnormal results are displayed) Labs Reviewed  BASIC METABOLIC PANEL WITH GFR - Abnormal; Notable for the following components:      Result Value   Sodium 134 (*)    CO2 21 (*)    Glucose, Bld 117 (*)    Calcium  8.4 (*)    All other components within normal limits  CBC  BRAIN NATRIURETIC PEPTIDE  BLOOD GAS,  VENOUS  URINALYSIS, ROUTINE W REFLEX MICROSCOPIC  POC URINE PREG, ED  TROPONIN I (HIGH SENSITIVITY)    EKG: None  Radiology: DG Chest 2 View Result Date: 08/09/2024 CLINICAL DATA:  Shortness of breath EXAM: CHEST - 2 VIEW COMPARISON:  06/05/2024 FINDINGS: Frontal and lateral views of the chest demonstrate an unremarkable cardiac silhouette. No acute airspace disease, effusion, or pneumothorax. No acute bony abnormality. IMPRESSION: 1. No acute intrathoracic process. Electronically Signed   By: Ozell Daring M.D.   On: 08/09/2024 18:45    {Document cardiac monitor, telemetry assessment procedure when appropriate:32947} Procedures   Medications Ordered in the ED  ipratropium-albuterol  (DUONEB) 0.5-2.5 (3) MG/3ML nebulizer solution 3 mL (has no administration in time range)  methylPREDNISolone  sodium succinate (SOLU-MEDROL ) 125 mg/2 mL injection 125 mg (has no administration in time range)  albuterol  (PROVENTIL ) (2.5 MG/3ML) 0.083% nebulizer solution 2.5 mg (has no administration in time range)      {Click here for ABCD2, HEART and other calculators REFRESH Note before signing:1}                              Medical Decision Making Amount and/or Complexity of Data Reviewed Labs: ordered. Radiology: ordered.  Risk Prescription drug management.   ***  {Document critical care time when appropriate  Document review of labs and  clinical decision tools ie CHADS2VASC2, etc  Document your independent review of radiology images and any outside records  Document your discussion with family members, caretakers and with consultants  Document social determinants of health affecting pt's care  Document your decision making why or why not admission, treatments were needed:32947:::1}   Final diagnoses:  None    ED Discharge Orders     None

## 2024-08-09 NOTE — ED Triage Notes (Signed)
 Patient reports that since 10/30 she has been coughing and having trouble breathing. She reports she is on a heart pill but doesn't know what it is.She reports she has also been nauseous and vomiting. She states she is also having chest pain in the central location. She is reporting pain in her left leg.   Denies abdominal pain.   PMH: COPD
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
## 2024-08-09 NOTE — ED Triage Notes (Signed)
 She has been also having N/V/D. She takes albuterol , duoneb treatment, and Trelagy at home.

## 2024-08-09 NOTE — ED Notes (Signed)
 CCMD called for cardiac monitoring @21 :04

## 2024-08-09 NOTE — ED Notes (Signed)
 Pateint seen being wheeled by her visitor up to the road. Patient did not notify staff of the intention to leave or if she would be back.

## 2024-08-10 DIAGNOSIS — G894 Chronic pain syndrome: Secondary | ICD-10-CM

## 2024-08-10 DIAGNOSIS — F319 Bipolar disorder, unspecified: Secondary | ICD-10-CM

## 2024-08-10 DIAGNOSIS — J441 Chronic obstructive pulmonary disease with (acute) exacerbation: Secondary | ICD-10-CM | POA: Diagnosis not present

## 2024-08-10 LAB — RESPIRATORY PANEL BY PCR

## 2024-08-10 LAB — BASIC METABOLIC PANEL WITH GFR
Anion gap: 4 — ABNORMAL LOW (ref 5–15)
BUN: 8 mg/dL (ref 6–20)
CO2: 34 mmol/L — ABNORMAL HIGH (ref 22–32)
Calcium: 9 mg/dL (ref 8.9–10.3)
Chloride: 97 mmol/L — ABNORMAL LOW (ref 98–111)
Creatinine, Ser: 0.67 mg/dL (ref 0.44–1.00)
GFR, Estimated: >60 mL/min (ref >60)
Glucose, Bld: 166 mg/dL — ABNORMAL HIGH (ref 70–99)
Potassium: 4 mmol/L (ref 3.5–5.1)
Sodium: 135 mmol/L (ref 135–145)

## 2024-08-10 LAB — CBC
HCT: 40 % (ref 36.0–46.0)
Hemoglobin: 13.2 g/dL (ref 12.0–15.0)
MCH: 29.2 pg (ref 26.0–34.0)
MCHC: 33 g/dL (ref 30.0–36.0)
MCV: 88.5 fL (ref 80.0–100.0)
Platelets: 296 K/uL (ref 150–400)
RBC: 4.52 MIL/uL (ref 3.87–5.11)
RDW: 13.3 % (ref 11.5–15.5)
WBC: 5.4 K/uL (ref 4.0–10.5)
nRBC: 0 % (ref 0.0–0.2)

## 2024-08-10 MED ORDER — AZITHROMYCIN 500 MG PO TABS
500.0000 mg | ORAL_TABLET | Freq: Every day | ORAL | Status: DC
  Administered 2024-08-10 – 2024-08-11 (×2): 500 mg via ORAL
  Filled 2024-08-10: qty 2
  Filled 2024-08-10: qty 1

## 2024-08-10 MED ORDER — IPRATROPIUM-ALBUTEROL 0.5-2.5 (3) MG/3ML IN SOLN: 3.0000 mL | Freq: Four times a day (QID) | RESPIRATORY_TRACT | Status: DC | PRN

## 2024-08-10 MED ORDER — SENNOSIDES-DOCUSATE SODIUM 8.6-50 MG PO TABS
1.0000 | ORAL_TABLET | Freq: Every day | ORAL | Status: DC
  Administered 2024-08-10: 1 via ORAL
  Filled 2024-08-10: qty 1

## 2024-08-10 MED ORDER — CLONIDINE HCL 0.1 MG PO TABS
0.1000 mg | ORAL_TABLET | Freq: Two times a day (BID) | ORAL | Status: DC
  Administered 2024-08-10 – 2024-08-11 (×3): 0.1 mg via ORAL
  Filled 2024-08-10 (×3): qty 1

## 2024-08-10 MED ORDER — NICOTINE 21 MG/24HR TD PT24
21.0000 mg | MEDICATED_PATCH | Freq: Once | TRANSDERMAL | Status: DC
  Administered 2024-08-10: 21 mg via TRANSDERMAL
  Filled 2024-08-10: qty 1

## 2024-08-10 MED ORDER — AMPHETAMINE-DEXTROAMPHETAMINE 10 MG PO TABS
30.0000 mg | ORAL_TABLET | Freq: Two times a day (BID) | ORAL | Status: DC
Start: 2024-08-10 — End: 2024-08-11
  Administered 2024-08-10 – 2024-08-11 (×3): 30 mg via ORAL
  Filled 2024-08-10 (×3): qty 3

## 2024-08-10 MED ORDER — HYDRALAZINE HCL 20 MG/ML IJ SOLN
10.0000 mg | Freq: Four times a day (QID) | INTRAMUSCULAR | Status: DC | PRN
  Administered 2024-08-10 – 2024-08-11 (×3): 10 mg via INTRAVENOUS
  Filled 2024-08-10 (×3): qty 1

## 2024-08-10 NOTE — Progress Notes (Signed)
   08/10/24 1703  Assess: MEWS Score  Temp 98.2 F (36.8 C)  BP (!) 163/128  MAP (mmHg) 139  Pulse Rate (!) 114  Resp 20  SpO2 95 %  Assess: MEWS Score  MEWS Temp 0  MEWS Systolic 0  MEWS Pulse 2  MEWS RR 0  MEWS LOC 0  MEWS Score 2  MEWS Score Color Yellow  Assess: if the MEWS score is Yellow or Red  Were vital signs accurate and taken at a resting state? Yes  Does the patient meet 2 or more of the SIRS criteria? No  MEWS guidelines implemented  Yes, yellow  Treat  MEWS Interventions Considered administering scheduled or prn medications/treatments as ordered  Take Vital Signs  Increase Vital Sign Frequency  Yellow: Q2hr x1, continue Q4hrs until patient remains green for 12hrs  Escalate  MEWS: Escalate Yellow: Discuss with charge nurse and consider notifying provider and/or RRT  Provider Notification  Provider Name/Title Pahwani MD  Date Provider Notified 08/10/24  Time Provider Notified 1715  Method of Notification Page  Notification Reason Change in status (MEWS)  Provider response See new orders  Date of Provider Response 08/10/24  Time of Provider Response 1730  Assess: SIRS CRITERIA  SIRS Temperature  0  SIRS Respirations  0  SIRS Pulse 1  SIRS WBC 0  SIRS Score Sum  1

## 2024-08-10 NOTE — ED Notes (Signed)
 Pt provided with sandwich and apple sauce.

## 2024-08-10 NOTE — ED Notes (Signed)
 Pt placed on 2L [place] for comfort and O2 sat in high 80's low 90's. Pt had coughing fit that caused her to wet her pants. Bedding and patient changed.

## 2024-08-10 NOTE — Plan of Care (Signed)
  Problem: Education: Goal: Knowledge of General Education information will improve Description: Including pain rating scale, medication(s)/side effects and non-pharmacologic comfort measures Outcome: Progressing   Problem: Clinical Measurements: Goal: Ability to maintain clinical measurements within normal limits will improve Outcome: Progressing Goal: Will remain free from infection Outcome: Progressing Goal: Respiratory complications will improve Outcome: Progressing   Problem: Activity: Goal: Risk for activity intolerance will decrease Outcome: Progressing   Problem: Nutrition: Goal: Adequate nutrition will be maintained Outcome: Progressing   Problem: Coping: Goal: Level of anxiety will decrease Outcome: Progressing   Problem: Elimination: Goal: Will not experience complications related to bowel motility Outcome: Progressing Goal: Will not experience complications related to urinary retention Outcome: Progressing   Problem: Pain Managment: Goal: General experience of comfort will improve and/or be controlled Outcome: Progressing   Problem: Safety: Goal: Ability to remain free from injury will improve Outcome: Progressing

## 2024-08-10 NOTE — ED Notes (Signed)
 Called for pt transfer

## 2024-08-10 NOTE — Progress Notes (Addendum)
 PROGRESS NOTE    Nancy Bowers  FMW:[id number] DOB: 24-Jul-1976 DOA: 08/09/2024 PCP: [facility]   Brief Narrative:  HPI: Nancy Bowers is a 48 y.o. female with medical history significant for COPD, bipolar disorder, ADHD, anxiety and depression, OCD, arthritis, gout, tobacco use disorder and chronic back pain s/p lumbar fusion who presented to the ED for evaluation of shortness of breath and cough.  Patient reports that 3 weeks ago, she started having persistent shortness of breath and productive cough.  She has been taken OTC medications such as Mucinex  and Robitussin without significant improvement.  Reports she is also use her nebulizer treatments more frequently over the last 2 weeks.  Her shortness of breath and cough worsened today so she presented to the ED for further evaluation. She reports associated wheezing but denies any chest pain, fever, chills, nausea, vomiting, dizziness or abdominal pain.  Admits she is still smoking cigarettes but has cut down to about half a pack of cigarettes per day.  She has been smoking cigarettes since the age of 6 and was smoking 2-3 packs a day at some point.   ED Course: Initial vitals show patient afebrile, HR 80-90s, SBP 120-150s, SpO2 90-94% on room air. Initial labs stable with normal renal function, CBC, flat troponin, BNP mildly elevated to 129, negative pregnancy test and UA with no signs of infection. CXR shows no active disease. Pt received IV Solu-Medrol , IV magnesium , multiple DuoNebs and Percocet. TRH was consulted for admission.   Assessment & Plan:   Principal Problem:   COPD with acute exacerbation (HCC) Active Problems:   Chronic pain syndrome   Bipolar disorder (HCC)  COPD with acute exacerbation: Still does not feel any better than yesterday.  Not hypoxic thankfully.  Very minimal wheezes today.  Continue bronchodilators and azithromycin .   # Chronic back pain - S/p lumbar decompression and fusion in March,  currently undergoing outpatient PT - Continue home Robaxin  and as needed Percocet.  Patient tells me that she is on much more medications than what she is getting here but I do not see that in the med rec.   # Bipolar disorder # Anxiety and depression # OCD - Continue Klonopin , Trileptal  and Pristiq  (Effexor  XR as substitute)   # Gout - Continue allopurinol  and NSAIDs and resume colchicine .  There is no significant drug interaction between azithromycin  and colchicine .   # ADHD - Resume Adderall.   # GERD - Continue PPI   # Allergies - Continue Singulair  and loratadine   Hypertension: Resume clonidine .  Add as needed hydralazine .   # Tobacco use disorder - Reports she is down to half a pack of cigarettes per day.  Patient's boyfriend is also in the room.  There is very strong smell of smoke in the room.  I have discussed tobacco cessation with the patient.  I have counseled the patient regarding the negative impacts of continued tobacco use including but not limited to lung cancer, COPD, and cardiovascular disease.  I have discussed alternatives to tobacco and modalities that may help facilitate tobacco cessation including but not limited to biofeedback, hypnosis, and medications.  Total time spent with tobacco counseling was 5 minutes.  DVT prophylaxis: enoxaparin  (LOVENOX ) injection 40 mg Start: 08/10/24 1000   Code Status: Full Code  Family Communication: Boyfriend present at bedside.  Plan of care discussed with patient in length and he/she verbalized understanding and agreed with it.  Status is: Inpatient Remains inpatient appropriate because: Still very symptomatic  Estimated body mass index is 32.61 kg/m as calculated from the following:   Height as of this encounter: 5' 4 (1.626 m).   Weight as of this encounter: 86.2 kg.    Nutritional Assessment: Body mass index is 32.61 kg/m.[name] Seen by dietician.  I agree with the assessment and plan as outlined below: Nutrition  Status:        . Skin Assessment: I have examined the patient's skin and I agree with the wound assessment as performed by the wound care RN as outlined below:    Consultants:  None  Procedures:  None  Antimicrobials:  Anti-infectives (From admission, onward)    Start     Dose/Rate Route Frequency Ordered Stop   08/10/24 1000  azithromycin  (ZITHROMAX ) tablet 500 mg        500 mg Oral Daily 08/10/24 0126 08/13/24 0959   08/09/24 2045  azithromycin  (ZITHROMAX ) 500 mg in sodium chloride  0.9 % 250 mL IVPB        500 mg 250 mL/hr over 60 Minutes Intravenous  Once 08/09/24 2036 08/09/24 2324   08/09/24 2045  cefTRIAXone (ROCEPHIN) 2 g in sodium chloride  0.9 % 100 mL IVPB  Status:  Discontinued        2 g 200 mL/hr over 30 Minutes Intravenous  Once 08/09/24 2036 08/09/24 2046         Subjective: Patient seen and examined, patient initially sleepy and the boyfriend is also sleeping in the recliner.  When she woke up, she said that she is not feeling any different than yesterday, her major complaint was back pain and mild shortness of breath.  No new complaint.  Objective: Vitals:   08/10/24 0330 08/10/24 0600 08/10/24 0800 08/10/24 1036  BP: (!) 135/91 (!) 131/110 (!) 148/91 (!) 130/103  Pulse: 90 (!) 103 (!) 106 (!) 101  Resp: 11 14 18 16   Temp:  97.8 F (36.6 C)  97.9 F (36.6 C)  TempSrc:  Oral  Oral  SpO2: 90% 91% 98% 97%  Weight:      Height:        Intake/Output Summary (Last 24 hours) at 08/10/2024 1058 Last data filed at 08/09/2024 2324 Gross per 24 hour  Intake 300.23 ml  Output --  Net 300.23 ml   Filed Weights   08/09/24 1641  Weight: 86.2 kg    Examination:  General exam: Appears calm and comfortable but chronically sick Respiratory system: Very mild expiratory wheezes.  No rales or rhonchi. Cardiovascular system: S1 & S2 heard, RRR. No JVD, murmurs, rubs, gallops or clicks. No pedal edema. Gastrointestinal system: Abdomen is nondistended, soft  and nontender. No organomegaly or masses felt. Normal bowel sounds heard. Central nervous system: Alert and oriented. No focal neurological deficits. Extremities: Symmetric 5 x 5 power. Skin: No rashes, lesions or ulcers   Data Reviewed: I have personally reviewed following labs and imaging studies  CBC: Recent Labs  Lab 08/09/24 1643 08/09/24 1920 08/10/24 0500  WBC 7.5  --  5.4  HGB 13.0 14.3 13.2  HCT 41.1 42.0 40.0  MCV 91.5  --  88.5  PLT 296  --  296   Basic Metabolic Panel: Recent Labs  Lab 08/09/24 1643 08/09/24 1920 08/10/24 0500  NA 134* 134* 135  K 4.1 4.2 4.0  CL 98  --  97*  CO2 21*  --  34*  GLUCOSE 117*  --  166*  BUN 11  --  8  CREATININE 0.85  --  0.67  CALCIUM  8.4*  --  9.0   GFR: Estimated Creatinine Clearance: 90.4 mL/min (by C-G formula based on SCr of 0.67 mg/dL). Liver Function Tests: No results for input(s): AST, ALT, ALKPHOS, BILITOT, PROT, ALBUMIN  in the last 168 hours. No results for input(s): LIPASE, AMYLASE in the last 168 hours. No results for input(s): AMMONIA in the last 168 hours. Coagulation Profile: No results for input(s): INR, PROTIME in the last 168 hours. Cardiac Enzymes: No results for input(s): CKTOTAL, CKMB, CKMBINDEX, TROPONINI in the last 168 hours. BNP (last 3 results) No results for input(s): PROBNP in the last 8760 hours. HbA1C: No results for input(s): HGBA1C in the last 72 hours. CBG: No results for input(s): GLUCAP in the last 168 hours. Lipid Profile: No results for input(s): CHOL, HDL, LDLCALC, TRIG, CHOLHDL, LDLDIRECT in the last 72 hours. Thyroid Function Tests: No results for input(s): TSH, T4TOTAL, FREET4, T3FREE, THYROIDAB in the last 72 hours. Anemia Panel: No results for input(s): VITAMINB12, FOLATE, FERRITIN, TIBC, IRON , RETICCTPCT in the last 72 hours. Sepsis Labs: No results for input(s): PROCALCITON, LATICACIDVEN in the last  168 hours.  No results found for this or any previous visit (from the past 240 hours).   Radiology Studies: DG Chest 2 View Result Date: 08/09/2024 CLINICAL DATA:  Shortness of breath EXAM: CHEST - 2 VIEW COMPARISON:  06/05/2024 FINDINGS: Frontal and lateral views of the chest demonstrate an unremarkable cardiac silhouette. No acute airspace disease, effusion, or pneumothorax. No acute bony abnormality. IMPRESSION: 1. No acute intrathoracic process. Electronically Signed   By: Ozell Daring M.D.   On: 08/09/2024 18:45    Scheduled Meds:  allopurinol   100 mg Oral Daily   azithromycin   500 mg Oral Daily   budesonide -glycopyrrolate -formoterol   2 puff Inhalation BID   buPROPion   150 mg Oral q morning   clonazePAM   1 mg Oral BID   enoxaparin  (LOVENOX ) injection  40 mg Subcutaneous Q24H   ferrous sulfate   325 mg Oral QPM   gabapentin   800 mg Oral TID   ipratropium-albuterol   3 mL Nebulization Q6H   loratadine   10 mg Oral QHS   And   pseudoephedrine   120 mg Oral QHS   methocarbamol   1,000 mg Oral TID   methylPREDNISolone  (SOLU-MEDROL ) injection  40 mg Intravenous Q12H   montelukast   10 mg Oral QHS   naproxen   375 mg Oral TID WC   nicotine   21 mg Transdermal Daily   Oxcarbazepine   300 mg Oral QHS   pantoprazole   40 mg Oral Daily   propafenone   225 mg Oral Q12H   senna-docusate  1 tablet Oral QHS   venlafaxine  XR  150 mg Oral Q breakfast   Continuous Infusions:   LOS: 1 day   Fredia Skeeter, MD [facility]  08/10/2024, 10:58 AM   *Please note that this is a verbal dictation therefore any spelling or grammatical errors are due to the Dragon Medical One system interpretation.  Please page via Amion and do not message via secure chat for urgent patient care matters. Secure chat can be used for non urgent patient care matters.  How to contact the [facility] Attending or Consulting provider 7A - 7P or covering provider during after hours 7P -7A, for this patient?  Check the care  team in [facility] and look for a) attending/consulting [facility] provider listed and b) the [facility] team listed. Page or secure chat 7A-7P. Log into [email or web address] and use [place]'s universal password to access. If you do not have the  password, please contact the hospital operator. Locate the [facility] provider you are looking for under [facility] and page to a number that you can be directly reached. If you still have difficulty reaching the provider, please page the [facility] (Director on Call) for the Hospitalists listed on amion for assistance.

## 2024-08-11 ENCOUNTER — Other Ambulatory Visit (HOSPITAL_COMMUNITY): Payer: Self-pay

## 2024-08-11 DIAGNOSIS — J441 Chronic obstructive pulmonary disease with (acute) exacerbation: Secondary | ICD-10-CM | POA: Diagnosis not present

## 2024-08-11 MED ORDER — IPRATROPIUM BROMIDE 0.02 % IN SOLN: 0.5000 mg | Freq: Four times a day (QID) | RESPIRATORY_TRACT | Status: DC | PRN

## 2024-08-11 MED ORDER — LORATADINE-PSEUDOEPHEDRINE ER 10-240 MG PO TB24
1.0000 | ORAL_TABLET | Freq: Every day | ORAL | 0 refills | Status: DC
  Filled 2024-08-11: qty 15, 15d supply, fill #0

## 2024-08-11 MED ORDER — PREDNISONE 10 MG PO TABS
ORAL_TABLET | ORAL | 0 refills | Status: AC
  Filled 2024-08-11: qty 30, 12d supply, fill #0

## 2024-08-11 MED ORDER — OXYCODONE-ACETAMINOPHEN 5-325 MG PO TABS
1.0000 | ORAL_TABLET | ORAL | Status: DC | PRN
  Administered 2024-08-11 (×2): 2 via ORAL
  Filled 2024-08-11 (×2): qty 2

## 2024-08-11 MED ORDER — FLUCONAZOLE 100 MG PO TABS
100.0000 mg | ORAL_TABLET | ORAL | 0 refills | Status: AC
  Filled 2024-08-11: qty 2, 14d supply, fill #0

## 2024-08-11 MED ORDER — HYDROXYZINE HCL 10 MG/5ML PO SYRP: 10.0000 mg | ORAL_SOLUTION | Freq: Once | ORAL | Status: DC

## 2024-08-11 MED ORDER — HYDROXYZINE HCL 10 MG PO TABS
10.0000 mg | ORAL_TABLET | Freq: Once | ORAL | Status: AC
  Administered 2024-08-11: 10 mg via ORAL
  Filled 2024-08-11: qty 1

## 2024-08-11 MED ORDER — PREDNISONE 10 MG PO TABS: ORAL_TABLET | ORAL | 0 refills | Status: DC

## 2024-08-11 MED ORDER — LEVALBUTEROL HCL 0.63 MG/3ML IN NEBU
0.6300 mg | INHALATION_SOLUTION | Freq: Four times a day (QID) | RESPIRATORY_TRACT | Status: DC
  Administered 2024-08-11: 0.63 mg via RESPIRATORY_TRACT
  Filled 2024-08-11 (×2): qty 3

## 2024-08-11 MED ORDER — FLUCONAZOLE 100 MG PO TABS: 100.0000 mg | ORAL_TABLET | ORAL | 0 refills | Status: DC

## 2024-08-11 MED ORDER — LORATADINE-PSEUDOEPHEDRINE ER 10-240 MG PO TB24: 1.0000 | ORAL_TABLET | Freq: Every day | ORAL | 0 refills | Status: AC

## 2024-08-11 MED ORDER — OXYCODONE HCL 5 MG PO TABS
5.0000 mg | ORAL_TABLET | ORAL | Status: DC | PRN
  Administered 2024-08-11 (×2): 5 mg via ORAL
  Filled 2024-08-11 (×2): qty 1

## 2024-08-11 MED ORDER — LEVALBUTEROL HCL 0.63 MG/3ML IN NEBU: 0.6300 mg | INHALATION_SOLUTION | Freq: Four times a day (QID) | RESPIRATORY_TRACT | Status: DC | PRN

## 2024-08-11 MED ORDER — AZITHROMYCIN 500 MG PO TABS: 500.0000 mg | ORAL_TABLET | Freq: Every day | ORAL | 0 refills | Status: DC

## 2024-08-11 MED ORDER — IPRATROPIUM BROMIDE 0.02 % IN SOLN
0.5000 mg | Freq: Four times a day (QID) | RESPIRATORY_TRACT | Status: DC
  Administered 2024-08-11: 0.5 mg via RESPIRATORY_TRACT
  Filled 2024-08-11: qty 2.5

## 2024-08-11 MED ORDER — AZITHROMYCIN 500 MG PO TABS
500.0000 mg | ORAL_TABLET | Freq: Every day | ORAL | 0 refills | Status: AC
  Filled 2024-08-11: qty 3, 3d supply, fill #0

## 2024-08-11 NOTE — TOC Transition Note (Signed)
 Transition of Care [facility]) - Discharge Note   Patient Details  Name: Nancy Bowers MRN: [id number] Date of Birth: 1976-05-14  [facility] of [facility]) CM/SW Contact:  Bascom Service, RN Phone Number: 08/11/2024, 10:53 AM   Clinical Narrative:  Spoke to patient about d/c plans. Patient prefers a shower chair-MD aware of order. No preference for dme-[employer] rep Jermaine to deliver shower chair to rm prior d/c. Patient has PCP,pharmacy. Informed that she has a case worker through her insurance to asst w/transportation for PCP appts, & additional services  going lobbyist # on AVS, Dept [employer] Social Services on AVS also.[name] Has transport home.      Final next level of care: Home/Self Care Barriers to Discharge: No Barriers Identified   Patient Goals and CMS Choice Patient states their goals for this hospitalization and ongoing recovery are:: Home CMS Medicare.gov Compare Post Acute Care list provided to:: Patient Choice offered to / list presented to : Patient [place] ownership interest in [facility].provided to:: Patient    Discharge Placement                       Discharge Plan and Services Additional resources added to the After Visit Summary for     Discharge Planning Services: CM Consult Post Acute Care Choice: Durable Medical Equipment          DME Arranged: Shower stool DME Agency: [employer] Date DME Agency Contacted: 08/11/24 Time DME Agency Contacted: 1053 Representative spoke with at DME Agency: [place]            Social Drivers of Health (SDOH) Interventions SDOH Screenings   Food Insecurity: Food Insecurity Present (08/10/2024)  Housing: Low Risk  (08/10/2024)  Transportation Needs: Unmet Transportation Needs (08/10/2024)  Utilities: Not At Risk (08/10/2024)  Depression (PHQ2-9): Medium Risk (02/24/2021)  Financial Resource Strain: Medium Risk (07/26/2017)  Physical Activity: Inactive (07/26/2017)  Social  Connections: Socially Isolated (06/06/2024)  Stress: Stress Concern Present (07/26/2017)  Tobacco Use: High Risk (08/09/2024)     Readmission Risk Interventions     No data to display

## 2024-08-11 NOTE — Progress Notes (Addendum)
       Overnight   NAME: Nancy Bowers MRN: [id number] DOB : [birth year]    Date of Service   08/11/2024   HPI/Events of Note   RN notified due to tachycardia on exertion and anxiety.   HPI: 48 y.o. female with medical history significant for COPD, bipolar disorder, ADHD, anxiety and depression, OCD, arthritis, gout, tobacco use disorder and chronic back pain s/p lumbar fusion who presented to the ED for evaluation of shortness of breath and cough.  Patient reports that 3 weeks ago, she started having persistent shortness of breath and productive cough. Pt received multiple doses Duonebs and solumedrol and IV mag in the ED.  Interventions/ Plan   Atarax  for anxiety  Oxycodone  changed from 1 tab to 1-2 tabs 2 L [place]        Update:  0322 per rn pt asleep now and resting well.   Nancy Bowers- Aram BSN RN CCRN AGACNP-BC Acute Care Nurse Practitioner [facility]

## 2024-08-11 NOTE — Progress Notes (Signed)
 AVS and discharge instructions reviewed w/ patient. Patient verbalized understanding. Medications from outpatient pharmacy delivered to bedside, patient is just awaiting shower chair to be delivered.

## 2024-08-11 NOTE — Progress Notes (Signed)
 Claritin -D is out of stock at outpatient pharmacies, patient will need to purchase OTC. This RN updated patient

## 2024-08-11 NOTE — Discharge Instructions (Signed)
 [facility] MEDICAID PREPAID HEALTH PLAN/[place] MEDICAID [facility]   Subscriber Subscriber #  Nancy Bowers, Nancy Bowers [id number]  Address Phone  [address] [place], [place] [address] [phone]

## 2024-08-11 NOTE — Progress Notes (Signed)
 Patient discharged to home via dad. Shower chair delivered before discharge.

## 2024-08-11 NOTE — Discharge Summary (Signed)
 Physician Discharge Summary  Nancy Bowers FMW:[id number] DOB: [birth year] DOA: 08/09/2024  PCP: [facility]  Admit date: 08/09/2024 Discharge date: 08/11/2024  Admitted From: Home Disposition: Home  Recommendations for Outpatient Follow-up:  Follow up with PCP in 1-2 weeks Please obtain BMP/CBC in one week Follow-up with your sleep studies  Home Health: N/A Equipment/Devices: Bedside,  Discharge Condition: Fair CODE STATUS: Full code Diet recommendation: Low-salt diet  Discharge summary: 48 year old with history of COPD, bipolar disorder, ADHD, anxiety depression, OCD, gout, ongoing smoker, chronic back pain and possible sleep apnea under study at [facility], chronic cough and shortness of breath had worsening shortness of breath for last 3 weeks, recurrent fall so came to the emergency room.  In the emergency room blood pressure is stable.  Heart rate is stable.  94% on room air.  Significantly wheezing so admitted as COPD exacerbation.  Treated with IV steroids, inhalers and steroids and bronchodilator therapy, oral azithromycin  with clinical improvement.  Going home with tapering dose of steroids, she is already optimized on COPD therapy including Breztri  inhaler and albuterol .  Discussed strongly about smoking cessation and she is trying to cut down on it.  Chronic issues including chronic back pain, bipolar disorder, anxiety depression: Patient on multiple medications including Klonopin , Trileptal , Pristiq , Percocet that she will continue. Patient has a lot of underlying psychosocial issues.  Patient was referred to case management and her social worker to help with medications, also with transportations to the clinic.  Currently stable for discharge.   Discharge Diagnoses:  Principal Problem:   COPD with acute exacerbation [facility]) Active Problems:   Chronic pain syndrome   Bipolar disorder [facility])    Discharge Instructions  Discharge Instructions      Diet - low sodium heart healthy   Complete by: As directed    Increase activity slowly   Complete by: As directed       Allergies as of 08/11/2024       Reactions   Aspirin Anaphylaxis   Bee Venom Hives   Fentanyl  Rash        Medication List     TAKE these medications    albuterol  108 (90 Base) MCG/ACT inhaler Commonly known as: VENTOLIN  HFA Inhale 1-2 puffs into the lungs every 4 (four) hours as needed for wheezing or shortness of breath.   allopurinol  100 MG tablet Commonly known as: ZYLOPRIM  Take 1 tablet (100 mg total) by mouth daily.   amphetamine -dextroamphetamine  30 MG tablet Commonly known as: ADDERALL Take 30 mg by mouth 2 (two) times daily.   azithromycin  500 MG tablet Commonly known as: ZITHROMAX  Take 1 tablet (500 mg total) by mouth daily for 3 days.   buPROPion  150 MG 24 hr tablet Commonly known as: WELLBUTRIN  XL Take 150 mg by mouth every morning.   clonazePAM  1 MG tablet Commonly known as: KLONOPIN  Take 1 mg by mouth 2 (two) times daily.   cloNIDine  0.1 MG tablet Commonly known as: CATAPRES  Take 0.1 mg by mouth 2 (two) times daily.   colchicine  0.6 MG tablet Take 1 tablet (0.6 mg total) by mouth daily as needed (gout attack). What changed: when to take this   desvenlafaxine  100 MG 24 hr tablet Commonly known as: PRISTIQ  Take 1 tablet (100 mg total) by mouth daily. What changed: when to take this   ferrous sulfate  325 (65 FE) MG EC tablet Take 325 mg by mouth every evening.   fluconazole  100 MG tablet Commonly known as: Diflucan  Take  1 tablet (100 mg total) by mouth every 7 (seven) days for 2 doses.   fluticasone  50 MCG/ACT nasal spray Commonly known as: FLONASE  Place 1 spray into both nostrils daily as needed for allergies or rhinitis.   gabapentin  800 MG tablet Commonly known as: NEURONTIN  Take 800 mg by mouth in the morning, at noon, in the evening, and at bedtime.   ipratropium-albuterol  0.5-2.5 (3) MG/3ML Soln Commonly  known as: DUONEB Inhale 3 mLs into the lungs every 6 (six) hours as needed (Shortness of breath).   loratadine -pseudoephedrine  10-240 MG 24 hr tablet Commonly known as: CLARITIN -D 24-hour Take 1 tablet by mouth at bedtime.   meloxicam  15 MG tablet Commonly known as: MOBIC  Take 15 mg by mouth daily.   methocarbamol  500 MG tablet Commonly known as: ROBAXIN  Take 1,000 mg by mouth 3 (three) times daily.   montelukast  10 MG tablet Commonly known as: SINGULAIR  Take 10 mg by mouth at bedtime.   nicotine  21 mg/24hr patch Commonly known as: NICODERM CQ  - dosed in mg/24 hours Place 21 mg onto the skin daily.   nicotine  polacrilex 2 MG lozenge Commonly known as: COMMIT Take 2 mg by mouth as needed for smoking cessation.   omeprazole 40 MG capsule Commonly known as: PRILOSEC Take 40 mg by mouth daily.   OVER THE COUNTER MEDICATION Apply 1 Application topically as needed (nerve pain). Nervive Roll On Pain Relief   Oxcarbazepine  300 MG tablet Commonly known as: TRILEPTAL  Take 300 mg by mouth at bedtime.   oxyCODONE -acetaminophen  10-325 MG tablet Commonly known as: PERCOCET Take 1 tablet by mouth See admin instructions. Take 1 tablet every three hours while awake.   predniSONE  10 MG tablet Commonly known as: DELTASONE  Take 4 tablets (40 mg total) by mouth daily for 3 days, THEN 3 tablets (30 mg total) daily for 3 days, THEN 2 tablets (20 mg total) daily for 3 days, THEN 1 tablet (10 mg total) daily for 3 days. Start taking on: August 11, 2024   propafenone  225 MG 12 hr capsule Commonly known as: RYTHMOL  SR Take 225 mg by mouth every 12 (twelve) hours.   Trelegy Ellipta 200-62.5-25 MCG/ACT Aepb Generic drug: Fluticasone -Umeclidin-Vilant Inhale 1 puff into the lungs 2 (two) times daily.   Vitamin D  (Ergocalciferol ) 1.25 MG (50000 UNIT) Caps capsule Commonly known as: DRISDOL  Take 50,000 Units by mouth every Friday.        Allergies  Allergen Reactions   Aspirin  Anaphylaxis   Bee Venom Hives   Fentanyl  Rash    Consultations: None   Procedures/Studies: DG Chest 2 View Result Date: 08/09/2024 CLINICAL DATA:  Shortness of breath EXAM: CHEST - 2 VIEW COMPARISON:  06/05/2024 FINDINGS: Frontal and lateral views of the chest demonstrate an unremarkable cardiac silhouette. No acute airspace disease, effusion, or pneumothorax. No acute bony abnormality. IMPRESSION: 1. No acute intrathoracic process. Electronically Signed   By: Ozell Daring M.D.   On: 08/09/2024 18:45   (Echo, Carotid, EGD, Colonoscopy, ERCP)    Subjective: Patient seen and examined.  Her boyfriend at the bedside.  Asked me whether she is going home.  Patient has multiple complaints.  She looks comfortable breathing.  She has complaints starting from back pain, previous surgeries, pain on her feet.  She feels like her breathing pattern is back to herself and she can do nebulizers at home.  Wants to go home.   Discharge Exam: Vitals:   08/11/24 0756 08/11/24 0814  BP:  (!) 160/95  Pulse:  ROLLEN)  122  Resp:    Temp:    SpO2: 93%    Vitals:   08/11/24 0117 08/11/24 0641 08/11/24 0756 08/11/24 0814  BP: (!) 164/105 (!) 161/108  (!) 160/95  Pulse: (!) 121 (!) 109  (!) 122  Resp: (!) 21 16    Temp: 98.7 F (37.1 C) 98.2 F (36.8 C)    TempSrc: Oral Oral    SpO2: 95% 98% 93%   Weight:      Height:        General: Pt is alert, awake, not in acute distress. Cardiovascular: RRR, S1/S2 +, no rubs, no gallops Respiratory: CTA bilaterally, dry cough present.  No added sounds.  Some upper airway conducted sounds present. Abdominal: Soft, NT, ND, bowel sounds + Extremities: no edema, no cyanosis    The results of significant diagnostics from this hospitalization (including imaging, microbiology, ancillary and laboratory) are listed below for reference.     Microbiology: Recent Results (from the past 240 hours)  Respiratory (~20 pathogens) panel by PCR     Status: None    Collection Time: 08/10/24  7:33 AM   Specimen: Nasopharyngeal Swab; Respiratory  Result Value Ref Range Status   Adenovirus NOT DETECTED NOT DETECTED Final   Coronavirus 229E NOT DETECTED NOT DETECTED Final    Comment: (NOTE) The Coronavirus on the Respiratory Panel, DOES NOT test for the novel  Coronavirus (2019 nCoV)    Coronavirus HKU1 NOT DETECTED NOT DETECTED Final   Coronavirus NL63 NOT DETECTED NOT DETECTED Final   Coronavirus OC43 NOT DETECTED NOT DETECTED Final   Metapneumovirus NOT DETECTED NOT DETECTED Final   Rhinovirus / Enterovirus NOT DETECTED NOT DETECTED Final   Influenza A NOT DETECTED NOT DETECTED Final   Influenza B NOT DETECTED NOT DETECTED Final   Parainfluenza Virus 1 NOT DETECTED NOT DETECTED Final   Parainfluenza Virus 2 NOT DETECTED NOT DETECTED Final   Parainfluenza Virus 3 NOT DETECTED NOT DETECTED Final   Parainfluenza Virus 4 NOT DETECTED NOT DETECTED Final   Respiratory Syncytial Virus NOT DETECTED NOT DETECTED Final   Bordetella pertussis NOT DETECTED NOT DETECTED Final   Bordetella Parapertussis NOT DETECTED NOT DETECTED Final   Chlamydophila pneumoniae NOT DETECTED NOT DETECTED Final   Mycoplasma pneumoniae NOT DETECTED NOT DETECTED Final    Comment: Performed at [facility], [address], [place], [place] [address]     Labs: BNP (last 3 results) Recent Labs    08/09/24 1643  BNP 129.2*   Basic Metabolic Panel: Recent Labs  Lab 08/09/24 1643 08/09/24 1920 08/10/24 0500  NA 134* 134* 135  K 4.1 4.2 4.0  CL 98  --  97*  CO2 21*  --  34*  GLUCOSE 117*  --  166*  BUN 11  --  8  CREATININE 0.85  --  0.67  CALCIUM  8.4*  --  9.0   Liver Function Tests: No results for input(s): AST, ALT, ALKPHOS, BILITOT, PROT, ALBUMIN  in the last 168 hours. No results for input(s): LIPASE, AMYLASE in the last 168 hours. No results for input(s): AMMONIA in the last 168 hours. CBC: Recent Labs  Lab 08/09/24 1643  08/09/24 1920 08/10/24 0500  WBC 7.5  --  5.4  HGB 13.0 14.3 13.2  HCT 41.1 42.0 40.0  MCV 91.5  --  88.5  PLT 296  --  296   Cardiac Enzymes: No results for input(s): CKTOTAL, CKMB, CKMBINDEX, TROPONINI in the last 168 hours. BNP: Invalid input(s): POCBNP  CBG: No results for input(s): GLUCAP in the last 168 hours. D-Dimer No results for input(s): DDIMER in the last 72 hours. Hgb A1c No results for input(s): HGBA1C in the last 72 hours. Lipid Profile No results for input(s): CHOL, HDL, LDLCALC, TRIG, CHOLHDL, LDLDIRECT in the last 72 hours. Thyroid function studies No results for input(s): TSH, T4TOTAL, T3FREE, THYROIDAB in the last 72 hours.  Invalid input(s): FREET3 Anemia work up No results for input(s): VITAMINB12, FOLATE, FERRITIN, TIBC, IRON , RETICCTPCT in the last 72 hours. Urinalysis    Component Value Date/Time   COLORURINE STRAW (A) 08/09/2024 1956   APPEARANCEUR CLEAR 08/09/2024 1956   LABSPEC 1.006 08/09/2024 1956   PHURINE 6.0 08/09/2024 1956   GLUCOSEU NEGATIVE 08/09/2024 1956   HGBUR NEGATIVE 08/09/2024 1956   HGBUR negative 02/27/2008 1525   BILIRUBINUR NEGATIVE 08/09/2024 1956   KETONESUR NEGATIVE 08/09/2024 1956   PROTEINUR NEGATIVE 08/09/2024 1956   UROBILINOGEN 0.2 06/25/2011 1356   NITRITE NEGATIVE 08/09/2024 1956   LEUKOCYTESUR NEGATIVE 08/09/2024 1956   Sepsis Labs Recent Labs  Lab 08/09/24 1643 08/10/24 0500  WBC 7.5 5.4   Microbiology Recent Results (from the past 240 hours)  Respiratory (~20 pathogens) panel by PCR     Status: None   Collection Time: 08/10/24  7:33 AM   Specimen: Nasopharyngeal Swab; Respiratory  Result Value Ref Range Status   Adenovirus NOT DETECTED NOT DETECTED Final   Coronavirus 229E NOT DETECTED NOT DETECTED Final    Comment: (NOTE) The Coronavirus on the Respiratory Panel, DOES NOT test for the novel  Coronavirus (2019 nCoV)    Coronavirus HKU1 NOT  DETECTED NOT DETECTED Final   Coronavirus NL63 NOT DETECTED NOT DETECTED Final   Coronavirus OC43 NOT DETECTED NOT DETECTED Final   Metapneumovirus NOT DETECTED NOT DETECTED Final   Rhinovirus / Enterovirus NOT DETECTED NOT DETECTED Final   Influenza A NOT DETECTED NOT DETECTED Final   Influenza B NOT DETECTED NOT DETECTED Final   Parainfluenza Virus 1 NOT DETECTED NOT DETECTED Final   Parainfluenza Virus 2 NOT DETECTED NOT DETECTED Final   Parainfluenza Virus 3 NOT DETECTED NOT DETECTED Final   Parainfluenza Virus 4 NOT DETECTED NOT DETECTED Final   Respiratory Syncytial Virus NOT DETECTED NOT DETECTED Final   Bordetella pertussis NOT DETECTED NOT DETECTED Final   Bordetella Parapertussis NOT DETECTED NOT DETECTED Final   Chlamydophila pneumoniae NOT DETECTED NOT DETECTED Final   Mycoplasma pneumoniae NOT DETECTED NOT DETECTED Final    Comment: Performed at [facility], [address], [place], [place] [address]     Time coordinating discharge: 32 minutes  SIGNED:   Renato Applebaum, MD  [facility] 08/11/2024, 10:02 AM

## 2024-08-11 NOTE — Progress Notes (Signed)
 Discharge meds in a secure bag delivered to patient's nurse by this RN

## 2025-02-10 ENCOUNTER — Ambulatory Visit: Admitting: Neurology
